# Patient Record
Sex: Female | Born: 1970 | Race: Black or African American | Hispanic: No | Marital: Married | Smoking: Current every day smoker
Health system: Southern US, Community
[De-identification: ages and names within clinical notes are randomized; demographics above are authoritative.]

## PROBLEM LIST (undated history)

## (undated) DIAGNOSIS — I2729 Other secondary pulmonary hypertension: Secondary | ICD-10-CM

## (undated) DIAGNOSIS — J9611 Chronic respiratory failure with hypoxia: Secondary | ICD-10-CM

## (undated) DIAGNOSIS — J449 Chronic obstructive pulmonary disease, unspecified: Secondary | ICD-10-CM

## (undated) DIAGNOSIS — I4891 Unspecified atrial fibrillation: Secondary | ICD-10-CM

## (undated) DIAGNOSIS — Z9981 Dependence on supplemental oxygen: Secondary | ICD-10-CM

## (undated) DIAGNOSIS — D571 Sickle-cell disease without crisis: Secondary | ICD-10-CM

## (undated) DIAGNOSIS — I071 Rheumatic tricuspid insufficiency: Secondary | ICD-10-CM

## (undated) DIAGNOSIS — F17200 Nicotine dependence, unspecified, uncomplicated: Secondary | ICD-10-CM

## (undated) DIAGNOSIS — K6289 Other specified diseases of anus and rectum: Secondary | ICD-10-CM

## (undated) DIAGNOSIS — I509 Heart failure, unspecified: Secondary | ICD-10-CM

## (undated) DIAGNOSIS — J439 Emphysema, unspecified: Secondary | ICD-10-CM

## (undated) DIAGNOSIS — I340 Nonrheumatic mitral (valve) insufficiency: Secondary | ICD-10-CM

## (undated) HISTORY — DX: Dependence on supplemental oxygen: Z99.81

## (undated) HISTORY — DX: Chronic obstructive pulmonary disease, unspecified: J44.9

## (undated) HISTORY — DX: Sickle-cell disease without crisis: D57.1

## (undated) HISTORY — DX: Heart failure, unspecified: I50.9

---

## 1994-12-30 HISTORY — PX: CHOLECYSTECTOMY: SHX55

## 2011-12-31 DIAGNOSIS — I2729 Other secondary pulmonary hypertension: Secondary | ICD-10-CM

## 2011-12-31 HISTORY — DX: Other secondary pulmonary hypertension: I27.29

## 2012-06-09 ENCOUNTER — Emergency Department: Payer: Self-pay | Admitting: Emergency Medicine

## 2012-06-09 LAB — CBC
MCH: 37.6 pg — ABNORMAL HIGH (ref 26.0–34.0)
MCHC: 32.8 g/dL (ref 32.0–36.0)
MCV: 115 fL — ABNORMAL HIGH (ref 80–100)
Platelet: 542 10*3/uL — ABNORMAL HIGH (ref 150–440)
RBC: 3.59 10*6/uL — ABNORMAL LOW (ref 3.80–5.20)
RDW: 18.2 % — ABNORMAL HIGH (ref 11.5–14.5)
WBC: 19.4 10*3/uL — ABNORMAL HIGH (ref 3.6–11.0)

## 2012-06-09 LAB — URINALYSIS, COMPLETE
Bacteria: NONE SEEN
Bilirubin,UR: NEGATIVE
Blood: NEGATIVE
Glucose,UR: NEGATIVE mg/dL (ref 0–75)
Hyaline Cast: 1
Ketone: NEGATIVE
Ph: 6 (ref 4.5–8.0)
Protein: NEGATIVE
Specific Gravity: 1.006 (ref 1.003–1.030)
Squamous Epithelial: 1

## 2012-06-09 LAB — COMPREHENSIVE METABOLIC PANEL
Albumin: 4.2 g/dL (ref 3.4–5.0)
Alkaline Phosphatase: 111 U/L (ref 50–136)
Anion Gap: 9 (ref 7–16)
BUN: 5 mg/dL — ABNORMAL LOW (ref 7–18)
Bilirubin,Total: 1.5 mg/dL — ABNORMAL HIGH (ref 0.2–1.0)
Chloride: 107 mmol/L (ref 98–107)
Co2: 22 mmol/L (ref 21–32)
EGFR (African American): >60
EGFR (Non-African Amer.): >60
Glucose: 98 mg/dL (ref 65–99)
Osmolality: 273 (ref 275–301)
Potassium: 3.4 mmol/L — ABNORMAL LOW (ref 3.5–5.1)
SGPT (ALT): 26 U/L
Sodium: 138 mmol/L (ref 136–145)
Total Protein: 7.6 g/dL (ref 6.4–8.2)

## 2012-06-09 LAB — PREGNANCY, URINE: Pregnancy Test, Urine: NEGATIVE m[IU]/mL

## 2012-06-23 ENCOUNTER — Ambulatory Visit: Payer: Self-pay

## 2012-07-01 ENCOUNTER — Ambulatory Visit: Payer: Self-pay

## 2013-12-13 ENCOUNTER — Ambulatory Visit: Payer: Self-pay

## 2014-09-26 ENCOUNTER — Emergency Department: Payer: Self-pay | Admitting: Emergency Medicine

## 2014-09-26 LAB — CBC WITH DIFFERENTIAL/PLATELET
EOS PCT: 2 %
HCT: 45.1 % (ref 35.0–47.0)
HGB: 14.9 g/dL (ref 12.0–16.0)
Lymphocytes: 28 %
MCH: 38.3 pg — ABNORMAL HIGH (ref 26.0–34.0)
MCHC: 33.1 g/dL (ref 32.0–36.0)
MCV: 116 fL — ABNORMAL HIGH (ref 80–100)
Monocytes: 4 %
NRBC/100 WBC: 1 /
Platelet: 449 10*3/uL — ABNORMAL HIGH (ref 150–440)
RBC: 3.9 10*6/uL (ref 3.80–5.20)
RDW: 16.7 % — ABNORMAL HIGH (ref 11.5–14.5)
Segmented Neutrophils: 64 %
Variant Lymphocyte - H1-Rlymph: 2 %
WBC: 18 10*3/uL — AB (ref 3.6–11.0)

## 2014-09-26 LAB — COMPREHENSIVE METABOLIC PANEL
AST: 19 U/L (ref 15–37)
Albumin: 4 g/dL (ref 3.4–5.0)
Alkaline Phosphatase: 95 U/L
Anion Gap: 9 (ref 7–16)
BUN: 5 mg/dL — AB (ref 7–18)
Bilirubin,Total: 0.9 mg/dL (ref 0.2–1.0)
Calcium, Total: 8.8 mg/dL (ref 8.5–10.1)
Chloride: 109 mmol/L — ABNORMAL HIGH (ref 98–107)
Co2: 24 mmol/L (ref 21–32)
Creatinine: 0.56 mg/dL — ABNORMAL LOW (ref 0.60–1.30)
EGFR (Non-African Amer.): >60
Glucose: 89 mg/dL (ref 65–99)
Osmolality: 280 (ref 275–301)
Potassium: 3.7 mmol/L (ref 3.5–5.1)
SGPT (ALT): 20 U/L
SODIUM: 142 mmol/L (ref 136–145)
Total Protein: 7.5 g/dL (ref 6.4–8.2)

## 2014-09-26 LAB — URINALYSIS, COMPLETE
Bacteria: NONE SEEN
Bilirubin,UR: NEGATIVE
Blood: NEGATIVE
GLUCOSE, UR: NEGATIVE mg/dL (ref 0–75)
KETONE: NEGATIVE
Leukocyte Esterase: NEGATIVE
Nitrite: NEGATIVE
Ph: 6 (ref 4.5–8.0)
Protein: NEGATIVE
Specific Gravity: 1.009 (ref 1.003–1.030)
Squamous Epithelial: 2
WBC UR: NONE SEEN /HPF (ref 0–5)

## 2014-09-26 LAB — LIPASE, BLOOD: LIPASE: 143 U/L (ref 73–393)

## 2015-06-13 ENCOUNTER — Encounter: Payer: Self-pay | Admitting: General Surgery

## 2015-06-21 ENCOUNTER — Ambulatory Visit (INDEPENDENT_AMBULATORY_CARE_PROVIDER_SITE_OTHER): Payer: 59 | Admitting: General Surgery

## 2015-06-21 ENCOUNTER — Encounter: Payer: Self-pay | Admitting: General Surgery

## 2015-06-21 VITALS — BP 112/82 | HR 92 | Resp 18 | Ht 60.0 in | Wt 182.0 lb

## 2015-06-21 DIAGNOSIS — K6289 Other specified diseases of anus and rectum: Secondary | ICD-10-CM

## 2015-06-21 DIAGNOSIS — R109 Unspecified abdominal pain: Secondary | ICD-10-CM

## 2015-06-21 LAB — POC HEMOCCULT BLD/STL (OFFICE/1-CARD/DIAGNOSTIC): Fecal Occult Blood, POC: NEGATIVE

## 2015-06-21 NOTE — Patient Instructions (Addendum)
The patient is aware to call back for any questions or concerns.  Endorectal Ultrasound.

## 2015-06-21 NOTE — Progress Notes (Signed)
Patient ID: Shannon Schneider, female   DOB: [birth year], 44 y.o.   MRN: [id number]  Chief Complaint  Patient presents with  . Other    abnormal CT    HPI Shannon Schneider is a 44 y.o. female.  Here today to discuss an abnormal CT. She had left lower quadrant pain on/off since 2013. She does state that the pain is worse when her period ends. The pain last about 1-3 days. Radiates to her back. She states there is a lot of pressure. If the pain last 2 or more days she does have nausea with vomiting. She is normally constipated but the fiber supplements has helped. Bowels move 3/week, no bleeding. She has been to the ED on several occasions. 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 Follow ed by Dr Laurey Morale, ultrasound pending.   HPI  Past Medical History  Diagnosis Date  . Sickle cell anemia     Past Surgical History  Procedure Laterality Date  . Cesarean section  1989  . Cholecystectomy  1996    Family History  Problem Relation Age of Onset  . Diabetes Mother   . Hypertension Mother     Social History History  Substance Use Topics  . Smoking status: Current Every Day Smoker -- 1.00 packs/day for 20 years    Types: Cigarettes  . Smokeless tobacco: Never Used  . Alcohol Use: 0.0 oz/week    0 Standard drinks or equivalent per week     Comment: occasionally    No Known Allergies  Current Outpatient Prescriptions  Medication Sig Dispense Refill  . budesonide-formoterol (SYMBICORT) 160-4.5 MCG/ACT inhaler Inhale 2 puffs into the lungs 2 (two) times daily.    Marland Kitchen dicyclomine (BENTYL) 10 MG capsule     . FIBER PO Take by mouth daily.    . Multiple Vitamin (MULTIVITAMIN) capsule Take 1 capsule by mouth daily.    . predniSONE (DELTASONE) 10 MG tablet as directed.     . polyethylene glycol powder (GLYCOLAX/MIRALAX) powder 255 grams one bottle for colonoscopy prep 255 g 0   No current facility-administered medications for this visit.    Review of Systems Review  of Systems  Constitutional: Negative.   HENT: Negative.   Eyes: Negative.   Respiratory: Negative.   Cardiovascular: Negative.   Gastrointestinal: Positive for nausea, vomiting and abdominal pain. Negative for diarrhea and constipation.  Endocrine: Negative.   Genitourinary: Negative for difficulty urinating.  Allergic/Immunologic: Negative.   Neurological: Negative.   Hematological: Negative.   Psychiatric/Behavioral: Negative.     Blood pressure 112/82, pulse 92, resp. rate 18, height 5' (1.524 m), weight 182 lb (82.555 kg), last menstrual period 06/12/2015.  Physical Exam Physical Exam  Constitutional: She is oriented to person, place, and time. She appears well-developed and well-nourished.  HENT:  Mouth/Throat: Oropharynx is clear and moist.  Eyes: Conjunctivae are normal. No scleral icterus.  Neck: Neck supple.  Cardiovascular: Normal rate, regular rhythm and normal heart sounds.   Pulses:      Dorsalis pedis pulses are 2+ on the right side, and 2+ on the left side.       Posterior tibial pulses are 2+ on the right side, and 2+ on the left side.  Lower leg edema  Pulmonary/Chest: Effort normal and breath sounds normal.  Abdominal: Soft. Normal appearance and bowel sounds are normal. There is no hepatomegaly. There is no tenderness. A hernia is present.  Umbilical hernia  Genitourinary:  Lymphadenopathy:    She has no cervical adenopathy.       Right: No inguinal adenopathy present.       Left: No inguinal adenopathy present.  Neurological: She is alert and oriented to person, place, and time.  Skin: Skin is warm and dry.  Psychiatric: She has a normal mood and affect.    Data Reviewed CT of the abdomen and pelvis completed [facility] in 2013 was reviewed. The patient has a significantly enlarged uterus consistent with leiomyoma as well as bilateral adnexal masses. Retrospective review of the perirectal area on the right suggests is an ill-defined fullness less  prominent than on her current exam.  CT of the abdomen and pelvis dated 05/29/2015 completed for left lower quadrant pain completed at [place] was independently reviewed. There is a well-defined 2.4 x 2.6 x 3.7 cm hypo dense mass to the right of the rectum just above the leave 8 or area. Uterine enlargement was noted. Subcentimeter renal cyst noted.  Assessment    Perirectal mass, likely benign based on clinical exam and radiologic imaging.  History of sickle cell disease with no crisis events in over 10 years in spite of ongoing smoking.    Plan    Arrangements were made for an endorectal ultrasound to better characterize the lesion. It is presently asymptomatic.     Endorectal Ultrasound.  PCP/Ref:  Kasandra Knudsen Dr. Wynona Luna, [place] Gleason 06/22/2015, 6:40 PM

## 2015-06-22 ENCOUNTER — Telehealth: Payer: Self-pay

## 2015-06-22 ENCOUNTER — Other Ambulatory Visit: Payer: Self-pay | Admitting: *Deleted

## 2015-06-22 DIAGNOSIS — R109 Unspecified abdominal pain: Secondary | ICD-10-CM | POA: Insufficient documentation

## 2015-06-22 DIAGNOSIS — K6289 Other specified diseases of anus and rectum: Secondary | ICD-10-CM | POA: Insufficient documentation

## 2015-06-22 MED ORDER — POLYETHYLENE GLYCOL 3350 17 GM/SCOOP PO POWD: ORAL | Status: DC

## 2015-06-22 NOTE — Telephone Encounter (Signed)
  Oncology Nurse Navigator Documentation  Oncology Nurse Navigator Flowsheets 06/22/2015  Referral date to RadOnc/MedOnc 06/22/2015  Navigator Encounter Type Telephone  Coordination of Care EUS  Time Spent with Patient 15   [place] appropriate voicemail left. Will try again Monday to schedule EUS. Date will be 07/13/15

## 2015-06-26 ENCOUNTER — Telehealth: Payer: Self-pay

## 2015-06-26 NOTE — Telephone Encounter (Signed)
EUS scheduled for July 14th. Instructions also mailed to home address

## 2015-06-27 ENCOUNTER — Inpatient Hospital Stay
Admission: EM | Admit: 2015-06-27 | Discharge: 2015-06-29 | DRG: 193 | Disposition: A | Discharge status: Home / Self Care | Payer: Commercial Managed Care - HMO | Attending: Internal Medicine | Admitting: Internal Medicine

## 2015-06-27 ENCOUNTER — Emergency Department: Payer: Commercial Managed Care - HMO

## 2015-06-27 ENCOUNTER — Encounter: Payer: Self-pay | Admitting: Emergency Medicine

## 2015-06-27 DIAGNOSIS — J9601 Acute respiratory failure with hypoxia: Secondary | ICD-10-CM | POA: Diagnosis present

## 2015-06-27 DIAGNOSIS — D571 Sickle-cell disease without crisis: Secondary | ICD-10-CM | POA: Diagnosis present

## 2015-06-27 DIAGNOSIS — Z7951 Long term (current) use of inhaled steroids: Secondary | ICD-10-CM

## 2015-06-27 DIAGNOSIS — J45909 Unspecified asthma, uncomplicated: Secondary | ICD-10-CM | POA: Diagnosis present

## 2015-06-27 DIAGNOSIS — R0600 Dyspnea, unspecified: Secondary | ICD-10-CM | POA: Diagnosis present

## 2015-06-27 DIAGNOSIS — I340 Nonrheumatic mitral (valve) insufficiency: Secondary | ICD-10-CM | POA: Diagnosis present

## 2015-06-27 DIAGNOSIS — I429 Cardiomyopathy, unspecified: Secondary | ICD-10-CM | POA: Diagnosis present

## 2015-06-27 DIAGNOSIS — J189 Pneumonia, unspecified organism: Principal | ICD-10-CM | POA: Diagnosis present

## 2015-06-27 DIAGNOSIS — J449 Chronic obstructive pulmonary disease, unspecified: Secondary | ICD-10-CM

## 2015-06-27 DIAGNOSIS — J181 Lobar pneumonia, unspecified organism: Secondary | ICD-10-CM

## 2015-06-27 DIAGNOSIS — Z9981 Dependence on supplemental oxygen: Secondary | ICD-10-CM

## 2015-06-27 DIAGNOSIS — R609 Edema, unspecified: Secondary | ICD-10-CM

## 2015-06-27 DIAGNOSIS — F17210 Nicotine dependence, cigarettes, uncomplicated: Secondary | ICD-10-CM | POA: Diagnosis present

## 2015-06-27 DIAGNOSIS — I509 Heart failure, unspecified: Secondary | ICD-10-CM

## 2015-06-27 HISTORY — DX: Dependence on supplemental oxygen: Z99.81

## 2015-06-27 HISTORY — DX: Chronic obstructive pulmonary disease, unspecified: J44.9

## 2015-06-27 HISTORY — DX: Heart failure, unspecified: I50.9

## 2015-06-27 LAB — RETICULOCYTES
RBC.: 3.47 MIL/uL — AB (ref 3.80–5.20)
Retic Count, Absolute: 527.4 10*3/uL — ABNORMAL HIGH (ref 19.0–183.0)
Retic Ct Pct: 15.2 % — ABNORMAL HIGH (ref 0.4–3.1)

## 2015-06-27 LAB — BASIC METABOLIC PANEL
ANION GAP: 8 (ref 5–15)
BUN: 6 mg/dL (ref 6–20)
CO2: 24 mmol/L (ref 22–32)
CREATININE: 0.49 mg/dL (ref 0.44–1.00)
Calcium: 8.7 mg/dL — ABNORMAL LOW (ref 8.9–10.3)
Chloride: 107 mmol/L (ref 101–111)
GFR calc Af Amer: >60 mL/min (ref >60)
GLUCOSE: 152 mg/dL — AB (ref 65–99)
Potassium: 3.3 mmol/L — ABNORMAL LOW (ref 3.5–5.1)
Sodium: 139 mmol/L (ref 135–145)

## 2015-06-27 LAB — URINALYSIS COMPLETE WITH MICROSCOPIC (ARMC ONLY)
Bilirubin Urine: NEGATIVE
Glucose, UA: NEGATIVE mg/dL
HGB URINE DIPSTICK: NEGATIVE
Ketones, ur: NEGATIVE mg/dL
LEUKOCYTES UA: NEGATIVE
NITRITE: NEGATIVE
PH: 6 (ref 5.0–8.0)
Protein, ur: 100 mg/dL — AB
Specific Gravity, Urine: 1.005 (ref 1.005–1.030)

## 2015-06-27 LAB — HEPATIC FUNCTION PANEL
ALBUMIN: 3.5 g/dL (ref 3.5–5.0)
ALT: 29 U/L (ref 14–54)
AST: 25 U/L (ref 15–41)
Alkaline Phosphatase: 94 U/L (ref 38–126)
BILIRUBIN TOTAL: 3.6 mg/dL — AB (ref 0.3–1.2)
Bilirubin, Direct: 1.3 mg/dL — ABNORMAL HIGH (ref 0.1–0.5)
Indirect Bilirubin: 2.3 mg/dL — ABNORMAL HIGH (ref 0.3–0.9)
Total Protein: 6.5 g/dL (ref 6.5–8.1)

## 2015-06-27 LAB — CBC WITH DIFFERENTIAL/PLATELET
BAND NEUTROPHILS: 0 % (ref 0–10)
Basophils Absolute: 0 10*3/uL (ref 0.0–0.1)
Basophils Relative: 0 % (ref 0–1)
Blasts: 0 %
EOS ABS: 0 10*3/uL (ref 0.0–0.7)
EOS PCT: 0 % (ref 0–5)
HEMATOCRIT: 43 % (ref 35.0–47.0)
HEMOGLOBIN: 13.9 g/dL (ref 12.0–16.0)
LYMPHS PCT: 22 % (ref 12–46)
Lymphs Abs: 3.5 10*3/uL (ref 1.0–3.6)
MCH: 40 pg — ABNORMAL HIGH (ref 26.0–34.0)
MCHC: 32.3 g/dL (ref 32.0–36.0)
MCV: 124 fL — AB (ref 80.0–100.0)
METAMYELOCYTES PCT: 0 %
MONO ABS: 0.6 10*3/uL (ref 0.2–0.9)
MONOS PCT: 4 % (ref 3–12)
Myelocytes: 0 %
NRBC: 103 /100{WBCs} — AB
Neutro Abs: 11.8 10*3/uL — ABNORMAL HIGH (ref 1.4–6.5)
Neutrophils Relative %: 74 % (ref 43–77)
Platelets: 281 10*3/uL (ref 150–440)
Promyelocytes Absolute: 0 %
RBC: 3.47 MIL/uL — AB (ref 3.80–5.20)
RDW: 21.7 % — AB (ref 11.5–14.5)
WBC: 16 10*3/uL — ABNORMAL HIGH (ref 4.0–10.5)

## 2015-06-27 LAB — LIPASE, BLOOD: Lipase: 51 U/L (ref 22–51)

## 2015-06-27 LAB — PATHOLOGIST SMEAR REVIEW

## 2015-06-27 MED ORDER — ONDANSETRON HCL 4 MG PO TABS: 4.0000 mg | ORAL_TABLET | Freq: Four times a day (QID) | ORAL | Status: DC | PRN

## 2015-06-27 MED ORDER — ALBUTEROL SULFATE (2.5 MG/3ML) 0.083% IN NEBU: 3.0000 mL | INHALATION_SOLUTION | RESPIRATORY_TRACT | Status: DC | PRN

## 2015-06-27 MED ORDER — IPRATROPIUM-ALBUTEROL 0.5-2.5 (3) MG/3ML IN SOLN: 3.0000 mL | RESPIRATORY_TRACT | Status: DC | PRN

## 2015-06-27 MED ORDER — CEFTRIAXONE SODIUM IN DEXTROSE 20 MG/ML IV SOLN
1.0000 g | Freq: Once | INTRAVENOUS | Status: AC
Start: 2015-06-27 — End: 2015-06-27
  Administered 2015-06-27: 1 g via INTRAVENOUS

## 2015-06-27 MED ORDER — IPRATROPIUM-ALBUTEROL 0.5-2.5 (3) MG/3ML IN SOLN
3.0000 mL | Freq: Once | RESPIRATORY_TRACT | Status: AC
  Administered 2015-06-27: 3 mL via RESPIRATORY_TRACT

## 2015-06-27 MED ORDER — DEXTROSE 5 % IV SOLN
500.0000 mg | Freq: Once | INTRAVENOUS | Status: AC
  Administered 2015-06-27: 500 mg via INTRAVENOUS

## 2015-06-27 MED ORDER — ONDANSETRON HCL 4 MG/2ML IJ SOLN: 4.0000 mg | Freq: Four times a day (QID) | INTRAMUSCULAR | Status: DC | PRN

## 2015-06-27 MED ORDER — LEVOFLOXACIN IN D5W 750 MG/150ML IV SOLN
750.0000 mg | INTRAVENOUS | Status: DC
  Administered 2015-06-28 – 2015-06-29 (×2): 750 mg via INTRAVENOUS
  Filled 2015-06-27 (×3): qty 150

## 2015-06-27 MED ORDER — SODIUM CHLORIDE 0.9 % IV BOLUS (SEPSIS)
1000.0000 mL | Freq: Once | INTRAVENOUS | Status: AC
  Administered 2015-06-27: 1000 mL via INTRAVENOUS

## 2015-06-27 MED ORDER — CEFTRIAXONE SODIUM IN DEXTROSE 20 MG/ML IV SOLN
INTRAVENOUS | Status: AC
  Administered 2015-06-27: 1 g via INTRAVENOUS
  Filled 2015-06-27: qty 50

## 2015-06-27 MED ORDER — ACETAMINOPHEN 325 MG PO TABS: 650.0000 mg | ORAL_TABLET | Freq: Four times a day (QID) | ORAL | Status: DC | PRN

## 2015-06-27 MED ORDER — ENOXAPARIN SODIUM 40 MG/0.4ML ~~LOC~~ SOLN
40.0000 mg | SUBCUTANEOUS | Status: DC
  Filled 2015-06-27: qty 0.4

## 2015-06-27 MED ORDER — ACETAMINOPHEN 650 MG RE SUPP: 650.0000 mg | Freq: Four times a day (QID) | RECTAL | Status: DC | PRN

## 2015-06-27 MED ORDER — IPRATROPIUM-ALBUTEROL 0.5-2.5 (3) MG/3ML IN SOLN
RESPIRATORY_TRACT | Status: AC
  Administered 2015-06-27: 3 mL via RESPIRATORY_TRACT
  Filled 2015-06-27: qty 9

## 2015-06-27 MED ORDER — DEXTROSE 5 % IV SOLN
INTRAVENOUS | Status: AC
  Administered 2015-06-27: 500 mg via INTRAVENOUS
  Filled 2015-06-27: qty 500

## 2015-06-27 NOTE — ED Notes (Signed)
Pt to ed with c/o sob x 2 weeks, reports low o2 levels at PMD office.  Pt reports she has been feeling worse each day. +smoker, +sickle cell

## 2015-06-27 NOTE — ED Provider Notes (Signed)
[facility] Emergency Department Provider Note  ____________________________________________  Time seen: 8:05 AM  I have reviewed the triage vital signs and the nursing notes.   HISTORY  Chief Complaint Shortness of Breath    HPI Shannon Schneider is a 44 y.o. female who complains of shortness of breath for 2 weeks. She denies any chest pain abdominal pain back pain or lower extremity pain. She notes that she's been going to her primary care doctor approximately monthly due to her sickle cell disease. Normally her sickle cell crises present with pain primarily in the lower extremities with shortness of breath and chest pain. This does not feel like those prior sickle cell crises. She also notes that over the past week her oxygen saturation has been very low. She's been back and forth to her primary care doctor's office who started her on 3 inhalers that have not improved her symptoms. Her oxygen saturation was in the 70s at her PCP clinic. Her shortness of breath is still persisting and not improved. She does not have any fever or cough at this time. Denies recent runny nose sore throat or congestion.     Past Medical History  Diagnosis Date  . Sickle cell anemia     There are no active problems to display for this patient.   History reviewed. No pertinent past surgical history. Cholecystectomy Current Outpatient Rx  Name  Route  Sig  Dispense  Refill  . levofloxacin (LEVAQUIN) 500 MG tablet   Oral   Take 1 tablet by mouth daily.         . predniSONE (DELTASONE) 10 MG tablet               . VENTOLIN HFA 108 (90 BASE) MCG/ACT inhaler                 Dispense as written.     Allergies Review of patient's allergies indicates no known allergies.  History reviewed. No pertinent family history.  Social History History  Substance Use Topics  . Smoking status: Current Every Day Smoker  . Smokeless tobacco: Not on file  . Alcohol Use: Yes     Review of Systems  Constitutional: No fever or chills. No weight changes Eyes:No blurry vision or double vision.  ENT: No sore throat. Cardiovascular: No chest pain. Respiratory: Dyspnea as above Gastrointestinal: Negative for abdominal pain, vomiting and diarrhea.  No BRBPR or melena. Genitourinary: Negative for dysuria, urinary retention, bloody urine, or difficulty urinating. Musculoskeletal: Negative for back pain. No joint swelling or pain. Skin: Negative for rash. Neurological: Negative for headaches, focal weakness or numbness. Psychiatric:No anxiety or depression.   Endocrine:No hot/cold intolerance, changes in energy, or sleep difficulty.  10-point ROS otherwise negative.  ____________________________________________   PHYSICAL EXAM:  VITAL SIGNS: ED Triage Vitals  Enc Vitals Group     BP 06/27/15 0745 135/92 mmHg     Pulse Rate 06/27/15 0745 113     Resp 06/27/15 0745 28     Temp 06/27/15 0745 98.2 F (36.8 C)     Temp Source 06/27/15 0745 Oral     SpO2 06/27/15 0745 78 % room air      Weight 06/27/15 0745 182 lb (82.555 kg)     Height 06/27/15 0745 5' (1.524 m)     Head Cir --      Peak Flow --      Pain Score 06/27/15 0746 0     Pain Loc --  Pain Edu? --      Excl. in GC? --   Oxygen saturation 92-94% on 2 L nasal cannula   Constitutional: Alert and oriented. Well appearing and in no distress. Eyes: No scleral icterus. No conjunctival pallor. PERRL. EOMI ENT   Head: Normocephalic and atraumatic.   Nose: No congestion/rhinnorhea. No septal hematoma   Mouth/Throat: MMM, no pharyngeal erythema. No peritonsillar mass. No uvula shift.   Neck: No stridor. No SubQ emphysema. No meningismus. Hematological/Lymphatic/Immunilogical: No cervical lymphadenopathy. Cardiovascular: RRR. Normal and symmetric distal pulses are present in all extremities. No murmurs, rubs, or gallops. Respiratory: Tachypnea. Right basilar  rhonchi.. Gastrointestinal: Epigastric tenderness. No distention. There is no CVA tenderness.  No rebound, rigidity, or guarding. Genitourinary: deferred Musculoskeletal: Nontender with normal range of motion in all extremities. No joint effusions.  No lower extremity tenderness.  No edema. Neurologic:   Normal speech and language.  CN 2-10 normal. Motor grossly intact. No pronator drift.  Normal gait. No gross focal neurologic deficits are appreciated.  Skin:  Skin is warm, dry and intact. No rash noted.  No petechiae, purpura, or bullae. Psychiatric: Mood and affect are normal. Speech and behavior are normal. Patient exhibits appropriate insight and judgment.  ____________________________________________    LABS (pertinent positives/negatives) (all labs ordered are listed, but only abnormal results are displayed) Labs Reviewed  URINALYSIS COMPLETEWITH MICROSCOPIC ([facility] ONLY) - Abnormal; Notable for the following:    Color, Urine YELLOW (*)    APPearance CLEAR (*)    Protein, ur 100 (*)    Bacteria, UA RARE (*)    Squamous Epithelial / LPF 6-30 (*)    All other components within normal limits  BASIC METABOLIC PANEL - Abnormal; Notable for the following:    Potassium 3.3 (*)    Glucose, Bld 152 (*)    Calcium 8.7 (*)    All other components within normal limits  HEPATIC FUNCTION PANEL - Abnormal; Notable for the following:    Total Bilirubin 3.6 (*)    Bilirubin, Direct 1.3 (*)    Indirect Bilirubin 2.3 (*)    All other components within normal limits  CBC WITH DIFFERENTIAL/PLATELET - Abnormal; Notable for the following:    WBC 16.0 (*)    RBC 3.47 (*)    MCV 124.0 (*)    MCH 40.0 (*)    RDW 21.7 (*)    nRBC 103 (*)    Neutro Abs 11.8 (*)    All other components within normal limits  RETICULOCYTES - Abnormal; Notable for the following:    Retic Ct Pct 15.2 (*)    RBC. 3.47 (*)    Retic Count, Manual 527.4 (*)    All other components within normal limits  LIPASE,  BLOOD  PATHOLOGIST SMEAR REVIEW   ____________________________________________   EKG  Interpreted by me Sinus tachycardia rate 102, normal axis, normal intervals. Poor R-wave progression in anterior precordial leads. There are 2 premature supraventricular complexes on the 10 second strip. Normal ST segments, inverted T waves in V3 through V6.  ____________________________________________    RADIOLOGY  Chest x-ray shows bibasilar infiltrate worse on the right with small pleural effusion.  ____________________________________________   PROCEDURES CRITICAL CARE Performed by: Joni Fears, Jocabed Cheese   Total critical care time: 35 minutes  Critical care time was exclusive of separately billable procedures and treating other patients.  Critical care was necessary to treat or prevent imminent or life-threatening deterioration.  Critical care was time spent personally by me on the following activities: development  of treatment plan with patient and/or surrogate as well as nursing, discussions with consultants, evaluation of patient's response to treatment, examination of patient, obtaining history from patient or surrogate, ordering and performing treatments and interventions, ordering and review of laboratory studies, ordering and review of radiographic studies, pulse oximetry and re-evaluation of patient's condition.  ____________________________________________   INITIAL IMPRESSION / ASSESSMENT AND PLAN / ED COURSE  Pertinent labs & imaging results that were available during my care of the patient were reviewed by me and considered in my medical decision making (see chart for details).  Patient presents with shortness of breath and hypoxia along with tachypnea and tachycardia. She is improved with supplemental oxygen. We'll check a chest x-ray and labs, give DuoNeb and IV fluids for symptom relief. If x-ray is not significant for a pneumonia, we will proceed with CT scan of the chest  to evaluate for PE. We'll plan on admitting the patient due to her acute hypoxic respiratory failure. ----------------------------------------- 11:38 AM on 06/27/2015 -----------------------------------------  Vitals improved. Chest x-ray consistent with pneumonia with infiltrate at the right base. With initial hypoxia tachypnea and tachycardia and leukocytosis, patient has multiple SIRS criteria and evidence of pneumonia consistent with sepsis. The patient is given IV ceftriaxone and azithromycin and IV fluids. I discussed the case with the hospitalist who will evaluate the patient for admission. ____________________________________________   FINAL CLINICAL IMPRESSION(S) / ED DIAGNOSES  Final diagnoses:  Right lower lobe pneumonia  Acute respiratory failure with hypoxia      Carrie Mew, MD 06/27/15 1139

## 2015-06-27 NOTE — H&P (Signed)
[place] [facility] [place] NAME: Shannon Schneider    MR#:  [id number]  DATE OF BIRTH:  1971-07-04  DATE OF ADMISSION:  06/27/2015  PRIMARY CARE PHYSICIAN: No PCP Per Patient   REQUESTING/REFERRING PHYSICIAN: Dr. Brenton Grills  CHIEF COMPLAINT:   Chief Complaint  Patient presents with  . Shortness of Breath     HISTORY OF PRESENT ILLNESS:  Shannon Schneider  is a 44 y.o. female with a known history of sickle cell anemia who presents to the hospital worsening shortness of breath over the past 2 weeks. Patient describes the shortness of breath mostly exertional in nature and is progressively getting worse. Patient denies any cough, sputum production, fevers, chills. She denies any recent sick contacts. She denies any not nausea vomiting abdominal pain or any other associated symptoms presently. Patient presented to the emergency room was noted to be hypoxic with O2 sats in the high 70s and had chest x-ray findings suggestive of pneumonia. Hospitalist services were contacted for further treatment and evaluation.  PAST MEDICAL HISTORY:   Past Medical History  Diagnosis Date  . Sickle cell anemia     PAST SURGICAL HISTORY:  History reviewed. No pertinent past surgical history.  SOCIAL HISTORY:   History  Substance Use Topics  . Smoking status: Current Every Day Smoker -- 1.00 packs/day for 20 years    Types: Cigarettes  . Smokeless tobacco: Not on file  . Alcohol Use: Yes    FAMILY HISTORY:   Family History  Problem Relation Age of Onset  . Renal Disease Mother   . Diabetes Mother   . Hypertension Mother   . Emphysema Father     DRUG ALLERGIES:  No Known Allergies  REVIEW OF SYSTEMS:   Review of Systems  Constitutional: Negative for fever and weight loss.  HENT: Negative for congestion, nosebleeds and tinnitus.   Eyes: Negative for blurred vision, double vision and redness.  Respiratory: Positive for shortness of breath  (exertional). Negative for cough and hemoptysis.   Cardiovascular: Negative for chest pain, orthopnea, leg swelling and PND.  Gastrointestinal: Negative for nausea, vomiting, abdominal pain, diarrhea and melena.  Genitourinary: Negative for dysuria, urgency and hematuria.  Musculoskeletal: Negative for joint pain and falls.  Neurological: Negative for dizziness, tingling, sensory change, focal weakness, seizures, weakness and headaches.  Endo/Heme/Allergies: Negative for polydipsia. Does not bruise/bleed easily.  Psychiatric/Behavioral: Negative for depression and memory loss. The patient is not nervous/anxious.     MEDICATIONS AT HOME:   Prior to Admission medications   Medication Sig Start Date End Date Taking? Authorizing Provider  levofloxacin (LEVAQUIN) 500 MG tablet Take 1 tablet by mouth daily. 06/26/15  Yes Historical Provider, MD  predniSONE (DELTASONE) 10 MG tablet  06/12/15  Yes Historical Provider, MD  VENTOLIN HFA 108 (90 BASE) MCG/ACT inhaler  06/23/15  Yes Historical Provider, MD      VITAL SIGNS:  Blood pressure 124/92, pulse 80, temperature 98.2 F (36.8 C), temperature source Oral, resp. rate 16, height 5' (1.524 m), weight 82.555 kg (182 lb), last menstrual period 06/12/2015, SpO2 91 %.  PHYSICAL EXAMINATION:  Physical Exam  GENERAL:  44 y.o.-year-old patient lying in the bed in mild respiratory distress.  EYES: Pupils equal, round, reactive to light and accommodation. No scleral icterus. Extraocular muscles intact.  HEENT: Head atraumatic, normocephalic. Oropharynx and nasopharynx clear. No oropharyngeal erythema, moist oral mucosa  NECK:  Supple, no jugular venous distention. No thyroid enlargement, no tenderness.  LUNGS:  Normal breath sounds bilaterally, no wheezing, rales, rhonchi. No use of accessory muscles of respiration.  CARDIOVASCULAR: S1, S2 RRR. No murmurs, rubs, gallops, clicks.  ABDOMEN: Soft, nontender, nondistended. Bowel sounds present. No  organomegaly or mass.  EXTREMITIES: No pedal edema, cyanosis, or clubbing. + 2 pedal & radial pulses b/l.   NEUROLOGIC: Cranial nerves II through XII are intact. No focal Motor or sensory deficits appreciated b/l PSYCHIATRIC: The patient is alert and oriented x 3. Good affect.  SKIN: No obvious rash, lesion, or ulcer.   LABORATORY PANEL:   CBC  Recent Labs Lab 06/27/15 0807  WBC 16.0*  HGB 13.9  HCT 43.0  PLT 281   ------------------------------------------------------------------------------------------------------------------  Chemistries   Recent Labs Lab 06/27/15 0807  NA 139  K 3.3*  CL 107  CO2 24  GLUCOSE 152*  BUN 6  CREATININE 0.49  CALCIUM 8.7*  AST 25  ALT 29  ALKPHOS 94  BILITOT 3.6*   ------------------------------------------------------------------------------------------------------------------  Cardiac Enzymes No results for input(s): TROPONINI in the last 168 hours. ------------------------------------------------------------------------------------------------------------------  RADIOLOGY:  Dg Chest 2 View  06/27/2015   CLINICAL DATA:  Shortness of breath for 2 weeks with hypoxia, initial encounter  EXAM: CHEST - 2 VIEW  COMPARISON:  None.  FINDINGS: Cardiac shadow is mildly enlarged. The lungs are well aerated with bibasilar changes right greater than left with associated small right-sided pleural effusion.  IMPRESSION: Bibasilar atelectasis/ infiltrate with right-sided effusion.   Electronically Signed   By: Inez Catalina M.D.   On: 06/27/2015 10:31     IMPRESSION AND PLAN:   59-year-old female with past medical history of sickle cell anemia presents to the hospital with shortness of breath progressively getting worse over the past 2 weeks and noted to have a pneumonia.   #1 pneumonia-this is likely acute community acquired pneumonia. -We'll go ahead and start treatment with IV Levaquin, follow blood and sputum cultures and follow patient  clinically. -Patient is presently afebrile and hemodynamically stable.  #2 leukocytosis-likely steroid mediated patient has just finished a prednisone taper. We'll follow white cell count with IV antibiotic therapy.  #3 sickle cell anemia-patient's hemoglobin is stable. She has no evidence of acute sickle cell crisis. -We'll follow clinically.  #4 tobacco abuse-patient refused nicotine patch.   All the records are reviewed and case discussed with ED provider. Management plans discussed with the patient, family and they are in agreement.  CODE STATUS: Full  TOTAL TIME TAKING CARE OF THIS PATIENT: 45 minutes.    Henreitta Leber M.D on 06/27/2015 at 12:04 PM  Between 7am to 6pm - Pager - [phone]  After 6pm go to [email or web address] - password [email or web address]  [place] [facility]  Office  [phone]  CC: Primary care physician; No PCP Per Patient

## 2015-06-27 NOTE — ED Notes (Signed)
Per MD ( Dr. Joni Fears) no blood cultures are needed before administering IV antibotics

## 2015-06-28 ENCOUNTER — Telehealth: Payer: Self-pay | Admitting: *Deleted

## 2015-06-28 ENCOUNTER — Other Ambulatory Visit: Payer: Self-pay | Admitting: *Deleted

## 2015-06-28 ENCOUNTER — Other Ambulatory Visit: Payer: Self-pay | Admitting: General Surgery

## 2015-06-28 ENCOUNTER — Inpatient Hospital Stay: Payer: Commercial Managed Care - HMO

## 2015-06-28 ENCOUNTER — Inpatient Hospital Stay
Admit: 2015-06-28 | Discharge: 2015-06-28 | Disposition: A | Discharge status: Home / Self Care | Payer: Commercial Managed Care - HMO | Attending: Internal Medicine | Admitting: Internal Medicine

## 2015-06-28 DIAGNOSIS — K6289 Other specified diseases of anus and rectum: Secondary | ICD-10-CM

## 2015-06-28 DIAGNOSIS — R1084 Generalized abdominal pain: Secondary | ICD-10-CM

## 2015-06-28 DIAGNOSIS — K629 Disease of anus and rectum, unspecified: Secondary | ICD-10-CM

## 2015-06-28 LAB — BASIC METABOLIC PANEL
Anion gap: 6 (ref 5–15)
BUN: 5 mg/dL — ABNORMAL LOW (ref 6–20)
CHLORIDE: 110 mmol/L (ref 101–111)
CO2: 26 mmol/L (ref 22–32)
CREATININE: 0.49 mg/dL (ref 0.44–1.00)
Calcium: 8.7 mg/dL — ABNORMAL LOW (ref 8.9–10.3)
GFR calc non Af Amer: >60 mL/min (ref >60)
Glucose, Bld: 107 mg/dL — ABNORMAL HIGH (ref 65–99)
Potassium: 3.6 mmol/L (ref 3.5–5.1)
SODIUM: 142 mmol/L (ref 135–145)

## 2015-06-28 LAB — CBC
HCT: 40.9 % (ref 35.0–47.0)
Hemoglobin: 13.2 g/dL (ref 12.0–16.0)
MCH: 40.2 pg — ABNORMAL HIGH (ref 26.0–34.0)
MCHC: 32.2 g/dL (ref 32.0–36.0)
MCV: 124.6 fL — AB (ref 80.0–100.0)
PLATELETS: 263 10*3/uL (ref 150–440)
RBC: 3.28 MIL/uL — ABNORMAL LOW (ref 3.80–5.20)
RDW: 22.6 % — ABNORMAL HIGH (ref 11.5–14.5)
WBC: 14 10*3/uL — ABNORMAL HIGH (ref 3.6–11.0)

## 2015-06-28 MED ORDER — FUROSEMIDE 10 MG/ML IJ SOLN
40.0000 mg | Freq: Once | INTRAMUSCULAR | Status: AC
  Administered 2015-06-28: 40 mg via INTRAVENOUS
  Filled 2015-06-28 (×2): qty 4

## 2015-06-28 MED ORDER — METHYLPREDNISOLONE SODIUM SUCC 40 MG IJ SOLR
40.0000 mg | Freq: Four times a day (QID) | INTRAMUSCULAR | Status: DC
  Administered 2015-06-28 – 2015-06-29 (×5): 40 mg via INTRAVENOUS
  Filled 2015-06-28 (×6): qty 1

## 2015-06-28 NOTE — Progress Notes (Signed)
Patient has had no complaints today. Trying to wean her off of O2, but she de-sats to 84-85% upon exertion. When at rest 2L keeps her stats at 90%. Continue to monitor.

## 2015-06-28 NOTE — Telephone Encounter (Signed)
Message for patient to call the office.   We need to review instructions for lower endoscopic ultrasound that is scheduled for 07-13-15 at [facility]. Miralax prescription has already been sent in to patient's pharmacy.

## 2015-06-28 NOTE — Progress Notes (Signed)
[facility] [place] NAME: Shannon Schneider    MR#:  [id number]  DATE OF BIRTH:  Aug 12, 1971  SUBJECTIVE:  Patient is doing better this morning. Patient denies shortness of breath. Patient does have lower extremity edema which she says started when she started the steroids that her primary care physician had prescribed to her. Patient does not have a diagnosis of COPD. Patient is a long-term smoker.  REVIEW OF SYSTEMS:    Review of Systems  Constitutional: Negative for fever, chills and malaise/fatigue.  HENT: Negative for sore throat.   Eyes: Negative for blurred vision.  Respiratory: Negative for cough, hemoptysis, shortness of breath and wheezing.   Cardiovascular: Positive for leg swelling. Negative for chest pain and palpitations.  Gastrointestinal: Negative for nausea, vomiting, abdominal pain, diarrhea and blood in stool.  Genitourinary: Negative for dysuria.  Musculoskeletal: Negative for back pain.  Neurological: Negative for dizziness, tremors and headaches.  Endo/Heme/Allergies: Does not bruise/bleed easily.    Tolerating Diet: Yes      DRUG ALLERGIES:  No Known Allergies  VITALS:  Blood pressure 113/80, pulse 88, temperature 98.1 F (36.7 C), temperature source Oral, resp. rate 16, height 5' (1.524 m), weight 83.553 kg (184 lb 3.2 oz), last menstrual period 06/12/2015, SpO2 93 %.  PHYSICAL EXAMINATION:   Physical Exam  Constitutional: She is oriented to person, place, and time and well-developed, well-nourished, and in no distress. No distress.  HENT:  Head: Normocephalic.  Eyes: No scleral icterus.  Neck: Normal range of motion. Neck supple. No JVD present. No tracheal deviation present.  Cardiovascular: Normal rate, regular rhythm and normal heart sounds.  Exam reveals no gallop and no friction rub.   No murmur heard. Pulmonary/Chest: Effort normal and breath sounds normal. No respiratory distress. She has no  wheezes. She has no rales. She exhibits no tenderness.  decreased BS right base no crackles  Abdominal: Soft. Bowel sounds are normal. She exhibits no distension and no mass. There is no tenderness. There is no rebound and no guarding.  Musculoskeletal: Normal range of motion. She exhibits edema.  Neurological: She is alert and oriented to person, place, and time.  Skin: Skin is warm. No rash noted. No erythema.  Psychiatric: Affect and judgment normal.      LABORATORY PANEL:   CBC  Recent Labs Lab 06/28/15 0408  WBC 14.0*  HGB 13.2  HCT 40.9  PLT 263   ------------------------------------------------------------------------------------------------------------------  Chemistries   Recent Labs Lab 06/27/15 0807 06/28/15 0408  NA 139 142  K 3.3* 3.6  CL 107 110  CO2 24 26  GLUCOSE 152* 107*  BUN 6 5*  CREATININE 0.49 0.49  CALCIUM 8.7* 8.7*  AST 25  --   ALT 29  --   ALKPHOS 94  --   BILITOT 3.6*  --    ------------------------------------------------------------------------------------------------------------------  Cardiac Enzymes No results for input(s): TROPONINI in the last 168 hours. ------------------------------------------------------------------------------------------------------------------  RADIOLOGY:  Dg Chest 2 View  06/27/2015   CLINICAL DATA:  Shortness of breath for 2 weeks with hypoxia, initial encounter  EXAM: CHEST - 2 VIEW  COMPARISON:  None.  FINDINGS: Cardiac shadow is mildly enlarged. The lungs are well aerated with bibasilar changes right greater than left with associated small right-sided pleural effusion.  IMPRESSION: Bibasilar atelectasis/ infiltrate with right-sided effusion.   Electronically Signed   By: Inez Catalina M.D.   On: 06/27/2015 10:31     ASSESSMENT AND PLAN:   44 year old  female with a history of sickle cell anemia tobacco dependence who presented with shortness of breath and found to have a right lower lobe  pneumonia.  1. Acute hypoxic respiratory failure: This is likely secondary to pneumonia seen on chest x-ray. Patient continues to have low oxygenation on room air. I will continue the oxygen and antibiotics for her pneumonia. I will also order a 2-D echocardiogram as patient has lower extreme edema to evaluate her cardiac function and start IV steroids.  2. Community acquired pneumonia: Patient will continue on Levaquin. Wean oxygen as elevated.  3. Sickle cell anemia: Patient's hemoglobin is stable. Patient does not have sickle cell crisis at this time.  4. Tobacco abuse: Patient was counselled for 3 minutes. She does not want a nicotine patch.  5. Lower extremity edema: Right is greater than the left. I will order 2-D echocardiogram to evaluate for CHF. I will also order Doppler of the right lower extremity to evaluate for DVT.  Management plans discussed with the patient and she is in agreement.  CODE STATUS: full  TOTAL TIME TAKING CARE OF THIS PATIENT: 33 minutes.   Greater than 50% counseling and coordination of care  POSSIBLE D/C 1-2 days  , DEPENDING ON CLINICAL CONDITION.   Vieva Brummitt M.D on 06/28/2015 at 10:20 AM  Between 7am to 6pm - Pager - [phone] After 6pm go to [email or web address] - password [email or web address] [facility]  [facility]  Office  [phone]  CC: Primary care physician; No PCP Per Patient

## 2015-06-28 NOTE — Telephone Encounter (Signed)
Patient notified of Miralax prep for lower endoscopic ultrasound. Paperwork will be mailed to the patient.   This patient was instructed to call should she have further questions.

## 2015-06-28 NOTE — Care Management Note (Signed)
Case Management Note  Patient Details  Name: BRAIDEN PRESUTTI MRN: [id number] Date of Birth: 1971/06/05  Subjective/Objective:                  Patient was sitting comfortably in bed with leg hanging off bed while talking on her cell phone. She is wearing O2 which she states is new. She goes to [facility] for medical care. She is independent with daily activities including the ability to drive. She states she has transportation. She uses [facility] [phone] for Rx. She states she lives with her husband.   Action/Plan: RN to assess need for home O2. RNCM will follow for O2 need.   Expected Discharge Date:                  Expected Discharge Plan:     In-House Referral:     Discharge planning Services  CM Consult  Post Acute Care Choice:    Choice offered to:  Patient  DME Arranged:    DME Agency:     HH Arranged:    [place] Agency:     Status of Service:     Medicare Important Message Given:    Date Medicare IM Given:    Medicare IM give by:    Date Additional Medicare IM Given:    Additional Medicare Important Message give by:     If discussed at [place] of Stay Meetings, dates discussed:    Additional Comments:  Marshell Garfinkel, RN 06/28/2015, 8:27 AM

## 2015-06-28 NOTE — Progress Notes (Signed)
*  PRELIMINARY RESULTS* Echocardiogram 2D Echocardiogram has been performed.  Shannon Schneider 06/28/2015, 10:55 AM

## 2015-06-28 NOTE — Progress Notes (Signed)
Patient has been scheduled for a lower endoscopic ultrasound at [facility] for 07-13-15. This is to be completed by Dr. Earlie Counts.   Dr. Hervey Ard is the ordering physician.   The test is being done for a perirectal soft tissue mass and abdominal pain (ICD-10 code: K62.9, R10.9).

## 2015-06-29 LAB — BASIC METABOLIC PANEL
ANION GAP: 7 (ref 5–15)
BUN: 7 mg/dL (ref 6–20)
CO2: 27 mmol/L (ref 22–32)
Calcium: 9.3 mg/dL (ref 8.9–10.3)
Chloride: 107 mmol/L (ref 101–111)
Creatinine, Ser: 0.43 mg/dL — ABNORMAL LOW (ref 0.44–1.00)
GFR calc Af Amer: >60 mL/min (ref >60)
GFR calc non Af Amer: >60 mL/min (ref >60)
Glucose, Bld: 138 mg/dL — ABNORMAL HIGH (ref 65–99)
POTASSIUM: 4 mmol/L (ref 3.5–5.1)
SODIUM: 141 mmol/L (ref 135–145)

## 2015-06-29 MED ORDER — FUROSEMIDE 20 MG PO TABS: 20.0000 mg | ORAL_TABLET | Freq: Every day | ORAL | Status: DC

## 2015-06-29 MED ORDER — NICOTINE 21 MG/24HR TD PT24
21.0000 mg | MEDICATED_PATCH | Freq: Every day | TRANSDERMAL | Status: DC
  Administered 2015-06-29: 21 mg via TRANSDERMAL
  Filled 2015-06-29: qty 1

## 2015-06-29 MED ORDER — PREDNISONE 10 MG (21) PO TBPK: 10.0000 mg | ORAL_TABLET | Freq: Every day | ORAL | Status: DC

## 2015-06-29 MED ORDER — CARVEDILOL 3.125 MG PO TABS: 3.1250 mg | ORAL_TABLET | Freq: Two times a day (BID) | ORAL | Status: DC

## 2015-06-29 MED ORDER — LEVOFLOXACIN 750 MG PO TABS
750.0000 mg | ORAL_TABLET | Freq: Every day | ORAL | Status: DC
Start: 2015-06-29 — End: 2015-07-10

## 2015-06-29 MED ORDER — NICOTINE 21 MG/24HR TD PT24: 21.0000 mg | MEDICATED_PATCH | Freq: Every day | TRANSDERMAL | Status: DC

## 2015-06-29 NOTE — Progress Notes (Signed)
SATURATION QUALIFICATIONS: (This note is used to comply with regulatory documentation for home oxygen)  Patient Saturations on Room Air at Rest = 83%   

## 2015-06-29 NOTE — Care Management (Signed)
Home O2 referral called to Will with [employer]. I have met with patient and explained process. O2 sat assessment needed so that [facility] can check authorization with insurance. Patient advised to follow up with her PCP to monitor continued need.

## 2015-06-29 NOTE — Progress Notes (Signed)
Pt VSS this shift. Will need home oxygen. Patient being discharged this afternoon to home with husband. DIscharge & medication review; iv's removed. Belongings packed.

## 2015-06-29 NOTE — Discharge Summary (Signed)
[facility] at [place] NAME: Shannon Schneider    MR#:  [id number]  DATE OF BIRTH:  08/18/1971  DATE OF ADMISSION:  06/27/2015 ADMITTING PHYSICIAN: Henreitta Leber, MD  DATE OF DISCHARGE: 06/29/2015  PRIMARY CARE PHYSICIAN:[facility]  ADMISSION DIAGNOSIS:  Right lower lobe pneumonia [J18.9] Acute respiratory failure with hypoxia [J96.01]  DISCHARGE DIAGNOSIS:  Active Problems:   Pneumonia   SECONDARY DIAGNOSIS:   Past Medical History  Diagnosis Date  . Sickle cell anemia   . Sickle cell anemia     HOSPITAL COURSE:  This is a 44 year old female with known history of sickle cell anemia who presented with increasing shortness of breath and lower extremity edema over the past 2 weeks. She was noted to have a pneumonia on chest x-ray.  1. Acute respiratory failure, hypoxia: Patient was admitted for continue quite pneumonia. Patient continued to have hypoxia and lower extremity edema. 2-D echocardiogram was ordered which showed cardiomyopathy EF 35-40%. Patient was treated for her pneumonia as well as reactive airway disease with IV steroids. She will need oxygen at discharge. 2. Community-acquired pneumonia: Patient was started on Levaquin. She will be discharged with Levaquin for 5 more days. She is a right lower lobe pneumonia on chest x-ray. Given her history of smoking I think we should repeat a chest x-ray in 6 weeks.  3. Reactive airway disease: Patient may have COPD underlying. Patient does have a follow-up appointment with the pulmonologist. Patient was discharged with by mouth steroids and continued on her inhalers.  4. Cardiomyopathy: Patient underwent 2-D echocardiogram showed ejection fraction of 35-40% as well as moderate mitral regurgitation. Patient was started on low-dose Coreg and Lasix. Patient will have follow-up with cardiology for further evaluation and investigation. I suspect patient will need a cardiac  catheterization once her acute issues have resolved.  5. Sickle cell anemia:  This was stable.   6. Tobacco dependence: Patient was encouraged to stop smoking. Patient was discharged with nicotine patch.  DISCHARGE CONDITIONS AND DIET:  Patient is being discharged home on a heart healthy diet in stable condition  CONSULTS OBTAINED:   none  DRUG ALLERGIES:  No Known Allergies  DISCHARGE MEDICATIONS:   Current Discharge Medication List    START taking these medications   Details  nicotine (NICODERM CQ - DOSED IN MG/24 HOURS) 21 mg/24hr patch Place 1 patch (21 mg total) onto the skin daily. Qty: 28 patch, Refills: 0    predniSONE (STERAPRED UNI-PAK 21 TAB) 10 MG (21) TBPK tablet Take 1 tablet (10 mg total) by mouth daily. Label  & dispense according to the schedule below: 60 mg PO x 2 days 50 mg PO x 2 days 40 mg PO x 2 days 30 mg PO x 2 days 20 mg PO x 2 days 10 mg PO x 2 days then stop Qty: 40 tablet, Refills: 0      CONTINUE these medications which have CHANGED   Details  levofloxacin (LEVAQUIN) 750 MG tablet Take 1 tablet (750 mg total) by mouth daily. Qty: 5 tablet, Refills: 0      CONTINUE these medications which have NOT CHANGED   Details  VENTOLIN HFA 108 (90 BASE) MCG/ACT inhaler       STOP taking these medications     predniSONE (DELTASONE) 10 MG tablet         Patient also being discharged on Coreg 3.125 mg by mouth twice a day Lasix 20 mg daily  Today   CHIEF COMPLAINT:  Patient is doing well this point. Patient denies wheezing, shortness of breath or chest pain.   VITAL SIGNS:  Blood pressure 131/86, pulse 82, temperature 97.5 F (36.4 C), temperature source Oral, resp. rate 18, height 5' (1.524 m), weight 83.553 kg (184 lb 3.2 oz), last menstrual period 06/12/2015, SpO2 90 %.   REVIEW OF SYSTEMS:  Review of Systems  Constitutional: Negative for fever, chills and malaise/fatigue.  HENT: Negative for sore throat.   Eyes: Negative  for blurred vision.  Respiratory: Negative for cough, hemoptysis, shortness of breath and wheezing.   Cardiovascular: Negative for chest pain, palpitations and leg swelling.  Gastrointestinal: Negative for nausea, vomiting, abdominal pain, diarrhea and blood in stool.  Genitourinary: Negative for dysuria.  Musculoskeletal: Negative for back pain.  Neurological: Negative for dizziness, tremors and headaches.  Endo/Heme/Allergies: Does not bruise/bleed easily.     PHYSICAL EXAMINATION:  GENERAL:  44 y.o.-year-old patient lying in the bed with no acute distress.  NECK:  Supple, no jugular venous distention. No thyroid enlargement, no tenderness.  LUNGS: Normal breath sounds bilaterally, no wheezing, rales,rhonchi  No use of accessory muscles of respiration.  CARDIOVASCULAR: S1, S2 normal. 2/6 SEM  NO rubs, or gallops.  ABDOMEN: Soft, non-tender, non-distended. Bowel sounds present. No organomegaly or mass.  EXTREMITIES: No pedal edema, cyanosis, or clubbing.  PSYCHIATRIC: The patient is alert and oriented x 3.  SKIN: No obvious rash, lesion, or ulcer.   DATA REVIEW:   CBC  Recent Labs Lab 06/28/15 0408  WBC 14.0*  HGB 13.2  HCT 40.9  PLT 263    Chemistries   Recent Labs Lab 06/27/15 0807  06/29/15 0431  NA 139  < > 141  K 3.3*  < > 4.0  CL 107  < > 107  CO2 24  < > 27  GLUCOSE 152*  < > 138*  BUN 6  < > 7  CREATININE 0.49  < > 0.43*  CALCIUM 8.7*  < > 9.3  AST 25  --   --   ALT 29  --   --   ALKPHOS 94  --   --   BILITOT 3.6*  --   --   < > = values in this interval not displayed.  Cardiac Enzymes No results for input(s): TROPONINI in the last 168 hours.  Microbiology Results  @[email or web address] @  RADIOLOGY:  US Venous Img Lower Unilateral Right  06/28/2015    IMPRESSION: No evidence of deep venous thrombosis seen in right lower extremity.   Electronically Signed   By: Marijo Conception, M.D.   On: 06/28/2015 13:19      Management plans discussed with the  patient and she is in agreement. Stable for discharge home   Patient should follow up with Dr Humphrey Rolls tomorrow  CODE STATUS:     Code Status Orders        Start     Ordered   06/27/15 1331  Full code   Continuous     06/27/15 1331      TOTAL TIME TAKING CARE OF THIS PATIENT: 38 minutes.    Ranita Stjulien M.D on 06/29/2015 at 10:38 AM  Between 7am to 6pm - Pager - [phone] After 6pm go to [email or web address] - password [email or web address] [facility]  [place] [facility]  Office  [phone]  CC: Primary care physician; No PCP Per Patient

## 2015-06-29 NOTE — Progress Notes (Signed)
[place], [place].   06/29/2015  Patient: Shannon Schneider   Date of Birth:  1971/03/21  Date of admission:  06/27/2015  Date of Discharge  06/29/2015    To Whom it May Concern:   Tomara Youngberg  may return to work on 07/06/2015.  PHYSICAL ACTIVITY:  Full  If you have any questions or concerns, please don't hesitate to call.  Sincerely,   Hillary Bow R M.D Pager Number- [place] : [phone]   .

## 2015-06-30 ENCOUNTER — Encounter: Payer: Self-pay | Admitting: General Surgery

## 2015-07-10 ENCOUNTER — Telehealth: Payer: Self-pay | Admitting: *Deleted

## 2015-07-10 NOTE — Telephone Encounter (Signed)
Medications reviewed verbally by phone with patient and medication list will be updated accordingly.

## 2015-07-10 NOTE — Telephone Encounter (Signed)
Patient is calling about her surgery that she is having on 07/13/15. She stated that her medication has changed and she need to talk to you about them.

## 2015-07-13 ENCOUNTER — Ambulatory Visit: Payer: Commercial Managed Care - HMO | Admitting: Anesthesiology

## 2015-07-13 ENCOUNTER — Ambulatory Visit
Admission: RE | Admit: 2015-07-13 | Discharge: 2015-07-13 | Disposition: A | Discharge status: Home / Self Care | Payer: Commercial Managed Care - HMO | Source: Ambulatory Visit | Attending: Gastroenterology | Admitting: Gastroenterology

## 2015-07-13 ENCOUNTER — Encounter
Admission: RE | Disposition: A | Discharge status: Home / Self Care | Payer: Self-pay | Source: Ambulatory Visit | Attending: Gastroenterology

## 2015-07-13 DIAGNOSIS — F17210 Nicotine dependence, cigarettes, uncomplicated: Secondary | ICD-10-CM | POA: Insufficient documentation

## 2015-07-13 DIAGNOSIS — D571 Sickle-cell disease without crisis: Secondary | ICD-10-CM | POA: Insufficient documentation

## 2015-07-13 DIAGNOSIS — D125 Benign neoplasm of sigmoid colon: Secondary | ICD-10-CM | POA: Diagnosis not present

## 2015-07-13 DIAGNOSIS — K621 Rectal polyp: Secondary | ICD-10-CM | POA: Insufficient documentation

## 2015-07-13 DIAGNOSIS — Z79899 Other long term (current) drug therapy: Secondary | ICD-10-CM | POA: Diagnosis not present

## 2015-07-13 DIAGNOSIS — J449 Chronic obstructive pulmonary disease, unspecified: Secondary | ICD-10-CM | POA: Diagnosis not present

## 2015-07-13 DIAGNOSIS — R1900 Intra-abdominal and pelvic swelling, mass and lump, unspecified site: Secondary | ICD-10-CM | POA: Diagnosis not present

## 2015-07-13 DIAGNOSIS — I10 Essential (primary) hypertension: Secondary | ICD-10-CM | POA: Insufficient documentation

## 2015-07-13 HISTORY — PX: EUS: SHX5427

## 2015-07-13 SURGERY — ULTRASOUND, LOWER GI TRACT, ENDOSCOPIC
Anesthesia: General

## 2015-07-13 MED ORDER — PROPOFOL 10 MG/ML IV BOLUS
INTRAVENOUS | Status: DC | PRN
  Administered 2015-07-13: 50 mg via INTRAVENOUS

## 2015-07-13 MED ORDER — PROPOFOL INFUSION 10 MG/ML OPTIME
INTRAVENOUS | Status: DC | PRN
  Administered 2015-07-13: 100 ug/kg/min via INTRAVENOUS

## 2015-07-13 MED ORDER — MIDAZOLAM HCL 2 MG/2ML IJ SOLN
INTRAMUSCULAR | Status: DC | PRN
  Administered 2015-07-13: 2 mg via INTRAVENOUS

## 2015-07-13 MED ORDER — LIDOCAINE HCL (CARDIAC) 20 MG/ML IV SOLN
INTRAVENOUS | Status: DC | PRN
  Administered 2015-07-13: 60 mg via INTRAVENOUS

## 2015-07-13 MED ORDER — PHENYLEPHRINE HCL 10 MG/ML IJ SOLN
INTRAMUSCULAR | Status: DC | PRN
  Administered 2015-07-13: 50 ug via INTRAVENOUS

## 2015-07-13 MED ORDER — SODIUM CHLORIDE 0.9 % IV SOLN
INTRAVENOUS | Status: DC
  Administered 2015-07-13: 13:00:00 via INTRAVENOUS

## 2015-07-13 NOTE — Anesthesia Postprocedure Evaluation (Signed)
  Anesthesia Post-op Note  Patient: Shannon Schneider  Procedure(s) Performed: Procedure(s): LOWER ENDOSCOPIC ULTRASOUND (EUS) (N/A)  Anesthesia type:General  Patient location: PACU  Post pain: Pain level controlled  Post assessment: Post-op Vital signs reviewed, Patient's Cardiovascular Status Stable, Respiratory Function Stable, Patent Airway and No signs of Nausea or vomiting  Post vital signs: Reviewed and stable  Last Vitals:  Filed Vitals:   07/13/15 1410  BP: 101/69  Pulse: 65  Temp:   Resp: 30    Level of consciousness: awake, alert  and patient cooperative  Complications: No apparent anesthesia complications

## 2015-07-13 NOTE — H&P (Signed)
Primary Care Physician:  Kasandra Knudsen, NP  Pre-Procedure History & Physical: HPI:  Shannon Schneider is a 44 y.o. female is here for a lower EUS.   Past Medical History  Diagnosis Date  . Sickle cell anemia   . Sickle cell anemia     Past Surgical History  Procedure Laterality Date  . Cesarean section  1989  . Cholecystectomy  1996    Prior to Admission medications   Medication Sig Start Date End Date Taking? Authorizing Provider  carvedilol (COREG) 3.125 MG tablet Take 1 tablet (3.125 mg total) by mouth 2 (two) times daily with a meal. 06/29/15  Yes Bettey Costa, MD  Dextromethorphan-Guaifenesin (MUCINEX DM PO) Take by mouth.   Yes Historical Provider, MD  dicyclomine (BENTYL) 10 MG capsule  05/15/15  Yes Historical Provider, MD  furosemide (LASIX) 20 MG tablet Take 1 tablet (20 mg total) by mouth daily. 06/29/15  Yes Bettey Costa, MD  ivabradine (CORLANOR) 5 MG TABS tablet Take 5 mg by mouth 2 (two) times daily.   Yes Historical Provider, MD  OXYGEN Inhale into the lungs.   Yes Historical Provider, MD  polyethylene glycol powder (GLYCOLAX/MIRALAX) powder 255 grams one bottle for colonoscopy prep 06/22/15  Yes Robert Bellow, MD  sacubitril-valsartan (ENTRESTO) 24-26 MG Take 1 tablet by mouth 2 (two) times daily.   Yes Historical Provider, MD  Tiotropium Bromide Monohydrate (SPIRIVA HANDIHALER IN) Inhale into the lungs.   Yes Historical Provider, MD  FIBER PO Take by mouth daily.    Historical Provider, MD  Multiple Vitamin (MULTIVITAMIN) capsule Take 1 capsule by mouth daily.    Historical Provider, MD  VENTOLIN HFA 108 (90 BASE) MCG/ACT inhaler  06/23/15   Historical Provider, MD    Allergies as of 06/26/2015  . (No Known Allergies)    Family History  Problem Relation Age of Onset  . Renal Disease Mother   . Diabetes Mother   . Hypertension Mother   . Emphysema Father     History   Social History  . Marital Status: Married    Spouse Name: N/A  . Number of Children: N/A  .  Years of Education: N/A   Occupational History  . Not on file.   Social History Main Topics  . Smoking status: Current Every Day Smoker -- 1.00 packs/day for 20 years    Types: Cigarettes  . Smokeless tobacco: Not on file  . Alcohol Use: 0.0 oz/week    0 Standard drinks or equivalent per week     Comment: occasionally  . Drug Use: No  . Sexual Activity: Not on file   Other Topics Concern  . Not on file   Social History Narrative   ** Merged History Encounter **        Review of Systems: See HPI, otherwise negative ROS  Physical Exam: BP 107/71 mmHg  Pulse 66  Temp(Src) 98.6 F (37 C) (Oral)  Resp 20  Ht 5' (1.524 m)  Wt 82.555 kg (182 lb)  BMI 35.54 kg/m2  LMP 06/12/2015 General:   Alert,  pleasant and cooperative in NAD Head:  Normocephalic and atraumatic. Neck:  Supple; no masses or thyromegaly. Lungs:  Clear throughout to auscultation.    Heart:  Regular rate and rhythm. Abdomen:  Soft, nontender and nondistended. Normal bowel sounds, without guarding, and without rebound.   Neurologic:  Alert and  oriented x4;  grossly normal neurologically.  Impression/Plan: Shannon Schneider is here for an lower EUS to be  performed for abnormal CT: perirectal lesion.  Risks, benefits, limitations, and alternatives regarding  Lower EUS have been reviewed with the patient.  Questions have been answered.  All parties agreeable.   Cora Daniels, MD  07/13/2015, 1:05 PM

## 2015-07-13 NOTE — Transfer of Care (Signed)
Immediate Anesthesia Transfer of Care Note  Patient: Shannon Schneider  Procedure(s) Performed: Procedure(s): LOWER ENDOSCOPIC ULTRASOUND (EUS) (N/A)  Patient Location: PACU  Anesthesia Type:General  Level of Consciousness: awake and patient cooperative  Airway & Oxygen Therapy: Patient Spontanous Breathing and Patient connected to nasal cannula oxygen  Post-op Assessment: Report given to RN  Post vital signs: Reviewed and stable  Last Vitals:  Filed Vitals:   07/13/15 1346  BP: 97/66  Pulse: 66  Temp: 97.6  Resp: 20    Complications: No apparent anesthesia complications

## 2015-07-13 NOTE — Op Note (Addendum)
[facility] Gastroenterology Patient Name: Shannon Schneider Procedure Date: 07/13/2015 12:47 PM MRN: [id number] Account #: [id number] Date of Birth: June 08, 1971 Admit Type: Outpatient Age: 44 Room: [facility] ENDO ROOM 3 Gender: Female Note Status: Finalized Procedure:         Lower EUS Indications:       Suspected mass in pelvis seen on CT scan Providers:         Christian Mate. Earlie Counts Referring MD:      Robert Bellow, MD (Referring MD), Ivin Poot. Matthew Saras                     (Referring MD) Medicines:         Monitored Anesthesia Care Complications:     No immediate complications. Procedure:         Pre-Anesthesia Assessment:                    - Prior to the procedure, a History and Physical was                     performed, and patient medications, allergies and                     sensitivities were reviewed. The patient's tolerance of                     previous anesthesia was reviewed.                    After obtaining informed consent, the endoscope was passed                     under direct vision. Throughout the procedure, the                     patient's blood pressure, pulse, and oxygen saturations                     were monitored continuously. The Colonoscope was                     introduced through the anus and advanced to the the                     sigmoid colon for ultrasound. The EUS GI Radial Array                     [id number] was introduced through the anus and advanced to                     the the sigmoid colon for ultrasound. The lower EUS was                     accomplished without difficulty. The patient tolerated the                     procedure well. Findings:      The perianal and digital rectal examinations were normal. Pertinent       negatives include no palpable rectal lesions.      Endoscopic Finding :      A 5 mm polyp was found in the sigmoid colon. The polyp was sessile. The       polyp was removed with a cold snare. Resection and  retrieval were  complete.      Four sessile polyps were found in the rectum and in the sigmoid colon.       The polyps were 2 to 3 mm in size. These polyps were removed with a cold       biopsy forceps. Resection and retrieval were complete.      Non-bleeding hemorrhoids were found during retroflexion. The hemorrhoids       were medium-sized.      Endosonographic Finding :      A hypoechoic and heterogenous mass, with partially solid with possible       small cystic spaces was found in the right-lateral perirectal space. The       endosonographic borders were well-defined. The mass measured 43 mm by 21       mm in maximum cross-sectional diameter. There was no sonographic       evidence of invasion into the rectum and internal anal sphincter.      The rectum was normal.      The rectosigmoid junction was normal.      No abnormal-appearing lymph nodes were seen in the perirectal region. Impression:        - One 5 mm polyp in the sigmoid colon. Resected and                     retrieved.                    - Four 2 to 3 mm polyps in the rectum and in the sigmoid                     colon. Resected and retrieved.                    - Non-bleeding hemorrhoids.                    - A mass was visualized endosonographically in the right                     perirectal space. This had smooth borders, was                     heterogenous, partially solid with possible small cystic                     spaces, and was separate from the rectum and sphincters.                     Differential considerations include abscess, endometrioma,                     gynecologic neoplasm et al. FNA was deferred due to                     concern for contamination of the lesion with rectal                     contents/tumor seeding of the rectal wall.                    - Endosonographic images of the rectum were unremarkable.                    - Endosonographic imaging showed no sign of significant  pathology at the rectosigmoid junction.                    - No abnormal-appearing lymph nodes were seen in the                     perirectal region during endosonographic examination. Recommendation:    - Return to referring physician.                    - Consider radiologic sampling of the lesion vs.                     radiographic surveillance.                    - Await path results. If pathology from the polyps reveals                     adenomatous tissue, recommend full colonoscopy.                    - Patient has a contact number available for emergencies.                     The signs and symptoms of potential delayed complications                     were discussed with the patient. Return to normal                     activities tomorrow. Written discharge instructions were                     provided to the patient.                    - The patient will be observed post-procedure, until all                     discharge criteria are met. Procedure Code(s): --- Professional ---                    [phone], Sigmoidoscopy, flexible; with endoscopic ultrasound                     examination                    [phone], Sigmoidoscopy, flexible; with removal of tumor(s),                     polyp(s), or other lesion(s) by snare technique                    45331, 51, Sigmoidoscopy, flexible; with biopsy, single or                     multiple Diagnosis Code(s): --- Professional ---                    R93.5, Abnormal findings on diagnostic imaging of other                     abdominal regions, including retroperitoneum                    D12.5, Benign neoplasm of sigmoid colon  K62.1, Rectal polyp                    K62.89, Other specified diseases of anus and rectum                    K64.9, Unspecified hemorrhoids CPT copyright 2014 American Medical Association. All rights reserved. The codes documented in this report are preliminary and upon  coder review may  be revised to meet current compliance requirements. Attending Participation:      I personally performed the entire procedure without the assistance of a       fellow, resident or surgical assistant. Lindie Spruce,  07/13/2015 1:54:38 PM This report has been signed electronically. Number of Addenda: 0 Note Initiated On: 07/13/2015 12:47 PM      [facility]

## 2015-07-13 NOTE — Anesthesia Preprocedure Evaluation (Signed)
Anesthesia Evaluation  Patient identified by MRN, date of birth, ID band Patient awake    Reviewed: Allergy & Precautions, NPO status , Patient's Chart, lab work & pertinent test results, reviewed documented beta blocker date and time   History of Anesthesia Complications Negative for: history of anesthetic complications  Airway Mallampati: II  TM Distance: >3 FB Neck ROM: Full    Dental no notable dental hx.    Pulmonary COPD oxygen dependent, Current Smoker,  breath sounds clear to auscultation  Pulmonary exam normal       Cardiovascular Exercise Tolerance: Poor hypertension, Pt. on medications and Pt. on home beta blockers +CHF negative cardio ROS Normal cardiovascular examRhythm:Regular Rate:Normal     Neuro/Psych negative neurological ROS  negative psych ROS   GI/Hepatic negative GI ROS, Neg liver ROS,   Endo/Other  negative endocrine ROS  Renal/GU negative Renal ROS  negative genitourinary   Musculoskeletal negative musculoskeletal ROS (+)   Abdominal   Peds negative pediatric ROS (+)  Hematology  (+) Sickle cell anemia ,   Anesthesia Other Findings   Reproductive/Obstetrics negative OB ROS                             Anesthesia Physical Anesthesia Plan  ASA: III  Anesthesia Plan: General   Post-op Pain Management:    Induction: Intravenous  Airway Management Planned: Nasal Cannula  Additional Equipment:   Intra-op Plan:   Post-operative Plan:   Informed Consent: I have reviewed the patients History and Physical, chart, labs and discussed the procedure including the risks, benefits and alternatives for the proposed anesthesia with the patient or authorized representative who has indicated his/her understanding and acceptance.   Dental advisory given  Plan Discussed with: CRNA and Surgeon  Anesthesia Plan Comments:         Anesthesia Quick Evaluation

## 2015-07-18 LAB — SURGICAL PATHOLOGY

## 2015-07-24 ENCOUNTER — Telehealth: Payer: Self-pay

## 2015-07-24 ENCOUNTER — Encounter: Payer: Self-pay | Admitting: Gastroenterology

## 2015-07-24 NOTE — Telephone Encounter (Signed)
Message left for patient to call back to schedule a follow up visit with Dr Bary Castilla.

## 2015-07-24 NOTE — Telephone Encounter (Signed)
-----   Message from Robert Bellow, MD sent at 07/23/2015  8:47 AM EDT ----- Patient needs a f/u appt to discuss management or mass.

## 2015-07-25 NOTE — Telephone Encounter (Signed)
PT CALLED & MADE AN APPT FOR 07-31-15 @ 1:30PM

## 2015-07-31 ENCOUNTER — Encounter: Payer: Self-pay | Admitting: General Surgery

## 2015-07-31 ENCOUNTER — Ambulatory Visit (INDEPENDENT_AMBULATORY_CARE_PROVIDER_SITE_OTHER): Payer: 59 | Admitting: General Surgery

## 2015-07-31 VITALS — BP 108/70 | HR 68 | Resp 13 | Ht 60.0 in | Wt 178.0 lb

## 2015-07-31 DIAGNOSIS — K6289 Other specified diseases of anus and rectum: Secondary | ICD-10-CM

## 2015-07-31 NOTE — Progress Notes (Signed)
Patient ID: Shannon Schneider Schneider, female   DOB: May 10, 1971, 44 y.o.   MRN: [id number]  Chief Complaint  Patient presents with  . Follow-up    rectal mass    HPI Shannon Schneider Schneider is a 44 y.o. female here to discuss findings of a lower endoscopic ultrasound done on 07/10/15. She reports no abdominal pain. She has been using the bathroom daily with out problems. She was hospitalized on 06/27/15 with pneumonia. She was diagnosed with COPD and CHF. The patient is being followed by Dr. Raul Del from pulmonary and Dr. Chancy Milroy from cardiology.  The patient did tolerate her endorectal ultrasound without difficulty. HPI  Past Medical History  Diagnosis Date  . Congestive heart failure (CHF) 06/27/15  . COPD (chronic obstructive pulmonary disease) 06/27/15  . 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 Sickle cell anemia     Past Surgical History  Procedure Laterality Date  . Cesarean section  1989  . Cholecystectomy  1996  . Eus N/A 07/13/2015    Procedure: LOWER ENDOSCOPIC ULTRASOUND (EUS);  Surgeon: Cora Daniels, MD;  Location: [facility] ENDOSCOPY;  Service: Endoscopy;  Laterality: N/A;    Family History  Problem Relation Age of Onset  . Renal Disease Mother   . Diabetes Mother   . Hypertension Mother   . Emphysema Father     Social History History  Substance Use Topics  . Smoking status: Current Every Day Smoker -- 1.00 packs/day for 20 years    Types: Cigarettes  . Smokeless tobacco: Never Used  . Alcohol Use: 0.0 oz/week    0 Standard drinks or equivalent per week     Comment: occasionally    No Known Allergies  Current Outpatient Prescriptions  Medication Sig Dispense Refill  . carvedilol (COREG) 3.125 MG tablet Take 1 tablet (3.125 mg total) by mouth 2 (two) times daily with a meal. 60 tablet 0  . FIBER PO Take by mouth daily.    . Fluticasone-Salmeterol (ADVAIR) 250-50 MCG/DOSE AEPB Inhale 1 puff into the lungs 2 (two) times daily.    . furosemide (LASIX) 20 MG tablet Take 1 tablet (20 mg total)  by mouth daily. 30 tablet 0  . ivabradine (CORLANOR) 5 MG TABS tablet Take 5 mg by mouth 2 (two) times daily.    . Multiple Vitamin (MULTIVITAMIN) capsule Take 1 capsule by mouth daily.    . OXYGEN Inhale 2 L into the lungs continuous.     . sacubitril-valsartan (ENTRESTO) 24-26 MG Take 1 tablet by mouth 2 (two) times daily.    . Tiotropium Bromide Monohydrate (SPIRIVA HANDIHALER IN) Inhale into the lungs.    . VENTOLIN HFA 108 (90 BASE) MCG/ACT inhaler      No current facility-administered medications for this visit.    Review of Systems Review of Systems  Constitutional: Negative.   Respiratory: Positive for shortness of breath. Negative for apnea, cough, choking, chest tightness, wheezing and stridor.   Cardiovascular: Negative.   Gastrointestinal: Negative.     Blood pressure 108/70, pulse 68, resp. rate 13, height 5' (1.524 m), weight 178 lb (80.74 kg), last menstrual period 07/11/2015, SpO2 93 %. The patient's weight is 2 weeks prior to her urgent hospitalization was 184 pounds. Physical Exam Physical Exam  Constitutional: She is oriented to person, place, and time. She appears well-developed and well-nourished.  Cardiovascular: Normal rate and regular rhythm.   Pulmonary/Chest: Effort normal and breath sounds normal.  Neurological: She is alert and oriented to person, place, and time.  Skin: Skin is  warm and dry.  Psychiatric: She has a normal mood and affect.    Data Reviewed CLINICAL DATA: Shortness of breath for 2 weeks with hypoxia, initial encounter  EXAM: CHEST - 2 VIEW  COMPARISON: None.  FINDINGS: Cardiac shadow is mildly enlarged. The lungs are well aerated with bibasilar changes right greater than left with associated small right-sided pleural effusion.  IMPRESSION: Bibasilar atelectasis/ infiltrate with right-sided effusion.   Electronically Signed  By: Inez Catalina M.D.  On: 06/27/2015 10:31      Echocardiography showed an ejection  fraction of 35-40% during her June 2016 admission for worsening shortness of breath and lower extremity edema.  Endorectal ultrasound showed a well-defined mass separate from the rectal wall. Polyps removed at the time of the procedure were all hyperplastic.  Assessment    Perirectal mass, asymptomatic.  Recent right lower lobe pneumonia with associated congestive heart failure with mitral regurgitation, now oxygen dependent.    Plan    The peri-rectal mass noted on CT scan has shown modest change in size on retrospective review of previous CTs. In light of her recent significantly depressed cardiopulmonary function, surgical intervention would be placed on hold. The area has shown some modest change, and resection is probably the best management when she is medically stable.  She is asked to weigh daily at home and to bring her weight records to her appointment scheduled for the middle of the month with the cardiology service.  Today's oxygen saturation on O2 at 2 L/m was 93%.  The plan for a follow-up examination in 2 months, earlier if cardiology/pulmonary symptoms resolve.    PCP; [address]   Robert Bellow 07/31/2015, 9:23 PM

## 2015-09-26 ENCOUNTER — Other Ambulatory Visit: Payer: Self-pay | Admitting: Nurse Practitioner

## 2015-09-26 DIAGNOSIS — Z1231 Encounter for screening mammogram for malignant neoplasm of breast: Secondary | ICD-10-CM

## 2015-10-03 ENCOUNTER — Ambulatory Visit (INDEPENDENT_AMBULATORY_CARE_PROVIDER_SITE_OTHER): Payer: Commercial Managed Care - HMO | Admitting: General Surgery

## 2015-10-03 ENCOUNTER — Ambulatory Visit: Payer: 59 | Admitting: General Surgery

## 2015-10-03 ENCOUNTER — Encounter: Payer: Self-pay | Admitting: General Surgery

## 2015-10-03 VITALS — BP 96/50 | HR 78 | Resp 16 | Ht 60.0 in | Wt 176.0 lb

## 2015-10-03 DIAGNOSIS — K6289 Other specified diseases of anus and rectum: Secondary | ICD-10-CM

## 2015-10-03 NOTE — Patient Instructions (Signed)
The patient is aware to call back for any questions or concerns.  

## 2015-10-03 NOTE — Progress Notes (Signed)
Patient ID: Shannon Schneider, female   DOB: 1971-04-10, 44 y.o.   MRN: [id number]  Chief Complaint  Patient presents with  . Follow-up    HPI Shannon Schneider is a 44 y.o. female.  She is here for follow up perirectal mass. She states at the time of her recent checkup with Dr. Ceasar Mons and cardiology told her her heart was doing well. Dr. 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   She also states that the abdominal pain that she was having with her menstrual periods is gone now.   no difficulty with bowel movements or pelvic pain.  HPI  Past Medical History  Diagnosis Date  . Congestive heart failure (CHF) ([place]) 06/27/15  . COPD (chronic obstructive pulmonary disease) ([place]) 06/27/15  . 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 Sickle cell anemia [facility])     Past Surgical History  Procedure Laterality Date  . Cesarean section  1989  . Cholecystectomy  1996  . Eus N/A 07/13/2015    Procedure: LOWER ENDOSCOPIC ULTRASOUND (EUS);  Surgeon: Cora Daniels, MD;  Location: [facility] ENDOSCOPY;  Service: Endoscopy;  Laterality: N/A;    Family History  Problem Relation Age of Onset  . Renal Disease Mother   . Diabetes Mother   . Hypertension Mother   . Emphysema Father     Social History Social History  Substance Use Topics  . Smoking status: Current Every Day Smoker -- 0.25 packs/day for 20 years    Types: Cigarettes  . Smokeless tobacco: Never Used  . Alcohol Use: 0.0 oz/week    0 Standard drinks or equivalent per week     Comment: occasionally    No Known Allergies  Current Outpatient Prescriptions  Medication Sig Dispense Refill  . carvedilol (COREG) 3.125 MG tablet Take 1 tablet (3.125 mg total) by mouth 2 (two) times daily with a meal. 60 tablet 0  . ivabradine (CORLANOR) 5 MG TABS tablet Take 5 mg by mouth 2 (two) times daily.    . Multiple Vitamin (MULTIVITAMIN) capsule Take 1 capsule by mouth daily.    . OXYGEN Inhale 2 L into the lungs continuous.      . sacubitril-valsartan (ENTRESTO) 24-26 MG Take 1 tablet by mouth 2 (two) times daily.    Marland Kitchen spironolactone (ALDACTONE) 25 MG tablet Take 25 mg by mouth daily.    . Tiotropium Bromide Monohydrate (SPIRIVA HANDIHALER IN) Inhale into the lungs.    . VENTOLIN HFA 108 (90 BASE) MCG/ACT inhaler      No current facility-administered medications for this visit.    Review of Systems Review of Systems  Constitutional: Negative.   Respiratory: Negative.   Cardiovascular: Negative.     Blood pressure 96/50, pulse 78, resp. rate 16, height 5' (1.524 m), weight 176 lb (79.833 kg), last menstrual period 09/25/2015.  Physical Exam Physical Exam  Constitutional: She is oriented to person, place, and time. She appears well-developed and well-nourished.  HENT:  Mouth/Throat: Oropharynx is clear and moist.  Eyes: Conjunctivae are normal. No scleral icterus.  Cardiovascular: Normal rate, regular rhythm and normal heart sounds.   No lower leg edema.  Pulmonary/Chest: Effort normal and breath sounds normal.  Abdominal: Soft. Normal appearance.  Genitourinary: Rectal exam shows mass (palpable at 6-7 cm right anterior rectal wall, unchanged, smooth, nontender.).  Lymphadenopathy:    She has no cervical adenopathy.  Neurological: She is alert and oriented to person, place, and time.  Skin: Skin is  warm and dry.  Psychiatric: Her behavior is normal.      Assessment    Slow recovery status post pneumonia with cardiac insufficiency, improving.    Plan    As the patient's abdominal pain is resolved, and she is still making recovery from her pulmonary events earlier this summer, and with the minimal change between 2012/13 and 2016 imaging studies, we'll put surgical excision of the perirectal mass on hold.  We'll make arrangements for the patient had a follow-up CT of the abdomen and pelvis with contrast at her PCP office in spring 2017 with an office visit here to follow. She reports that she has  decreased her cigarette consumption from 20-3 per day.  If she were a candidate for surgical intervention in regards to her known uterine fibroids, discussion undertaken at that time for a combined procedure for resection of the right anterior perirectal mass.         PCP:  Tyrell Antonio Dr Donney Rankins 10/03/2015, 8:12 PM

## 2015-10-05 ENCOUNTER — Ambulatory Visit
Admission: RE | Admit: 2015-10-05 | Discharge: 2015-10-05 | Disposition: A | Discharge status: Home / Self Care | Payer: 59 | Source: Ambulatory Visit | Attending: Nurse Practitioner | Admitting: Nurse Practitioner

## 2015-10-05 DIAGNOSIS — Z1231 Encounter for screening mammogram for malignant neoplasm of breast: Secondary | ICD-10-CM | POA: Diagnosis present

## 2016-03-28 ENCOUNTER — Encounter: Payer: Self-pay | Admitting: *Deleted

## 2016-05-14 ENCOUNTER — Ambulatory Visit: Payer: Commercial Managed Care - HMO | Admitting: General Surgery

## 2016-05-16 ENCOUNTER — Encounter: Payer: Self-pay | Admitting: General Surgery

## 2016-05-16 ENCOUNTER — Ambulatory Visit (INDEPENDENT_AMBULATORY_CARE_PROVIDER_SITE_OTHER): Payer: Commercial Managed Care - HMO | Admitting: General Surgery

## 2016-05-16 VITALS — BP 122/66 | HR 70 | Resp 18 | Ht 60.0 in | Wt 163.0 lb

## 2016-05-16 DIAGNOSIS — K6289 Other specified diseases of anus and rectum: Secondary | ICD-10-CM | POA: Diagnosis not present

## 2016-05-16 NOTE — Patient Instructions (Signed)
Will contact within one week.

## 2016-05-16 NOTE — Progress Notes (Signed)
Patient ID: Shannon Schneider, female   DOB: [birth year], 45 y.o.   MRN: [id number]  Chief Complaint  Patient presents with  . Follow-up    peri rectal mass    HPI Shannon Schneider is a 45 y.o. female here today for her follow up peri rectal mass. Patient states had a ct scan done on 05/07/16. No GI problems, moves her bowles daily. HPI  Past Medical History  Diagnosis Date  . Congestive heart failure (CHF) ([place]) 06/27/15  . COPD (chronic obstructive pulmonary disease) ([place]) 06/27/15  . 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 Sickle cell anemia [facility])     Past Surgical History  Procedure Laterality Date  . Cesarean section  1989  . Cholecystectomy  1996  . Eus N/A 07/13/2015    Procedure: LOWER ENDOSCOPIC ULTRASOUND (EUS);  Surgeon: Cora Daniels, MD;  Location: [facility] ENDOSCOPY;  Service: Endoscopy;  Laterality: N/A;    Family History  Problem Relation Age of Onset  . Renal Disease Mother   . Diabetes Mother   . Hypertension Mother   . Emphysema Father     Social History Social History  Substance Use Topics  . Smoking status: Current Every Day Smoker -- 0.25 packs/day for 20 years    Types: Cigarettes  . Smokeless tobacco: Never Used  . Alcohol Use: 0.0 oz/week    0 Standard drinks or equivalent per week     Comment: occasionally    No Known Allergies  Current Outpatient Prescriptions  Medication Sig Dispense Refill  . carvedilol (COREG) 12.5 MG tablet Take 12.5 mg by mouth 2 (two) times daily with a meal.    . digoxin (LANOXIN) 0.05 MG/ML solution Take by mouth daily.    . furosemide (LASIX) 40 MG tablet Take 40 mg by mouth.    . loratadine (CLARITIN) 10 MG tablet Take 10 mg by mouth daily.    . Multiple Vitamin (MULTIVITAMIN) capsule Take 1 capsule by mouth daily.    . OXYGEN Inhale 2 L into the lungs continuous.     . sacubitril-valsartan (ENTRESTO) 24-26 MG Take 1 tablet by mouth 2 (two) times daily.    . VENTOLIN HFA 108 (90 BASE) MCG/ACT inhaler      No current  facility-administered medications for this visit.    Review of Systems Review of Systems  Constitutional: Negative.   Respiratory: Negative.   Cardiovascular: Negative.     Blood pressure 122/66, pulse 70, resp. rate 18, height 5' (1.524 m), weight 163 lb (73.936 kg), last menstrual period 05/09/2016.  The patient's weight is down 13 pounds from her 10/03/2015 evaluation.  Physical Exam Physical Exam  Constitutional: She is oriented to person, place, and time. She appears well-developed and well-nourished.  Eyes: Conjunctivae are normal. No scleral icterus.  Neck: Neck supple.  Cardiovascular: Normal rate, regular rhythm and normal heart sounds.   Pulmonary/Chest: Effort normal and breath sounds normal.  Abdominal: Soft. Bowel sounds are normal. There is no hepatomegaly. There is no tenderness.  Lymphadenopathy:    She has no cervical adenopathy.  Neurological: She is alert and oriented to person, place, and time.  Skin: Skin is warm and dry.    Data Reviewed CT scan of the abdomen and pelvis completed 05/08/2016 at [place] was independently reviewed. Noted by the radiologist was an enlarging left adnexal, ovarian cyst. Persistent right perirectal mass. "Splenectomy".  Review the films shows a new pericardial effusion. The rectal mass presently measures 2.6 x 3.5 x 4.8 cm in maximum  dimension. At the time of endorectal ultrasound on 07/10/2015 greatest dimension was 2.1 x 4.3 cm area the 2013 CT scan showed a 1.6 x 4.0 x 4.0 cm mass.  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    Assessment    Pericardial effusion.  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  New left ovarian cyst/cystic lesion requiring dedicated ultrasound.  Minimal enlargement of the right perirectal mass since 2013, asymptomatic.    Plan    We'll arrange for-GYN evaluation. She  had previously been evaluated by Verlene Mayer, M.D. who is now retired. She'll be contacted to see who she would like to have evaluate the new pelvic finding.  We'll obtain an informal consultation at the [facility] level regarding management of this mass in a patient with MULTIPLE medical comorbidities.     Will contact patient with University recommendation.    PCP:  Matthew Saras This information has been scribed by Gaspar Cola CMA.    Robert Bellow 05/18/2016, 11:09 AM

## 2016-05-20 ENCOUNTER — Telehealth: Payer: Self-pay

## 2016-05-20 NOTE — Telephone Encounter (Signed)
-----   Message from Robert Bellow, MD sent at 05/18/2016 11:41 AM EDT ----- Please notify the patient that the CT scan showed a new cyst involving the left ovary. She should be evaluated by GYN. She had previously seen Dr. Laurey Morale who is now retired. See if she would like to see one of the other physicians at [facility], or another physician and arrange for an evaluation.   I will let her know when I hear back from the [facility] regarding the lesion near her rectum Thank you

## 2016-05-20 NOTE — Telephone Encounter (Signed)
Notified patient as instructed, patient pleased. Will call and get set up with another doctor at [facility] for this.

## 2016-06-20 ENCOUNTER — Telehealth: Payer: Self-pay | Admitting: *Deleted

## 2016-06-20 NOTE — Telephone Encounter (Signed)
Notified patient as instructed, patient pleased. She already has an appointment on 06-28-26 with [facility]. She is aware we need statement cleared for surgery. Scheduled pre op appointment, 07-15-16 (pt request) patient agrees

## 2016-06-20 NOTE — Telephone Encounter (Signed)
-----   Message from Robert Bellow, MD sent at 06/20/2016  8:22 AM EDT ----- Please notify the patient that I spoke to the physicians at [facility] and they have recommended that the area be removed.  We need to arrange for her to see her medical physician for preop clearance due to her precarious medical status with an office visit to follow. Thank you ----- Message -----    From: Carson Myrtle, RN    Sent: 06/06/2016   1:36 PM      To: Robert Bellow, MD  This is the patient you were asking about.

## 2016-07-08 ENCOUNTER — Ambulatory Visit: Payer: 59

## 2016-07-15 ENCOUNTER — Ambulatory Visit: Payer: Self-pay | Admitting: General Surgery

## 2016-08-23 ENCOUNTER — Encounter: Payer: Self-pay | Admitting: General Surgery

## 2016-09-12 ENCOUNTER — Encounter: Payer: Self-pay | Admitting: *Deleted

## 2016-09-19 ENCOUNTER — Other Ambulatory Visit: Payer: Self-pay | Admitting: Gastroenterology

## 2016-09-19 DIAGNOSIS — R17 Unspecified jaundice: Secondary | ICD-10-CM

## 2016-09-24 ENCOUNTER — Ambulatory Visit
Admission: RE | Admit: 2016-09-24 | Discharge: 2016-09-24 | Disposition: A | Discharge status: Home / Self Care | Payer: BLUE CROSS/BLUE SHIELD | Source: Ambulatory Visit | Attending: Gastroenterology | Admitting: Gastroenterology

## 2016-09-24 DIAGNOSIS — Z9049 Acquired absence of other specified parts of digestive tract: Secondary | ICD-10-CM | POA: Insufficient documentation

## 2016-09-24 DIAGNOSIS — N281 Cyst of kidney, acquired: Secondary | ICD-10-CM | POA: Diagnosis not present

## 2016-09-24 DIAGNOSIS — R17 Unspecified jaundice: Secondary | ICD-10-CM | POA: Diagnosis present

## 2016-10-30 ENCOUNTER — Other Ambulatory Visit: Payer: Self-pay | Admitting: Gastroenterology

## 2016-10-30 DIAGNOSIS — R17 Unspecified jaundice: Secondary | ICD-10-CM

## 2016-11-06 ENCOUNTER — Ambulatory Visit
Admission: RE | Admit: 2016-11-06 | Discharge: 2016-11-06 | Disposition: A | Discharge status: Home / Self Care | Payer: BLUE CROSS/BLUE SHIELD | Source: Ambulatory Visit | Attending: Gastroenterology | Admitting: Gastroenterology

## 2016-11-06 DIAGNOSIS — R17 Unspecified jaundice: Secondary | ICD-10-CM | POA: Insufficient documentation

## 2016-11-18 ENCOUNTER — Other Ambulatory Visit: Payer: Self-pay | Admitting: Nurse Practitioner

## 2016-11-18 DIAGNOSIS — Z1231 Encounter for screening mammogram for malignant neoplasm of breast: Secondary | ICD-10-CM

## 2016-12-30 ENCOUNTER — Emergency Department: Payer: BLUE CROSS/BLUE SHIELD

## 2016-12-30 ENCOUNTER — Encounter: Payer: Self-pay | Admitting: Emergency Medicine

## 2016-12-30 ENCOUNTER — Inpatient Hospital Stay
Admission: EM | Admit: 2016-12-30 | Discharge: 2017-01-02 | DRG: 640 | Disposition: A | Discharge status: Home Health | Payer: BLUE CROSS/BLUE SHIELD | Attending: Specialist | Admitting: Specialist

## 2016-12-30 DIAGNOSIS — I4891 Unspecified atrial fibrillation: Secondary | ICD-10-CM | POA: Diagnosis present

## 2016-12-30 DIAGNOSIS — F329 Major depressive disorder, single episode, unspecified: Secondary | ICD-10-CM | POA: Diagnosis present

## 2016-12-30 DIAGNOSIS — Z8249 Family history of ischemic heart disease and other diseases of the circulatory system: Secondary | ICD-10-CM | POA: Diagnosis not present

## 2016-12-30 DIAGNOSIS — Z833 Family history of diabetes mellitus: Secondary | ICD-10-CM | POA: Diagnosis not present

## 2016-12-30 DIAGNOSIS — I442 Atrioventricular block, complete: Secondary | ICD-10-CM | POA: Diagnosis present

## 2016-12-30 DIAGNOSIS — Z841 Family history of disorders of kidney and ureter: Secondary | ICD-10-CM | POA: Diagnosis not present

## 2016-12-30 DIAGNOSIS — E871 Hypo-osmolality and hyponatremia: Secondary | ICD-10-CM | POA: Diagnosis present

## 2016-12-30 DIAGNOSIS — I429 Cardiomyopathy, unspecified: Secondary | ICD-10-CM | POA: Diagnosis present

## 2016-12-30 DIAGNOSIS — Z9049 Acquired absence of other specified parts of digestive tract: Secondary | ICD-10-CM | POA: Diagnosis not present

## 2016-12-30 DIAGNOSIS — I9589 Other hypotension: Secondary | ICD-10-CM | POA: Diagnosis present

## 2016-12-30 DIAGNOSIS — R197 Diarrhea, unspecified: Secondary | ICD-10-CM

## 2016-12-30 DIAGNOSIS — J441 Chronic obstructive pulmonary disease with (acute) exacerbation: Secondary | ICD-10-CM | POA: Diagnosis present

## 2016-12-30 DIAGNOSIS — F17210 Nicotine dependence, cigarettes, uncomplicated: Secondary | ICD-10-CM | POA: Diagnosis present

## 2016-12-30 DIAGNOSIS — E861 Hypovolemia: Secondary | ICD-10-CM | POA: Diagnosis present

## 2016-12-30 DIAGNOSIS — R112 Nausea with vomiting, unspecified: Secondary | ICD-10-CM | POA: Diagnosis not present

## 2016-12-30 DIAGNOSIS — Z79899 Other long term (current) drug therapy: Secondary | ICD-10-CM

## 2016-12-30 DIAGNOSIS — T460X5A Adverse effect of cardiac-stimulant glycosides and drugs of similar action, initial encounter: Secondary | ICD-10-CM | POA: Diagnosis present

## 2016-12-30 DIAGNOSIS — E860 Dehydration: Secondary | ICD-10-CM | POA: Diagnosis present

## 2016-12-30 DIAGNOSIS — D5700 Hb-SS disease with crisis, unspecified: Secondary | ICD-10-CM

## 2016-12-30 DIAGNOSIS — Z825 Family history of asthma and other chronic lower respiratory diseases: Secondary | ICD-10-CM

## 2016-12-30 DIAGNOSIS — J9611 Chronic respiratory failure with hypoxia: Secondary | ICD-10-CM | POA: Diagnosis present

## 2016-12-30 DIAGNOSIS — Z9981 Dependence on supplemental oxygen: Secondary | ICD-10-CM

## 2016-12-30 DIAGNOSIS — R0902 Hypoxemia: Secondary | ICD-10-CM

## 2016-12-30 DIAGNOSIS — N179 Acute kidney failure, unspecified: Secondary | ICD-10-CM | POA: Diagnosis present

## 2016-12-30 DIAGNOSIS — R1110 Vomiting, unspecified: Secondary | ICD-10-CM

## 2016-12-30 HISTORY — DX: Other disorders of bilirubin metabolism: E80.6

## 2016-12-30 LAB — CBC WITH DIFFERENTIAL/PLATELET
BAND NEUTROPHILS: 0 %
BASOS PCT: 0 %
Basophils Absolute: 0 10*3/uL (ref 0–0.1)
Blasts: 0 %
EOS ABS: 0 10*3/uL (ref 0–0.7)
Eosinophils Relative: 0 %
HCT: 37.1 % (ref 35.0–47.0)
Hemoglobin: 13.2 g/dL (ref 12.0–16.0)
LYMPHS ABS: 1.3 10*3/uL (ref 1.0–3.6)
Lymphocytes Relative: 12 %
MCH: 39.9 pg — ABNORMAL HIGH (ref 26.0–34.0)
MCHC: 35.7 g/dL (ref 32.0–36.0)
MCV: 111.9 fL — AB (ref 80.0–100.0)
METAMYELOCYTES PCT: 0 %
MYELOCYTES: 0 %
Monocytes Absolute: 0.4 10*3/uL (ref 0.2–0.9)
Monocytes Relative: 4 %
Neutro Abs: 9 10*3/uL — ABNORMAL HIGH (ref 1.4–6.5)
Neutrophils Relative %: 84 %
Other: 0 %
PLATELETS: 190 10*3/uL (ref 150–440)
Promyelocytes Absolute: 0 %
RBC: 3.32 MIL/uL — ABNORMAL LOW (ref 3.80–5.20)
RDW: 19.9 % — AB (ref 11.5–14.5)
WBC: 10.7 10*3/uL (ref 3.6–11.0)
nRBC: 14 /100 WBC — ABNORMAL HIGH

## 2016-12-30 LAB — HEPATIC FUNCTION PANEL
ALT: 20 U/L (ref 14–54)
AST: 27 U/L (ref 15–41)
Albumin: 3.7 g/dL (ref 3.5–5.0)
Alkaline Phosphatase: 110 U/L (ref 38–126)
BILIRUBIN DIRECT: 4.6 mg/dL — AB (ref 0.1–0.5)
Indirect Bilirubin: 5.2 mg/dL — ABNORMAL HIGH (ref 0.3–0.9)
TOTAL PROTEIN: 6.8 g/dL (ref 6.5–8.1)
Total Bilirubin: 9.8 mg/dL — ABNORMAL HIGH (ref 0.3–1.2)

## 2016-12-30 LAB — SODIUM: SODIUM: 123 mmol/L — AB (ref 135–145)

## 2016-12-30 LAB — RETICULOCYTES
RBC.: 3.32 MIL/uL — ABNORMAL LOW (ref 3.80–5.20)
RETIC CT PCT: 11.1 % — AB (ref 0.4–3.1)
Retic Count, Absolute: 368.5 10*3/uL — ABNORMAL HIGH (ref 19.0–183.0)

## 2016-12-30 LAB — BASIC METABOLIC PANEL
Anion gap: 12 (ref 5–15)
BUN: 25 mg/dL — ABNORMAL HIGH (ref 6–20)
CO2: 18 mmol/L — ABNORMAL LOW (ref 22–32)
Calcium: 8.8 mg/dL — ABNORMAL LOW (ref 8.9–10.3)
Chloride: 88 mmol/L — ABNORMAL LOW (ref 101–111)
Creatinine, Ser: 1.07 mg/dL — ABNORMAL HIGH (ref 0.44–1.00)
GFR calc Af Amer: >60 mL/min (ref >60)
GLUCOSE: 147 mg/dL — AB (ref 65–99)
POTASSIUM: 3.8 mmol/L (ref 3.5–5.1)
SODIUM: 118 mmol/L — AB (ref 135–145)

## 2016-12-30 LAB — BILIRUBIN, DIRECT: Bilirubin, Direct: 4.6 mg/dL — ABNORMAL HIGH (ref 0.1–0.5)

## 2016-12-30 LAB — DIGOXIN LEVEL: DIGOXIN LVL: 2.1 ng/mL — AB (ref 0.8–2.0)

## 2016-12-30 LAB — GLUCOSE, CAPILLARY: GLUCOSE-CAPILLARY: 122 mg/dL — AB (ref 65–99)

## 2016-12-30 MED ORDER — HYDROMORPHONE HCL 1 MG/ML IJ SOLN
INTRAMUSCULAR | Status: AC
  Administered 2016-12-30: 1 mg via INTRAVENOUS
  Filled 2016-12-30: qty 1

## 2016-12-30 MED ORDER — TIOTROPIUM BROMIDE MONOHYDRATE 2.5 MCG/ACT IN AERS: 1.0000 | INHALATION_SPRAY | Freq: Two times a day (BID) | RESPIRATORY_TRACT | Status: DC

## 2016-12-30 MED ORDER — SODIUM CHLORIDE 0.9 % IV SOLN
INTRAVENOUS | Status: DC
  Administered 2016-12-30: 18:00:00 via INTRAVENOUS

## 2016-12-30 MED ORDER — SODIUM CHLORIDE 0.9 % IV BOLUS (SEPSIS): 1000.0000 mL | Freq: Once | INTRAVENOUS | Status: DC

## 2016-12-30 MED ORDER — ONDANSETRON HCL 4 MG/2ML IJ SOLN: 4.0000 mg | Freq: Once | INTRAMUSCULAR | Status: DC

## 2016-12-30 MED ORDER — CITALOPRAM HYDROBROMIDE 20 MG PO TABS
10.0000 mg | ORAL_TABLET | Freq: Every day | ORAL | Status: DC
  Administered 2016-12-30: 10 mg via ORAL
  Filled 2016-12-30: qty 1

## 2016-12-30 MED ORDER — POLYETHYLENE GLYCOL 3350 17 G PO PACK: 17.0000 g | PACK | Freq: Every day | ORAL | Status: DC | PRN

## 2016-12-30 MED ORDER — DIGOXIN 250 MCG PO TABS: 250.0000 ug | ORAL_TABLET | Freq: Every day | ORAL | Status: DC

## 2016-12-30 MED ORDER — HYDROMORPHONE HCL 1 MG/ML IJ SOLN
1.0000 mg | Freq: Once | INTRAMUSCULAR | Status: AC
  Administered 2016-12-30: 1 mg via INTRAVENOUS

## 2016-12-30 MED ORDER — MORPHINE SULFATE (PF) 4 MG/ML IV SOLN
4.0000 mg | INTRAVENOUS | Status: DC | PRN
  Administered 2016-12-30 – 2017-01-02 (×4): 4 mg via INTRAVENOUS
  Filled 2016-12-30 (×4): qty 1

## 2016-12-30 MED ORDER — LORATADINE 10 MG PO TABS
10.0000 mg | ORAL_TABLET | Freq: Every day | ORAL | Status: DC
  Administered 2016-12-30 – 2017-01-02 (×4): 10 mg via ORAL
  Filled 2016-12-30 (×4): qty 1

## 2016-12-30 MED ORDER — ENOXAPARIN SODIUM 40 MG/0.4ML ~~LOC~~ SOLN
40.0000 mg | SUBCUTANEOUS | Status: DC
  Administered 2016-12-30 – 2017-01-01 (×3): 40 mg via SUBCUTANEOUS
  Filled 2016-12-30 (×3): qty 0.4

## 2016-12-30 MED ORDER — ACETAMINOPHEN 325 MG PO TABS
650.0000 mg | ORAL_TABLET | Freq: Four times a day (QID) | ORAL | Status: DC | PRN
  Administered 2016-12-30: 650 mg via ORAL
  Filled 2016-12-30: qty 2

## 2016-12-30 MED ORDER — HYDROCODONE-ACETAMINOPHEN 10-325 MG PO TABS
1.0000 | ORAL_TABLET | Freq: Four times a day (QID) | ORAL | Status: DC | PRN
Start: 2016-12-30 — End: 2016-12-31
  Administered 2016-12-30 – 2016-12-31 (×3): 1 via ORAL
  Filled 2016-12-30 (×3): qty 1

## 2016-12-30 MED ORDER — TIOTROPIUM BROMIDE MONOHYDRATE 18 MCG IN CAPS
18.0000 ug | ORAL_CAPSULE | Freq: Every day | RESPIRATORY_TRACT | Status: DC
  Administered 2016-12-31 – 2017-01-02 (×3): 18 ug via RESPIRATORY_TRACT
  Filled 2016-12-30: qty 5

## 2016-12-30 MED ORDER — ALBUTEROL SULFATE (2.5 MG/3ML) 0.083% IN NEBU: 2.5000 mg | INHALATION_SOLUTION | RESPIRATORY_TRACT | Status: DC | PRN

## 2016-12-30 MED ORDER — ONDANSETRON HCL 4 MG/2ML IJ SOLN
4.0000 mg | Freq: Four times a day (QID) | INTRAMUSCULAR | Status: DC | PRN
  Administered 2016-12-30: 4 mg via INTRAVENOUS
  Filled 2016-12-30: qty 2

## 2016-12-30 MED ORDER — SODIUM CHLORIDE 0.9% FLUSH
3.0000 mL | Freq: Two times a day (BID) | INTRAVENOUS | Status: DC
  Administered 2016-12-30 – 2017-01-02 (×6): 3 mL via INTRAVENOUS

## 2016-12-30 MED ORDER — ACETAMINOPHEN 650 MG RE SUPP: 650.0000 mg | Freq: Four times a day (QID) | RECTAL | Status: DC | PRN

## 2016-12-30 MED ORDER — ONDANSETRON HCL 4 MG PO TABS: 4.0000 mg | ORAL_TABLET | Freq: Four times a day (QID) | ORAL | Status: DC | PRN

## 2016-12-30 NOTE — ED Notes (Signed)
Admitting MD at bedside at this time.

## 2016-12-30 NOTE — ED Notes (Signed)
This RN increased NCO2 to 4L due to maintaining saturation 89%. Pt. Maintaining 92% O2 at this time.

## 2016-12-30 NOTE — ED Triage Notes (Signed)
Pt ems from home for left leg pain that started this am. Pt with hx CHF, COPD, Sickle Cell states that pain feels similar to [place] crisis. Pt also noted to be bradycardic and hypotensive.

## 2016-12-30 NOTE — ED Notes (Signed)
US at bedside at this time 

## 2016-12-30 NOTE — Progress Notes (Signed)
Patient was in Atrial Fib in ED. Has converted to SR with some abnormalities.

## 2016-12-30 NOTE — Progress Notes (Signed)
Patient admitted from the ED in Sickle Cell Crisis, bradycardia, and flu smptoms.  Says she took her dig this morning and she does not check her pulse rate prior to taking her dig.  Dr./ Sudini notified, wants a 12 - lead EKG.

## 2016-12-30 NOTE — ED Provider Notes (Signed)
[facility] Emergency Department Provider Note  ____________________________________________  Time seen: Approximately 1:19 PM  I have reviewed the triage vital signs and the nursing notes.   HISTORY  Chief Complaint Leg Pain   HPI Shannon Schneider is a 46 y.o. female history of CHF (EF of 35-40%), COPD on 3L [place], sickle cell anemia who presents for evaluation of the right leg pain.Patient reports that this is classic presentation for her sickle cell pain crisis. She reports that she hasn't had an episode of pain crisis in a very long time and therefore she has no pain medicine at home. She reports a week of cough productive of yellow sputum, congestion, and a few days of diarrhea and vomiting. This morning she woke up with pain in her right leg which is sharp, severe, constant, nonradiating. Patient denies chest pain, shortness of breath, or requiring any more oxygen than her baseline. Patient's vaccinations are up-to-date including influenza. Patient reports compliance with all of her medications. She reports that her Lasix and had to be decreased from 40 daily to 20 every other day in the last few months due to her blood pressure being low. She reports that her baseline blood pressure home is usually with systolics in the 123XX123. She denies fever or chills. She denies personal or family history of blood clots, recent travel or immobilization, hemoptysis, exogenous hormones. Patient 's cardiologist is Dr. Humphrey Rolls  Past Medical History:  Diagnosis Date  . Congestive heart failure (CHF) ([place]) 06/27/15  . COPD (chronic obstructive pulmonary disease) ([place]) 06/27/15  . 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 Sickle cell anemia [facility])     Patient Active Problem List   Diagnosis Date Noted  . Mass of perirectal soft tissue 06/22/2015    Past Surgical History:  Procedure Laterality Date  . CESAREAN SECTION  1989  . CHOLECYSTECTOMY  1996  . EUS N/A 07/13/2015   Procedure: LOWER  ENDOSCOPIC ULTRASOUND (EUS);  Surgeon: Cora Daniels, MD;  Location: [facility] ENDOSCOPY;  Service: Endoscopy;  Laterality: N/A;    Prior to Admission medications   Medication Sig Start Date End Date Taking? Authorizing Provider  albuterol (PROVENTIL HFA;VENTOLIN HFA) 108 (90 Base) MCG/ACT inhaler Inhale 2 puffs into the lungs every 6 (six) hours as needed for wheezing. 09/16/16  Yes Historical Provider, MD  carvedilol (COREG) 3.125 MG tablet Take 3.125 mg by mouth 2 (two) times daily with a meal.    Yes Historical Provider, MD  citalopram (CELEXA) 10 MG tablet Take 10 mg by mouth at bedtime.   Yes Historical Provider, MD  digoxin (LANOXIN) 0.25 MG tablet Take 250 mcg by mouth daily.    Yes Historical Provider, MD  ergocalciferol (VITAMIN D2) 50000 units capsule Take 50,000 Units by mouth once a week.   Yes Historical Provider, MD  furosemide (LASIX) 40 MG tablet Take 40 mg by mouth.   Yes Historical Provider, MD  loratadine (CLARITIN) 10 MG tablet Take 10 mg by mouth daily.   Yes Historical Provider, MD  sacubitril-valsartan (ENTRESTO) 24-26 MG Take 1 tablet by mouth 2 (two) times daily.   Yes Historical Provider, MD  Tiotropium Bromide Monohydrate (SPIRIVA RESPIMAT) 2.5 MCG/ACT AERS Inhale 1 puff into the lungs 2 (two) times daily. 05/13/16  Yes Historical Provider, MD  OXYGEN Inhale 2 L into the lungs continuous.     Historical Provider, MD    Allergies Patient has no known allergies.  Family History  Problem Relation Age of Onset  . Renal Disease  Mother   . Diabetes Mother   . Hypertension Mother   . Emphysema Father     Social History Social History  Substance Use Topics  . Smoking status: Current Every Day Smoker    Packs/day: 0.25    Years: 20.00    Types: Cigarettes  . Smokeless tobacco: Never Used  . Alcohol use 0.0 oz/week     Comment: occasionally    Review of Systems  Constitutional: Negative for fever. Eyes: Negative for visual changes. ENT: Negative for sore  throat. Neck: No neck pain  Cardiovascular: Negative for chest pain. Respiratory: Negative for shortness of breath. + productive cough and congestion Gastrointestinal: Negative for abdominal pain. + vomiting and diarrhea. Genitourinary: Negative for dysuria. Musculoskeletal: Negative for back pain. + RLE pain Skin: Negative for rash. Neurological: Negative for headaches, weakness or numbness. Psych: No SI or HI  ____________________________________________   PHYSICAL EXAM:  VITAL SIGNS: Vitals:   12/30/16 1345 12/30/16 1400  BP: (!) 83/51 (!) 81/47  Pulse:  (!) 44  Resp: 19 (!) 23  Temp:     Constitutional: Alert and oriented, mild distress due to pain.  HEENT:      Head: Normocephalic and atraumatic.         Eyes: Conjunctivae are normal. Sclera is icteric. EOMI. PERRL      Mouth/Throat: Mucous membranes are moist.       Neck: Supple with no signs of meningismus. Cardiovascular: Bradycardic with regular rhythm. No murmurs, gallops, or rubs. 2+ symmetrical distal pulses are present in all extremities. No JVD. Respiratory: Normal respiratory effort. Lungs are clear to auscultation bilaterally. No wheezes, crackles, or rhonchi.  Gastrointestinal: Soft, non tender, and non distended with positive bowel sounds. No rebound or guarding. Musculoskeletal: Full painless ROM of b/l LE joints, no swelling, erythema, or warmth, legs are symmetric Neurologic: Normal speech and language. Face is symmetric. Moving all extremities. No gross focal neurologic deficits are appreciated. Skin: Skin is warm, dry and intact. No rash noted. Psychiatric: Mood and affect are normal. Speech and behavior are normal.  ____________________________________________   LABS (all labs ordered are listed, but only abnormal results are displayed)  Labs Reviewed  CBC WITH DIFFERENTIAL/PLATELET - Abnormal; Notable for the following:       Result Value   RBC 3.32 (*)    MCV 111.9 (*)    MCH 39.9 (*)    RDW  19.9 (*)    nRBC 14 (*)    Neutro Abs 9.0 (*)    All other components within normal limits  RETICULOCYTES - Abnormal; Notable for the following:    Retic Ct Pct 11.1 (*)    RBC. 3.32 (*)    Retic Count, Manual 368.5 (*)    All other components within normal limits  BASIC METABOLIC PANEL - Abnormal; Notable for the following:    Sodium 118 (*)    Chloride 88 (*)    CO2 18 (*)    Glucose, Bld 147 (*)    BUN 25 (*)    Creatinine, Ser 1.07 (*)    Calcium 8.8 (*)    All other components within normal limits  HEPATIC FUNCTION PANEL - Abnormal; Notable for the following:    Total Bilirubin 9.8 (*)    Bilirubin, Direct 4.6 (*)    Indirect Bilirubin 5.2 (*)    All other components within normal limits  DIGOXIN LEVEL - Abnormal; Notable for the following:    Digoxin Level 2.1 (*)    All other  components within normal limits  INFLUENZA PANEL BY PCR (TYPE A & B, H1N1)  PATHOLOGIST SMEAR REVIEW  BILIRUBIN, DIRECT   ____________________________________________  EKG  ED ECG REPORT I, Rudene Re, the attending physician, personally viewed and interpreted this ECG.   Atrial fibrillation, rate of 50, right bundle branch block, RAD,T-wave inversions on lateral and inferior leads, no ST elevation. Afib is new from prior. ____________________________________________  RADIOLOGY  CXR: Cardiomegaly. Unchanged linear opacities lung bases bilaterally favored to represent atelectasis/scarring. Infection not excluded. ____________________________________________   PROCEDURES  Procedure(s) performed: None Procedures Critical Care performed:  None ____________________________________________   INITIAL IMPRESSION / ASSESSMENT AND PLAN / ED COURSE  46 y.o. female history of CHF (EF of 35-40%), COPD on 3L [place], sickle cell anemia who presents for evaluation of the right leg pain similar to prior episodes of pain crisis in the setting of a week of flulike symptoms including productive  cough, vomiting, diarrhea, congestion. Patient is in mild distress due to pain, vital signs show systolics in the 123XX123 which is patient's baseline, she is afebrile, patient is bradycardic and found to be in atrial fibrillation which is a new diagnosis for her. She is on digoxin and carvedilol. We'll check a dig level. Will check Flu, blood work including reticulocyte count, chest x-ray to evaluate for evidence of pneumonia. Will get doppler to rule out DVT although low suspicion as leg pain is consistent with patient's pain crisis. We'll give IV fluids, Zofran, and allotted for the pain. Watch patient carefully on telemetry.  Clinical Course as of Dec 30 1516  Mon Dec 30, 2016  1434 Patient with new A. fib and hyponatremia with sodium of 118. Mildly elevated creatinine. Digoxin level was slightly elevated at 2.1 with normal potassium of 3.8. Chest x-ray with no evidence of new infiltrate. A reticulocyte count appropriately elevated. Hemoglobin is at baseline. Patient's pain is markedly improved at this time. We'll admit to the hospitalist.  [CV]    Clinical Course User Index [CV] Rudene Re, MD    Pertinent labs & imaging results that were available during my care of the patient were reviewed by me and considered in my medical decision making (see chart for details).    ____________________________________________   FINAL CLINICAL IMPRESSION(S) / ED DIAGNOSES  Final diagnoses:  Sickle cell pain crisis (HCC)  Hyponatremia  Atrial fibrillation, unspecified type (HCC)  Vomiting and diarrhea      NEW MEDICATIONS STARTED DURING THIS VISIT:  New Prescriptions   No medications on file     Note:  This document was prepared using Dragon voice recognition software and may include unintentional dictation errors.    Rudene Re, MD 12/30/16 [phone]

## 2016-12-30 NOTE — ED Notes (Signed)
MD to beside for update.

## 2016-12-30 NOTE — H&P (Signed)
[place] [facility] [place] NAME: Shannon Schneider    MR#:  [id number]  DATE OF BIRTH:  12-23-1971  DATE OF ADMISSION:  12/30/2016  PRIMARY CARE PHYSICIAN: Kasandra Knudsen, NP   REQUESTING/REFERRING PHYSICIAN: Dr. Alfred Levins  CHIEF COMPLAINT:   Chief Complaint  Patient presents with  . Leg Pain    HISTORY OF PRESENT ILLNESS:  Shannon Schneider  is a 46 y.o. 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 Patient has had nausea, vomiting and diarrhea. These are improved but her pain is persistent. She mentions she gets this pain every time she gets a sickle cell crisis. It has been many months she had this pain. In the emergency room blood work showed hyponatremia, hyperbilirubinemia and patient is being admitted to the hospitalist service. Chest x-ray shows no pulmonary edema or infiltrates.   PAST MEDICAL HISTORY:   Past Medical History:  Diagnosis Date  . Congestive heart failure (CHF) ([place]) 06/27/15  . COPD (chronic obstructive pulmonary disease) ([place]) 06/27/15  . Hyperbilirubinemia   . 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 Sickle cell anemia (HCC)     PAST SURGICAL HISTORY:   Past Surgical History:  Procedure Laterality Date  . CESAREAN SECTION  1989  . CHOLECYSTECTOMY  1996  . EUS N/A 07/13/2015   Procedure: LOWER ENDOSCOPIC ULTRASOUND (EUS);  Surgeon: Cora Daniels, MD;  Location: [facility] ENDOSCOPY;  Service: Endoscopy;  Laterality: N/A;    SOCIAL HISTORY:   Social History  Substance Use Topics  . Smoking status: Current Every Day Smoker    Packs/day: 0.25    Years: 20.00    Types: Cigarettes  . Smokeless tobacco: Never Used  . Alcohol use 0.0 oz/week     Comment: occasionally    FAMILY HISTORY:   Family History  Problem Relation Age of Onset  . Renal  Disease Mother   . Diabetes Mother   . Hypertension Mother   . Emphysema Father     DRUG ALLERGIES:  No Known Allergies  REVIEW OF SYSTEMS:   Review of Systems  Constitutional: Positive for malaise/fatigue. Negative for chills, fever and weight loss.  HENT: Negative for hearing loss and nosebleeds.   Eyes: Negative for blurred vision, double vision and pain.  Respiratory: Positive for cough. Negative for hemoptysis, sputum production, shortness of breath and wheezing.   Cardiovascular: Negative for chest pain, palpitations, orthopnea and leg swelling.  Gastrointestinal: Positive for diarrhea, nausea and vomiting. Negative for abdominal pain and constipation.  Genitourinary: Negative for dysuria and hematuria.  Musculoskeletal: Negative for back pain, falls and myalgias.  Skin: Negative for rash.  Neurological: Positive for weakness. Negative for dizziness, tremors, sensory change, speech change, focal weakness, seizures and headaches.  Endo/Heme/Allergies: Does not bruise/bleed easily.  Psychiatric/Behavioral: Negative for depression and memory loss. The patient is not nervous/anxious.    MEDICATIONS AT HOME:   Prior to Admission medications   Medication Sig Start Date End Date Taking? Authorizing Provider  albuterol (PROVENTIL HFA;VENTOLIN HFA) 108 (90 Base) MCG/ACT inhaler Inhale 2 puffs into the lungs every 6 (six) hours as needed for wheezing. 09/16/16  Yes Historical Provider, MD  carvedilol (COREG) 3.125 MG tablet Take 3.125 mg by mouth 2 (two) times daily with a meal.    Yes Historical Provider, MD  citalopram (CELEXA) 10 MG tablet Take 10 mg  by mouth at bedtime.   Yes Historical Provider, MD  digoxin (LANOXIN) 0.25 MG tablet Take 250 mcg by mouth daily.    Yes Historical Provider, MD  ergocalciferol (VITAMIN D2) 50000 units capsule Take 50,000 Units by mouth once a week.   Yes Historical Provider, MD  furosemide (LASIX) 40 MG tablet Take 40 mg by mouth.   Yes Historical  Provider, MD  loratadine (CLARITIN) 10 MG tablet Take 10 mg by mouth daily.   Yes Historical Provider, MD  sacubitril-valsartan (ENTRESTO) 24-26 MG Take 1 tablet by mouth 2 (two) times daily.   Yes Historical Provider, MD  Tiotropium Bromide Monohydrate (SPIRIVA RESPIMAT) 2.5 MCG/ACT AERS Inhale 1 puff into the lungs 2 (two) times daily. 05/13/16  Yes Historical Provider, MD  OXYGEN Inhale 2 L into the lungs continuous.     Historical Provider, MD   VITAL SIGNS:  Blood pressure 99/68, pulse 66, temperature (!) 96.3 F (35.7 C), temperature source Rectal, resp. rate 19, height 5' (1.524 m), weight 73 kg (160 lb 15 oz), last menstrual period 12/30/2016, SpO2 91 %.  PHYSICAL EXAMINATION:  Physical Exam  GENERAL:  46 y.o.-year-old patient lying in the bed. EYES: Pupils equal, round, reactive to light and accommodation.  scleral icterus Present. Extraocular muscles intact.  HEENT: Head atraumatic, normocephalic. Oropharynx and nasopharynx clear. No oropharyngeal erythema, moist oral mucosa. NECK:  Supple, no jugular venous distention. No thyroid enlargement, no tenderness.  LUNGS: Normal breath sounds bilaterally, no wheezing, rales, rhonchi. No use of accessory muscles of respiration.  CARDIOVASCULAR: S1, S2 normal. No murmurs, rubs, or gallops.  ABDOMEN: Soft, nontender, nondistended. Bowel sounds present. No organomegaly or mass. EXTREMITIES: No pedal edema, cyanosis, or clubbing. + 2 pedal & radial pulses b/l.   NEUROLOGIC: Cranial nerves II through XII are intact. No focal Motor or sensory deficits appreciated b/l PSYCHIATRIC: The patient is alert and oriented x 3. Good affect.  SKIN: No obvious rash, lesion, or ulcer.   LABORATORY PANEL:   CBC  Recent Labs Lab 12/30/16 1311  WBC 10.7  HGB 13.2  HCT 37.1  PLT 190   ------------------------------------------------------------------------------------------------------------------  Chemistries   Recent Labs Lab 12/30/16 1311   NA 118*  K 3.8  CL 88*  CO2 18*  GLUCOSE 147*  BUN 25*  CREATININE 1.07*  CALCIUM 8.8*  AST 27  ALT 20  ALKPHOS 110  BILITOT 9.8*   ------------------------------------------------------------------------------------------------------------------  Cardiac Enzymes No results for input(s): TROPONINI in the last 168 hours. ------------------------------------------------------------------------------------------------------------------  RADIOLOGY:  Dg Chest Portable 1 View  Result Date: 12/30/2016 CLINICAL DATA:  Patient with left leg pain. Bradycardia. Hypotension. EXAM: PORTABLE CHEST 1 VIEW COMPARISON:  Chest radiograph 06/19/2015. FINDINGS: Monitoring leads overlie the patient. Stable cardiomegaly. Unchanged linear opacities within the lung bases bilaterally. Small bilateral pleural effusions. Osseous skeleton is unremarkable. IMPRESSION: Cardiomegaly. Unchanged linear opacities lung bases bilaterally favored to represent atelectasis/scarring. Infection not excluded. Small bilateral pleural effusions. Electronically Signed   By: Lovey Newcomer M.D.   On: 12/30/2016 13:49     IMPRESSION AND PLAN:   * Hyponatremia due to dehydration. Seems acute on chronic. Asymptomatic. Patient has had diarrhea and decreased oral intake. Was started on gentle IV hydration due to history of systolic CHF. Monitor sodium. Consult nephrology. Monitor input and output. Serial sodium checks.  * Left leg pain. Patient mentions she has this pain every time she felt sick or has sickle cell crisis. Will add when necessary pain medications. Normal hemoglobin.  * Hyperbilirubinemia, direct and  indirect She has had slowly worsening hyperbilirubinemia over the past few months. Follows at [facility]. She is supposed to get a liver biopsy and is being scheduled for this. Her hyperbilirubinemia is worse today likely due to hemoconcentration and progressively worsening.  * Chronic systolic CHF. No signs  of fluid overload at this time. Hold Lasix.  * Chronic respiratory failure due to COPD and CHF is stable. Continue oxygen.  * Right lower extremity pain Due to sickle cell crisis. Will check ultrasound.  * Chronic hypotension due to cardiomyopathy.  * DVT prophylaxis with Lovenox.  All the records are reviewed and case discussed with ED provider. Management plans discussed with the patient, family and they are in agreement.  CODE STATUS: FULL CODE  TOTAL TIME TAKING CARE OF THIS PATIENT: 40 minutes.   Hillary Bow R M.D on 12/30/2016 at 4:49 PM  Between 7am to 6pm - Pager - [phone]  After 6pm go to [email or web address] - password [email or web address] [facility]  Office  [phone]  CC: Primary care physician; Kasandra Knudsen, NP  Note: This dictation was prepared with Dragon dictation along with smaller phrase technology. Any transcriptional errors that result from this process are unintentional.

## 2016-12-31 ENCOUNTER — Ambulatory Visit: Payer: 59

## 2016-12-31 ENCOUNTER — Inpatient Hospital Stay
Admit: 2016-12-31 | Discharge: 2016-12-31 | Disposition: A | Discharge status: Home / Self Care | Payer: BLUE CROSS/BLUE SHIELD | Attending: Cardiovascular Disease | Admitting: Cardiovascular Disease

## 2016-12-31 DIAGNOSIS — R112 Nausea with vomiting, unspecified: Secondary | ICD-10-CM

## 2016-12-31 DIAGNOSIS — Z9981 Dependence on supplemental oxygen: Secondary | ICD-10-CM

## 2016-12-31 DIAGNOSIS — M79606 Pain in leg, unspecified: Secondary | ICD-10-CM

## 2016-12-31 DIAGNOSIS — I517 Cardiomegaly: Secondary | ICD-10-CM

## 2016-12-31 DIAGNOSIS — Z79899 Other long term (current) drug therapy: Secondary | ICD-10-CM

## 2016-12-31 DIAGNOSIS — R5383 Other fatigue: Secondary | ICD-10-CM

## 2016-12-31 DIAGNOSIS — R0902 Hypoxemia: Secondary | ICD-10-CM

## 2016-12-31 DIAGNOSIS — F17210 Nicotine dependence, cigarettes, uncomplicated: Secondary | ICD-10-CM

## 2016-12-31 DIAGNOSIS — R197 Diarrhea, unspecified: Secondary | ICD-10-CM

## 2016-12-31 DIAGNOSIS — D5700 Hb-SS disease with crisis, unspecified: Secondary | ICD-10-CM

## 2016-12-31 DIAGNOSIS — J449 Chronic obstructive pulmonary disease, unspecified: Secondary | ICD-10-CM

## 2016-12-31 DIAGNOSIS — R5381 Other malaise: Secondary | ICD-10-CM

## 2016-12-31 DIAGNOSIS — E871 Hypo-osmolality and hyponatremia: Principal | ICD-10-CM

## 2016-12-31 LAB — INFLUENZA PANEL BY PCR (TYPE A & B)
INFLAPCR: NEGATIVE
INFLBPCR: NEGATIVE

## 2016-12-31 LAB — COMPREHENSIVE METABOLIC PANEL
ALK PHOS: 118 U/L (ref 38–126)
ALT: 17 U/L (ref 14–54)
AST: 20 U/L (ref 15–41)
Albumin: 4 g/dL (ref 3.5–5.0)
Anion gap: 13 (ref 5–15)
BILIRUBIN TOTAL: 9.1 mg/dL — AB (ref 0.3–1.2)
BUN: 28 mg/dL — AB (ref 6–20)
CALCIUM: 9.2 mg/dL (ref 8.9–10.3)
CO2: 18 mmol/L — ABNORMAL LOW (ref 22–32)
CREATININE: 1.14 mg/dL — AB (ref 0.44–1.00)
Chloride: 92 mmol/L — ABNORMAL LOW (ref 101–111)
GFR calc Af Amer: >60 mL/min (ref >60)
GFR, EST NON AFRICAN AMERICAN: 57 mL/min — AB (ref >60)
GLUCOSE: 98 mg/dL (ref 65–99)
Potassium: 4.5 mmol/L (ref 3.5–5.1)
Sodium: 123 mmol/L — ABNORMAL LOW (ref 135–145)
TOTAL PROTEIN: 7.5 g/dL (ref 6.5–8.1)

## 2016-12-31 LAB — CBC
HEMATOCRIT: 39.2 % (ref 35.0–47.0)
HEMOGLOBIN: 14.1 g/dL (ref 12.0–16.0)
MCH: 40.1 pg — ABNORMAL HIGH (ref 26.0–34.0)
MCHC: 36 g/dL (ref 32.0–36.0)
MCV: 111.3 fL — AB (ref 80.0–100.0)
Platelets: 212 10*3/uL (ref 150–440)
RBC: 3.52 MIL/uL — AB (ref 3.80–5.20)
RDW: 19.2 % — AB (ref 11.5–14.5)
WBC: 11.4 10*3/uL — AB (ref 3.6–11.0)

## 2016-12-31 LAB — MAGNESIUM: Magnesium: 1.9 mg/dL (ref 1.7–2.4)

## 2016-12-31 LAB — TSH: TSH: 1.066 u[IU]/mL (ref 0.350–4.500)

## 2016-12-31 LAB — SODIUM: Sodium: 124 mmol/L — ABNORMAL LOW (ref 135–145)

## 2016-12-31 LAB — OSMOLALITY, URINE: OSMOLALITY UR: 272 mosm/kg — AB (ref 300–900)

## 2016-12-31 LAB — PROTIME-INR
INR: 1.26
Prothrombin Time: 15.9 seconds — ABNORMAL HIGH (ref 11.4–15.2)

## 2016-12-31 LAB — SODIUM, URINE, RANDOM

## 2016-12-31 LAB — CREATININE, URINE, RANDOM: Creatinine, Urine: 59 mg/dL

## 2016-12-31 LAB — PATHOLOGIST SMEAR REVIEW

## 2016-12-31 LAB — OSMOLALITY: Osmolality: 268 mOsm/kg — ABNORMAL LOW (ref 275–295)

## 2016-12-31 LAB — LACTATE DEHYDROGENASE: LDH: 311 U/L — ABNORMAL HIGH (ref 98–192)

## 2016-12-31 LAB — BILIRUBIN, DIRECT: Bilirubin, Direct: 4.5 mg/dL — ABNORMAL HIGH (ref 0.1–0.5)

## 2016-12-31 LAB — GLUCOSE, CAPILLARY
Glucose-Capillary: 103 mg/dL — ABNORMAL HIGH (ref 65–99)
Glucose-Capillary: 86 mg/dL (ref 65–99)

## 2016-12-31 LAB — CORTISOL: Cortisol, Plasma: 20.6 ug/dL

## 2016-12-31 MED ORDER — SODIUM CHLORIDE 0.9 % IV BOLUS (SEPSIS)
1000.0000 mL | Freq: Once | INTRAVENOUS | Status: AC
  Administered 2016-12-31: 1000 mL via INTRAVENOUS

## 2016-12-31 MED ORDER — SODIUM CHLORIDE 0.9 % IV BOLUS (SEPSIS): 1000.0000 mL | Freq: Once | INTRAVENOUS | Status: DC

## 2016-12-31 MED ORDER — NICOTINE 21 MG/24HR TD PT24
21.0000 mg | MEDICATED_PATCH | Freq: Every day | TRANSDERMAL | Status: DC
  Administered 2017-01-02: 21 mg via TRANSDERMAL
  Filled 2016-12-31 (×2): qty 1

## 2016-12-31 MED ORDER — ALBUTEROL SULFATE (2.5 MG/3ML) 0.083% IN NEBU
2.5000 mg | INHALATION_SOLUTION | RESPIRATORY_TRACT | Status: DC
  Administered 2016-12-31 – 2017-01-01 (×5): 2.5 mg via RESPIRATORY_TRACT
  Filled 2016-12-31 (×6): qty 3

## 2016-12-31 MED ORDER — METHYLPREDNISOLONE SODIUM SUCC 125 MG IJ SOLR
60.0000 mg | Freq: Four times a day (QID) | INTRAMUSCULAR | Status: DC
  Administered 2016-12-31 – 2017-01-02 (×9): 60 mg via INTRAVENOUS
  Filled 2016-12-31 (×9): qty 2

## 2016-12-31 MED ORDER — BUDESONIDE 0.25 MG/2ML IN SUSP
0.2500 mg | Freq: Two times a day (BID) | RESPIRATORY_TRACT | Status: DC
  Administered 2016-12-31 – 2017-01-02 (×4): 0.25 mg via RESPIRATORY_TRACT
  Filled 2016-12-31 (×5): qty 2

## 2016-12-31 MED ORDER — SODIUM CHLORIDE 0.9 % IV SOLN
2.0000 | Freq: Once | INTRAVENOUS | Status: AC
  Administered 2016-12-31: 2 via INTRAVENOUS
  Filled 2016-12-31: qty 80

## 2016-12-31 MED ORDER — HYDROCODONE-ACETAMINOPHEN 10-325 MG PO TABS
1.0000 | ORAL_TABLET | ORAL | Status: DC | PRN
  Administered 2016-12-31 – 2017-01-02 (×12): 1 via ORAL
  Filled 2016-12-31 (×12): qty 1

## 2016-12-31 MED ORDER — ENSURE ENLIVE PO LIQD
237.0000 mL | Freq: Three times a day (TID) | ORAL | Status: DC
  Administered 2016-12-31 – 2017-01-02 (×5): 237 mL via ORAL

## 2016-12-31 NOTE — Consult Note (Signed)
Hematology/Oncology Consult note [facility] [phone] [phone]  Patient Care Team: Kasandra Knudsen, NP as PCP - General (Nurse Practitioner) Kasandra Knudsen, NP as Nurse Practitioner (Nurse Practitioner) Robert Bellow, MD (General Surgery) Kasandra Knudsen, NP (Nurse Practitioner)   Name of the patient: Shannon Schneider  NY:[id number]  08/24/1971    Reason for referral- sickle cell crisis   Referring physician- Dr. 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 She was last seen by Dr. Festus Aloe from [facility] on 05/31/2016 who is her primary hematologist. She does have other chronic medical issues including COPD for which she follows up with pulmonology as well as cardiomyopathy with an EF 25% and sees cardiology. She was diagnosed with sickle cell disease as a child and had frequent pain episodes back 9 which became less frequent as she grew older. She has not had any overt strokes, or adenopathy or avascular necrosis. During that visit her CBC showed a white count of 4.5, H&H of 14.5/45.8 and MCV of 119.7 and a platelet count of 303. Her total bilirubin was 6.5 with a direct bilirubin of 2.4 back then. She has also been seen by Dr. Gustavo Lah from GI for evaluation of elevated bilirubin and no obvious cause was found and liver biopsy was to be considered down the line   Patient was admitted to the hospital on 12/30/2016 with symptoms of left leg pain and generally not feeling well. She also complained of some nausea vomiting and diarrhea. She was noted to have an elevated total bilirubin of 9.8 with an indirect bilirubin of 5.2 and direct bilirubin of 4.6. AST ALT and alkaline phosphatase was within normal limits. Albumin was normal at 3.7. CBC done today showed a white count of 11.4, H&H of  14.1/39.2 and a platelet count of 212. LDH and haptoglobin is pending and reticulocyte count was elevated at 11.1.  She was also noted to be hyponatremic with a sodium of 123. Chest x-ray showed cardiomegaly and unchanged linear opacities at the lung bases likely atelectasis or scarring. Infection not excluded. Doppler of bilateral lower extremity showed no evidence of DVT.      Review of systems- Review of Systems  Constitutional: Positive for malaise/fatigue.  Gastrointestinal: Positive for diarrhea, nausea and vomiting.  Musculoskeletal:       Positive for right leg pain    No Known Allergies  Patient Active Problem List   Diagnosis Date Noted  . Hyponatremia 12/30/2016  . Mass of perirectal soft tissue 06/22/2015     Past Medical History:  Diagnosis Date  . Congestive heart failure (CHF) ([place]) 06/27/15  . COPD (chronic obstructive pulmonary disease) ([place]) 06/27/15  . Hyperbilirubinemia   . 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 Sickle cell anemia ([facility])      Past Surgical History:  Procedure Laterality Date  . CESAREAN SECTION  1989  . CHOLECYSTECTOMY  1996  . EUS N/A 07/13/2015   Procedure: LOWER ENDOSCOPIC ULTRASOUND (EUS);  Surgeon: Cora Daniels, MD;  Location: [facility] ENDOSCOPY;  Service: Endoscopy;  Laterality: N/A;    Social History   Social History  . Marital status: Married    Spouse name: N/A  . Number of children: N/A  . Years of education: N/A   Occupational History  . Not on file.   Social History Main Topics  . Smoking  status: Current Every Day Smoker    Packs/day: 0.25    Years: 20.00    Types: Cigarettes  . Smokeless tobacco: Never Used  . Alcohol use 0.0 oz/week     Comment: occasionally  . Drug use: No  . Sexual activity: Not on file   Other Topics Concern  . Not on file   Social History Narrative   ** Merged History Encounter **         Family History  Problem Relation Age of Onset  . Renal Disease Mother   . Diabetes Mother   .  Hypertension Mother   . Emphysema Father      Current Facility-Administered Medications:  .  acetaminophen (TYLENOL) tablet 650 mg, 650 mg, Oral, Q6H PRN, 650 mg at 12/30/16 2202 **OR** acetaminophen (TYLENOL) suppository 650 mg, 650 mg, Rectal, Q6H PRN, Srikar Sudini, MD .  albuterol (PROVENTIL) (2.5 MG/3ML) 0.083% nebulizer solution 2.5 mg, 2.5 mg, Nebulization, Q4H, Rima Vaickute, MD .  budesonide (PULMICORT) nebulizer solution 0.25 mg, 0.25 mg, Nebulization, BID, Theodoro Grist, MD .  citalopram (CELEXA) tablet 10 mg, 10 mg, Oral, QHS, Srikar Sudini, MD, 10 mg at 12/30/16 2202 .  digoxin immune fab (DIGIFAB) 2 vial in sodium chloride 0.9 % 50 mL IVPB, 2 vial, Intravenous, Once, Dionisio David, MD .  enoxaparin (LOVENOX) injection 40 mg, 40 mg, Subcutaneous, Q24H, Srikar Sudini, MD, 40 mg at 12/30/16 1756 .  feeding supplement (ENSURE ENLIVE) (ENSURE ENLIVE) liquid 237 mL, 237 mL, Oral, TID BM, Theodoro Grist, MD, 237 mL at 12/31/16 1234 .  HYDROcodone-acetaminophen (NORCO) 10-325 MG per tablet 1 tablet, 1 tablet, Oral, Q4H PRN, Theodoro Grist, MD, 1 tablet at 12/31/16 1234 .  loratadine (CLARITIN) tablet 10 mg, 10 mg, Oral, Daily, Srikar Sudini, MD, 10 mg at 12/31/16 0800 .  methylPREDNISolone sodium succinate (SOLU-MEDROL) 125 mg/2 mL injection 60 mg, 60 mg, Intravenous, Q6H, Theodoro Grist, MD, 60 mg at 12/31/16 1234 .  morphine 4 MG/ML injection 4 mg, 4 mg, Intravenous, Q4H PRN, Lance Coon, MD, 4 mg at 12/30/16 2349 .  nicotine (NICODERM CQ - dosed in mg/24 hours) patch 21 mg, 21 mg, Transdermal, Daily, Theodoro Grist, MD .  ondansetron (ZOFRAN) injection 4 mg, 4 mg, Intravenous, Once, Rudene Re, MD .  ondansetron [facility]) tablet 4 mg, 4 mg, Oral, Q6H PRN **OR** ondansetron (ZOFRAN) injection 4 mg, 4 mg, Intravenous, Q6H PRN, Hillary Bow, MD, 4 mg at 12/30/16 1822 .  polyethylene glycol (MIRALAX / GLYCOLAX) packet 17 g, 17 g, Oral, Daily PRN, Srikar Sudini, MD .  sodium chloride  flush (NS) 0.9 % injection 3 mL, 3 mL, Intravenous, Q12H, Srikar Sudini, MD, 3 mL at 12/31/16 0801 .  tiotropium [facility]) inhalation capsule 18 mcg, 18 mcg, Inhalation, Daily, Hillary Bow, MD, 18 mcg at 12/31/16 0801   Physical exam:  Vitals:   12/30/16 1938 12/31/16 0353 12/31/16 0908 12/31/16 1130  BP: (!) 85/62 (!) 88/62 99/64 99/60   Pulse: 77 63 (!) 49 75  Resp:  16  18  Temp:  97.4 F (36.3 C)  97.4 F (36.3 C)  TempSrc:  Oral  Oral  SpO2: 96% 92% 92% 90%  Weight:  150 lb 12.7 oz (68.4 kg)    Height:       Physical Exam  Constitutional: She is oriented to person, place, and time.  Fatigued, appears to be in mild distress from pain  HENT:  Head: Normocephalic and atraumatic.  Eyes: EOM are normal. Pupils are equal, round,  and reactive to light.  Neck: Normal range of motion.  Cardiovascular: Normal rate, regular rhythm and normal heart sounds.   Pulmonary/Chest:  Mild bibasilar crackles +  Abdominal: Soft. Bowel sounds are normal.  Musculoskeletal: She exhibits no edema or tenderness.  Neurological: She is alert and oriented to person, place, and time.  Skin: Skin is warm and dry.       CMP Latest Ref Rng & Units 12/31/2016  Glucose 65 - 99 mg/dL 98  BUN 6 - 20 mg/dL 28(H)  Creatinine 0.44 - 1.00 mg/dL 1.14(H)  Sodium 135 - 145 mmol/L 123(L)  Potassium 3.5 - 5.1 mmol/L 4.5  Chloride 101 - 111 mmol/L 92(L)  CO2 22 - 32 mmol/L 18(L)  Calcium 8.9 - 10.3 mg/dL 9.2  Total Protein 6.5 - 8.1 g/dL 7.5  Total Bilirubin 0.3 - 1.2 mg/dL 9.1(H)  Alkaline Phos 38 - 126 U/L 118  AST 15 - 41 U/L 20  ALT 14 - 54 U/L 17   CBC Latest Ref Rng & Units 12/31/2016  WBC 3.6 - 11.0 K/uL 11.4(H)  Hemoglobin 12.0 - 16.0 g/dL 14.1  Hematocrit 35.0 - 47.0 % 39.2  Platelets 150 - 440 K/uL 212    @IMAGES @  US Venous Img Lower Bilateral  Result Date: 12/30/2016 CLINICAL DATA:  Bilateral lower extremity pain, left greater than right since this morning. EXAM: BILATERAL LOWER EXTREMITY  VENOUS DOPPLER ULTRASOUND TECHNIQUE: Gray-scale sonography with graded compression, as well as color Doppler and duplex ultrasound were performed to evaluate the lower extremity deep venous systems from the level of the common femoral vein and including the common femoral, femoral, profunda femoral, popliteal and calf veins including the posterior tibial, peroneal and gastrocnemius veins when visible. The superficial great saphenous vein was also interrogated. Spectral Doppler was utilized to evaluate flow at rest and with distal augmentation maneuvers in the common femoral, femoral and popliteal veins. COMPARISON:  None. FINDINGS: RIGHT LOWER EXTREMITY Common Femoral Vein: No evidence of thrombus. Normal compressibility, respiratory phasicity and response to augmentation. Saphenofemoral Junction: No evidence of thrombus. Normal compressibility and flow on color Doppler imaging. Profunda Femoral Vein: No evidence of thrombus. Normal compressibility and flow on color Doppler imaging. Femoral Vein: No evidence of thrombus. Normal compressibility, respiratory phasicity and response to augmentation. Popliteal Vein: No evidence of thrombus. Normal compressibility, respiratory phasicity and response to augmentation. Calf Veins: No evidence of thrombus. Normal compressibility and flow on color Doppler imaging. Superficial Great Saphenous Vein: No evidence of thrombus. Normal compressibility and flow on color Doppler imaging. Venous Reflux:  None. Other Findings:  None. LEFT LOWER EXTREMITY Common Femoral Vein: No evidence of thrombus. Normal compressibility, respiratory phasicity and response to augmentation. Saphenofemoral Junction: No evidence of thrombus. Normal compressibility and flow on color Doppler imaging. Profunda Femoral Vein: No evidence of thrombus. Normal compressibility and flow on color Doppler imaging. Femoral Vein: No evidence of thrombus. Normal compressibility, respiratory phasicity and response to  augmentation. Popliteal Vein: No evidence of thrombus. Normal compressibility, respiratory phasicity and response to augmentation. Calf Veins: No evidence of thrombus. Normal compressibility and flow on color Doppler imaging. Superficial Great Saphenous Vein: No evidence of thrombus. Normal compressibility and flow on color Doppler imaging. Venous Reflux:  None. Other Findings:  None. IMPRESSION: No evidence of deep venous thrombosis. Electronically Signed   By: Abelardo Diesel M.D.   On: 12/30/2016 16:57   Dg Chest Portable 1 View  Result Date: 12/30/2016 CLINICAL DATA:  Patient with left leg pain. Bradycardia. Hypotension. EXAM:  PORTABLE CHEST 1 VIEW COMPARISON:  Chest radiograph 06/19/2015. FINDINGS: Monitoring leads overlie the patient. Stable cardiomegaly. Unchanged linear opacities within the lung bases bilaterally. Small bilateral pleural effusions. Osseous skeleton is unremarkable. IMPRESSION: Cardiomegaly. Unchanged linear opacities lung bases bilaterally favored to represent atelectasis/scarring. Infection not excluded. Small bilateral pleural effusions. Electronically Signed   By: Lovey Newcomer M.D.   On: 12/30/2016 13:49    Assessment and plan- Patient is a 46 y.o. female with a h/o HbSS versus HbS HPFH admitted for hyponatremia and sickle cell crisis  1. Left leg pain- could be from sickle cell crisis. However patient has HbS along with HPFH. These patients typically do not have frequent crises. Patients last episode of crisis was 10 yrs ago. She has not required frequent transfusions. Her H/H is stable. Continue pain management. If her pain does not resolve after treating her underlying CHF- imaging of LLE could be considered. Probably her nausea/vomitting dehydration may have precipitated her crisis. However if she is febrile or her respiratory condition worsens, recommend repeat CXR and starting empiric antibiotics to prevent acute chest syndrome. Currently she has some acute decompensation in  her respiratory status probably from her CHF  2. Worsening hyperbilirubinemia- this is both direct and indirect. Although there is some reticulocytosis and probably some ongoing hemolysis; this does not explain her direct and indirect hyperbilirubinemia. She has  Been seen by [facility] in the past and I will defer further work up to them. No convincing evidence that this is from her sickle cell disease as she has a mild form of disease.recommend checking PT/INR to assess synthetic functions of the liver  Visit Diagnosis 1. Atrial fibrillation, unspecified type ([place])   2. Sickle cell pain crisis ([place])   3. Hyponatremia   4. Vomiting and diarrhea   5. Hypoxia     Dr. Randa Evens, MD, MPH [facility] Pager- [phone] 12/31/2016 1:38 PM

## 2016-12-31 NOTE — Progress Notes (Signed)
Pt HR dropped to the 30's. Telemetry showed EKG changes. Dr Marcille Blanco made aware. Cardio consult ordered.

## 2016-12-31 NOTE — Consult Note (Signed)
Date: 12/31/2016                  Patient Name:  Shannon Schneider  MRN: [id number]  DOB: Nov 17, 1971  Age / Sex: 46 y.o., female         PCP: Kasandra Knudsen, NP                 Service Requesting Consult: Internal medicine                 Reason for Consult: ARF            History of Present Illness: Patient is a 46 y.o. female with medical problems of Chronic systolic congestive heart failure EF 35-40%, sickle cell disease, who was admitted to [facility] on 12/30/2016 for evaluation of right leg pain. She states that it feels like sickle cell pain crisis. She also reports decreased appetite, nausea, diarrhea for the past few days. Lab results show low sodium of 118 at the time of admission. Baseline sodium appears to be 141 from June 2016. Today's level have improved to 123-124 range. 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 She was evaluated by general surgery. She was advised to follow-up with gynecologist. Nephrology consult has been requested to evaluate hyponatremia  Medications: Outpatient medications: Prescriptions Prior to Admission  Medication Sig Dispense Refill Last Dose  . albuterol (PROVENTIL HFA;VENTOLIN HFA) 108 (90 Base) MCG/ACT inhaler Inhale 2 puffs into the lungs every 6 (six) hours as needed for wheezing.   12/30/2016 at 0800  . carvedilol (COREG) 3.125 MG tablet Take 3.125 mg by mouth 2 (two) times daily with a meal.    12/29/2016 at 0800  . citalopram (CELEXA) 10 MG tablet Take 10 mg by mouth at bedtime.   12/29/2016 at 2000  . digoxin (LANOXIN) 0.25 MG tablet Take 250 mcg by mouth daily.    12/30/2016 at 0800  . ergocalciferol (VITAMIN D2) 50000 units capsule Take 50,000 Units by mouth once a week.   Past Week at Unknown time  . furosemide (LASIX) 40 MG  tablet Take 40 mg by mouth.   12/29/2016 at 0800  . loratadine (CLARITIN) 10 MG tablet Take 10 mg by mouth daily.   12/29/2016 at 0800  . sacubitril-valsartan (ENTRESTO) 24-26 MG Take 1 tablet by mouth 2 (two) times daily.   12/29/2016 at 2000  . Tiotropium Bromide Monohydrate (SPIRIVA RESPIMAT) 2.5 MCG/ACT AERS Inhale 1 puff into the lungs 2 (two) times daily.   12/30/2016 at 0800  . OXYGEN Inhale 2 L into the lungs continuous.    Taking    Current medications: Current Facility-Administered Medications  Medication Dose Route Frequency Provider Last Rate Last Dose  . acetaminophen (TYLENOL) tablet 650 mg  650 mg Oral Q6H PRN Hillary Bow, MD   650 mg at 12/30/16 2202   Or  . acetaminophen (TYLENOL) suppository 650 mg  650 mg Rectal Q6H PRN Srikar Sudini, MD      . albuterol (PROVENTIL) (2.5 MG/3ML) 0.083% nebulizer solution 2.5 mg  2.5 mg Nebulization Q4H Theodoro Grist, MD   2.5 mg at 12/31/16 1516  . budesonide (PULMICORT) nebulizer solution 0.25 mg  0.25 mg Nebulization BID Theodoro Grist, MD   0.25 mg at 12/31/16 1339  . digoxin immune fab (DIGIFAB) 2 vial in sodium chloride 0.9 % 50 mL  IVPB  2 vial Intravenous Once Dionisio David, MD      . enoxaparin (LOVENOX) injection 40 mg  40 mg Subcutaneous Q24H Hillary Bow, MD   40 mg at 12/30/16 1756  . feeding supplement (ENSURE ENLIVE) (ENSURE ENLIVE) liquid 237 mL  237 mL Oral TID BM Theodoro Grist, MD   237 mL at 12/31/16 1234  . HYDROcodone-acetaminophen (NORCO) 10-325 MG per tablet 1 tablet  1 tablet Oral Q4H PRN Theodoro Grist, MD   1 tablet at 12/31/16 1234  . loratadine (CLARITIN) tablet 10 mg  10 mg Oral Daily Hillary Bow, MD   10 mg at 12/31/16 0800  . methylPREDNISolone sodium succinate (SOLU-MEDROL) 125 mg/2 mL injection 60 mg  60 mg Intravenous Q6H Theodoro Grist, MD   60 mg at 12/31/16 1234  . morphine 4 MG/ML injection 4 mg  4 mg Intravenous Q4H PRN Lance Coon, MD   4 mg at 12/30/16 2349  . nicotine (NICODERM CQ - dosed in mg/24  hours) patch 21 mg  21 mg Transdermal Daily Theodoro Grist, MD      . ondansetron (ZOFRAN) injection 4 mg  4 mg Intravenous Once Maine, MD      . ondansetron [facility]) tablet 4 mg  4 mg Oral Q6H PRN Hillary Bow, MD       Or  . ondansetron (ZOFRAN) injection 4 mg  4 mg Intravenous Q6H PRN Hillary Bow, MD   4 mg at 12/30/16 1822  . polyethylene glycol (MIRALAX / GLYCOLAX) packet 17 g  17 g Oral Daily PRN Srikar Sudini, MD      . sodium chloride flush (NS) 0.9 % injection 3 mL  3 mL Intravenous Q12H Srikar Sudini, MD   3 mL at 12/31/16 0801  . tiotropium (SPIRIVA) inhalation capsule 18 mcg  18 mcg Inhalation Daily Hillary Bow, MD   18 mcg at 12/31/16 0801      Allergies: No Known Allergies    Past Medical History: Past Medical History:  Diagnosis Date  . Congestive heart failure (CHF) ([place]) 06/27/15  . COPD (chronic obstructive pulmonary disease) ([place]) 06/27/15  . Hyperbilirubinemia   . 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 Sickle cell anemia ([facility])      Past Surgical History: Past Surgical History:  Procedure Laterality Date  . CESAREAN SECTION  1989  . CHOLECYSTECTOMY  1996  . EUS N/A 07/13/2015   Procedure: LOWER ENDOSCOPIC ULTRASOUND (EUS);  Surgeon: Cora Daniels, MD;  Location: [facility] ENDOSCOPY;  Service: Endoscopy;  Laterality: N/A;     Family History: Family History  Problem Relation Age of Onset  . Renal Disease Mother   . Diabetes Mother   . Hypertension Mother   . Emphysema Father      Social History: Social History   Social History  . Marital status: Married    Spouse name: N/A  . Number of children: N/A  . Years of education: N/A   Occupational History  . Not on file.   Social History Main Topics  . Smoking status: Current Every Day Smoker    Packs/day: 0.25    Years: 20.00    Types: Cigarettes  . Smokeless tobacco: Never Used  . Alcohol use 0.0 oz/week     Comment: occasionally  . Drug use: No  . Sexual activity: Not on file    Other Topics Concern  . Not on file   Social History Narrative   ** Merged History Encounter **  Review of Systems: Gen: No fevers or chills HEENT: Jaundice noted CV: Valvular heart disease, cardiomyopathy Resp: Some wheezing, current smoker GI: Decreased appetite GU : No complaints MS: Severe left leg pain Derm:  No complaints Psych: No complaints Heme: Sickle cell disease followed in [place] Neuro: No complaints Endocrine. No complaints  Vital Signs: Blood pressure 99/60, pulse 75, temperature 97.4 F (36.3 C), temperature source Oral, resp. rate 18, height 5' (1.524 m), weight 68.4 kg (150 lb 12.7 oz), last menstrual period 12/30/2016, SpO2 90 %.   Intake/Output Summary (Last 24 hours) at 12/31/16 1639 Last data filed at 12/31/16 1300  Gross per 24 hour  Intake            822.5 ml  Output              150 ml  Net            672.5 ml    Weight trends: Filed Weights   12/30/16 1333 12/30/16 1739 12/31/16 0353  Weight: 73 kg (160 lb 15 oz) 69.7 kg (153 lb 11.2 oz) 68.4 kg (150 lb 12.7 oz)    Physical Exam: General:  No acute distress, laying in the bed some discomfort from pain   HEENT Jaundice noted, moist oral mucous membranes   Neck:  Supple   Lungs: Some mild diffuse wheezing, normal breathing effort   Heart::  Regular rhythm, soft systolic murmur   Abdomen: Soft, nontender, nondistended   Extremities:  1+ pitting edema bilaterally   Neurologic: Alert, oriented   Skin: No acute rashes              Lab results: Basic Metabolic Panel:  Recent Labs Lab 12/30/16 1311 12/30/16 1754 12/31/16 0021 12/31/16 0557 12/31/16 1430  NA 118* 123* 124* 123*  --   K 3.8  --   --  4.5  --   CL 88*  --   --  92*  --   CO2 18*  --   --  18*  --   GLUCOSE 147*  --   --  98  --   BUN 25*  --   --  28*  --   CREATININE 1.07*  --   --  1.14*  --   CALCIUM 8.8*  --   --  9.2  --   MG  --   --   --   --  1.9    Liver Function Tests:  Recent  Labs Lab 12/31/16 0557  AST 20  ALT 17  ALKPHOS 118  BILITOT 9.1*  PROT 7.5  ALBUMIN 4.0   No results for input(s): LIPASE, AMYLASE in the last 168 hours. No results for input(s): AMMONIA in the last 168 hours.  CBC:  Recent Labs Lab 12/30/16 1311 12/31/16 0557  WBC 10.7 11.4*  NEUTROABS 9.0*  --   HGB 13.2 14.1  HCT 37.1 39.2  MCV 111.9* 111.3*  PLT 190 212    Cardiac Enzymes: No results for input(s): CKTOTAL, TROPONINI in the last 168 hours.  BNP: Invalid input(s): POCBNP  CBG:  Recent Labs Lab 12/30/16 2117 12/31/16 0741 12/31/16 1126  GLUCAP 122* 103* 86    Microbiology: No results found for this or any previous visit (from the past 720 hour(s)).   Coagulation Studies:  Recent Labs  12/31/16 1430  LABPROT 15.9*  INR 1.26    Urinalysis: No results for input(s): COLORURINE, LABSPEC, PHURINE, GLUCOSEU, HGBUR, BILIRUBINUR, KETONESUR, PROTEINUR, UROBILINOGEN, NITRITE, LEUKOCYTESUR in the  last 72 hours.  Invalid input(s): APPERANCEUR      Imaging: US Venous Img Lower Bilateral  Result Date: 12/30/2016 CLINICAL DATA:  Bilateral lower extremity pain, left greater than right since this morning. EXAM: BILATERAL LOWER EXTREMITY VENOUS DOPPLER ULTRASOUND TECHNIQUE: Gray-scale sonography with graded compression, as well as color Doppler and duplex ultrasound were performed to evaluate the lower extremity deep venous systems from the level of the common femoral vein and including the common femoral, femoral, profunda femoral, popliteal and calf veins including the posterior tibial, peroneal and gastrocnemius veins when visible. The superficial great saphenous vein was also interrogated. Spectral Doppler was utilized to evaluate flow at rest and with distal augmentation maneuvers in the common femoral, femoral and popliteal veins. COMPARISON:  None. FINDINGS: RIGHT LOWER EXTREMITY Common Femoral Vein: No evidence of thrombus. Normal compressibility, respiratory  phasicity and response to augmentation. Saphenofemoral Junction: No evidence of thrombus. Normal compressibility and flow on color Doppler imaging. Profunda Femoral Vein: No evidence of thrombus. Normal compressibility and flow on color Doppler imaging. Femoral Vein: No evidence of thrombus. Normal compressibility, respiratory phasicity and response to augmentation. Popliteal Vein: No evidence of thrombus. Normal compressibility, respiratory phasicity and response to augmentation. Calf Veins: No evidence of thrombus. Normal compressibility and flow on color Doppler imaging. Superficial Great Saphenous Vein: No evidence of thrombus. Normal compressibility and flow on color Doppler imaging. Venous Reflux:  None. Other Findings:  None. LEFT LOWER EXTREMITY Common Femoral Vein: No evidence of thrombus. Normal compressibility, respiratory phasicity and response to augmentation. Saphenofemoral Junction: No evidence of thrombus. Normal compressibility and flow on color Doppler imaging. Profunda Femoral Vein: No evidence of thrombus. Normal compressibility and flow on color Doppler imaging. Femoral Vein: No evidence of thrombus. Normal compressibility, respiratory phasicity and response to augmentation. Popliteal Vein: No evidence of thrombus. Normal compressibility, respiratory phasicity and response to augmentation. Calf Veins: No evidence of thrombus. Normal compressibility and flow on color Doppler imaging. Superficial Great Saphenous Vein: No evidence of thrombus. Normal compressibility and flow on color Doppler imaging. Venous Reflux:  None. Other Findings:  None. IMPRESSION: No evidence of deep venous thrombosis. Electronically Signed   By: Abelardo Diesel M.D.   On: 12/30/2016 16:57   Dg Chest Portable 1 View  Result Date: 12/30/2016 CLINICAL DATA:  Patient with left leg pain. Bradycardia. Hypotension. EXAM: PORTABLE CHEST 1 VIEW COMPARISON:  Chest radiograph 06/19/2015. FINDINGS: Monitoring leads overlie the  patient. Stable cardiomegaly. Unchanged linear opacities within the lung bases bilaterally. Small bilateral pleural effusions. Osseous skeleton is unremarkable. IMPRESSION: Cardiomegaly. Unchanged linear opacities lung bases bilaterally favored to represent atelectasis/scarring. Infection not excluded. Small bilateral pleural effusions. Electronically Signed   By: Lovey Newcomer M.D.   On: 12/30/2016 13:49      Assessment & Plan: Pt is a 46 y.o. 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   1. Hyponatremia Baseline sodium appears to be 141 from June 2016 Admission sodium is 118. It has improved slightly from 123-124 today Differential diagnoses includes volume depletion, in which case, sodium should correct with volume replacement. She also has an abdominal mass which is under workup Patient has been consuming clear liquids mostly water due to recent dehydration/diarrhea Plan: Obtain urine studies including urinalysis, urine osmolality, serum osmolality Nutritional supplements Further recommendations as per hospital course

## 2016-12-31 NOTE — Progress Notes (Signed)
Pt given dig immuno fab, HR is 81 per telemetry and pt is currently in a-fib. I will continue to assess.

## 2016-12-31 NOTE — Progress Notes (Signed)
[place] [facility] [place] NAME: Shannon Schneider    MR#:  [id number]  DATE OF BIRTH:  Jun 15, 1971  SUBJECTIVE:  CHIEF COMPLAINT:   Chief Complaint  Patient presents with  . Leg Pain  Patient is a 46 year old African-American female with past medical history significant for sickle cell disease, who presents to the hospital with complaints of left lower extremity pain in the thigh area, concerning for sickle cell crisis. She was noted to be hyponatremic with sodium level of 118, hypotensive, she was rehydrated and his sodium level as well as blood pressure has improved. She was noted to have arrhythmia, bradycardia, concerning for 3 AV block, she was seen by cardiologist, who felt that patient had ST-T changes, but she was in sinus rhythm.. Patient denies any chest pains. LDH is high at 311, magnesium level is normal at 1.9, total bilirubin is high at 9.1, better than yesterday  Review of Systems  Musculoskeletal: Positive for joint pain.    VITAL SIGNS: Blood pressure 99/60, pulse 75, temperature 97.4 F (36.3 C), temperature source Oral, resp. rate 18, height 5' (1.524 m), weight 68.4 kg (150 lb 12.7 oz), last menstrual period 12/30/2016, SpO2 90 %.  PHYSICAL EXAMINATION:   GENERAL:  46 y.o.-year-old patient lying in the bed in mild to moderate distress due to left lower extremity pain.  EYES: Pupils equal, round, reactive to light and accommodation. No scleral icterus. Extraocular muscles intact.  HEENT: Head atraumatic, normocephalic. Oropharynx and nasopharynx clear. JVD is elevated NECK:  Supple, no jugular venous distention. No thyroid enlargement, no tenderness.  LUNGS: Normal breath sounds bilaterally, no wheezing, rales,rhonchi or crepitation. No use of accessory muscles of respiration.  CARDIOVASCULAR: S1, S2 normal. No murmurs, rubs, or gallops.  ABDOMEN: Soft, nontender, nondistended. Bowel sounds present. No organomegaly or mass.   EXTREMITIES: No pedal edema, cyanosis, or clubbing.  NEUROLOGIC: Cranial nerves II through XII are intact. Muscle strength 5/5 in all extremities. Sensation intact. Gait not checked.  PSYCHIATRIC: The patient is alert and oriented x 3.  SKIN: No obvious rash, lesion, or ulcer.   ORDERS/RESULTS REVIEWED:   CBC  Recent Labs Lab 12/30/16 1311 12/31/16 0557  WBC 10.7 11.4*  HGB 13.2 14.1  HCT 37.1 39.2  PLT 190 212  MCV 111.9* 111.3*  MCH 39.9* 40.1*  MCHC 35.7 36.0  RDW 19.9* 19.2*  LYMPHSABS 1.3  --   MONOABS 0.4  --   EOSABS 0.0  --   BASOSABS 0.0  --    ------------------------------------------------------------------------------------------------------------------  Chemistries   Recent Labs Lab 12/30/16 1311 12/30/16 1754 12/31/16 0021 12/31/16 0557 12/31/16 1430  NA 118* 123* 124* 123*  --   K 3.8  --   --  4.5  --   CL 88*  --   --  92*  --   CO2 18*  --   --  18*  --   GLUCOSE 147*  --   --  98  --   BUN 25*  --   --  28*  --   CREATININE 1.07*  --   --  1.14*  --   CALCIUM 8.8*  --   --  9.2  --   MG  --   --   --   --  1.9  AST 27  --   --  20  --   ALT 20  --   --  17  --   ALKPHOS 110  --   --  118  --   BILITOT 9.8*  --   --  9.1*  --    ------------------------------------------------------------------------------------------------------------------ estimated creatinine clearance is 53.2 mL/min (by C-G formula based on SCr of 1.14 mg/dL (H)). ------------------------------------------------------------------------------------------------------------------ No results for input(s): TSH, T4TOTAL, T3FREE, THYROIDAB in the last 72 hours.  Invalid input(s): FREET3  Cardiac Enzymes No results for input(s): CKMB, TROPONINI, MYOGLOBIN in the last 168 hours.  Invalid input(s): CK ------------------------------------------------------------------------------------------------------------------ Invalid input(s):  POCBNP ---------------------------------------------------------------------------------------------------------------  RADIOLOGY: US Venous Img Lower Bilateral  Result Date: 12/30/2016 CLINICAL DATA:  Bilateral lower extremity pain, left greater than right since this morning. EXAM: BILATERAL LOWER EXTREMITY VENOUS DOPPLER ULTRASOUND TECHNIQUE: Gray-scale sonography with graded compression, as well as color Doppler and duplex ultrasound were performed to evaluate the lower extremity deep venous systems from the level of the common femoral vein and including the common femoral, femoral, profunda femoral, popliteal and calf veins including the posterior tibial, peroneal and gastrocnemius veins when visible. The superficial great saphenous vein was also interrogated. Spectral Doppler was utilized to evaluate flow at rest and with distal augmentation maneuvers in the common femoral, femoral and popliteal veins. COMPARISON:  None. FINDINGS: RIGHT LOWER EXTREMITY Common Femoral Vein: No evidence of thrombus. Normal compressibility, respiratory phasicity and response to augmentation. Saphenofemoral Junction: No evidence of thrombus. Normal compressibility and flow on color Doppler imaging. Profunda Femoral Vein: No evidence of thrombus. Normal compressibility and flow on color Doppler imaging. Femoral Vein: No evidence of thrombus. Normal compressibility, respiratory phasicity and response to augmentation. Popliteal Vein: No evidence of thrombus. Normal compressibility, respiratory phasicity and response to augmentation. Calf Veins: No evidence of thrombus. Normal compressibility and flow on color Doppler imaging. Superficial Great Saphenous Vein: No evidence of thrombus. Normal compressibility and flow on color Doppler imaging. Venous Reflux:  None. Other Findings:  None. LEFT LOWER EXTREMITY Common Femoral Vein: No evidence of thrombus. Normal compressibility, respiratory phasicity and response to augmentation.  Saphenofemoral Junction: No evidence of thrombus. Normal compressibility and flow on color Doppler imaging. Profunda Femoral Vein: No evidence of thrombus. Normal compressibility and flow on color Doppler imaging. Femoral Vein: No evidence of thrombus. Normal compressibility, respiratory phasicity and response to augmentation. Popliteal Vein: No evidence of thrombus. Normal compressibility, respiratory phasicity and response to augmentation. Calf Veins: No evidence of thrombus. Normal compressibility and flow on color Doppler imaging. Superficial Great Saphenous Vein: No evidence of thrombus. Normal compressibility and flow on color Doppler imaging. Venous Reflux:  None. Other Findings:  None. IMPRESSION: No evidence of deep venous thrombosis. Electronically Signed   By: Abelardo Diesel M.D.   On: 12/30/2016 16:57   Dg Chest Portable 1 View  Result Date: 12/30/2016 CLINICAL DATA:  Patient with left leg pain. Bradycardia. Hypotension. EXAM: PORTABLE CHEST 1 VIEW COMPARISON:  Chest radiograph 06/19/2015. FINDINGS: Monitoring leads overlie the patient. Stable cardiomegaly. Unchanged linear opacities within the lung bases bilaterally. Small bilateral pleural effusions. Osseous skeleton is unremarkable. IMPRESSION: Cardiomegaly. Unchanged linear opacities lung bases bilaterally favored to represent atelectasis/scarring. Infection not excluded. Small bilateral pleural effusions. Electronically Signed   By: Lovey Newcomer M.D.   On: 12/30/2016 13:49    EKG:  Orders placed or performed during the hospital encounter of 12/30/16  . EKG 12-Lead  . EKG 12-Lead  . EKG 12-Lead  . EKG 12-Lead  . EKG 12-Lead  . EKG 12-Lead    ASSESSMENT AND PLAN:  Active Problems:   Hyponatremia  #1. Hyponatremia due to nausea, vomiting and diarrhea, improved, follow in the morning.  Stop Celexa for now, recheck sodium level in the morning #2. Hypotension, resolved with IV fluid administration, discontinue IV fluids due to fluid  overload, follow closely #3. Hyperbilirubinemia of unclear etiology, ?  liver disease, less likely due to sickle cell disease, direct bilirubin is pending , discussed with oncologist/hematologist #4. Arrhythmia, third-degree AV block, due to digoxin, per cardiology, discontinue digoxin, appreciate cardiology's input, patient is receiving Digifab, follow clinically #5. Acute on chronic systolic CHF, no diuretics at this point due to hypotension, digifab for digoxin toxicity.  Management plans discussed with the patient, family and they are in agreement.   DRUG ALLERGIES: No Known Allergies  CODE STATUS:     Code Status Orders        Start     Ordered   12/30/16 1613  Full code  Continuous     12/30/16 1615    Code Status History    Date Active Date Inactive Code Status Order ID Comments User Context   06/27/2015  1:31 PM 06/29/2015  5:59 PM Full Code [id number]  Henreitta Leber, MD Inpatient      TOTAL Critical care TIME TAKING CARE OF THIS PATIENT: 40 minutes.    Theodoro Grist M.D on 12/31/2016 at 3:10 PM  Between 7am to 6pm - Pager - [phone]  After 6pm go to [email or web address] - password [email or web address] [facility]  [facility]  Office  [phone]  CC: Primary care physician; Kasandra Knudsen, NP

## 2016-12-31 NOTE — Progress Notes (Signed)
Shannon Schneider is a 46 y.o. female  [id number]  Primary Cardiologist: Neoma Laming Reason for Consultation: Abnormal EKG  HPI: [id number] presented to hospital with left leg pain and EKG had changes and I was asked to evaluate. No chest pain.  Review of Systems: No shortness of breath/orthopnea/PND   Past Medical History:  Diagnosis Date  . Congestive heart failure (CHF) ([place]) 06/27/15  . COPD (chronic obstructive pulmonary disease) ([place]) 06/27/15  . Hyperbilirubinemia   . 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 Sickle cell anemia (HCC)     Medications Prior to Admission  Medication Sig Dispense Refill  . albuterol (PROVENTIL HFA;VENTOLIN HFA) 108 (90 Base) MCG/ACT inhaler Inhale 2 puffs into the lungs every 6 (six) hours as needed for wheezing.    . carvedilol (COREG) 3.125 MG tablet Take 3.125 mg by mouth 2 (two) times daily with a meal.     . citalopram (CELEXA) 10 MG tablet Take 10 mg by mouth at bedtime.    . digoxin (LANOXIN) 0.25 MG tablet Take 250 mcg by mouth daily.     . ergocalciferol (VITAMIN D2) 50000 units capsule Take 50,000 Units by mouth once a week.    . furosemide (LASIX) 40 MG tablet Take 40 mg by mouth.    . loratadine (CLARITIN) 10 MG tablet Take 10 mg by mouth daily.    . sacubitril-valsartan (ENTRESTO) 24-26 MG Take 1 tablet by mouth 2 (two) times daily.    . Tiotropium Bromide Monohydrate (SPIRIVA RESPIMAT) 2.5 MCG/ACT AERS Inhale 1 puff into the lungs 2 (two) times daily.    . OXYGEN Inhale 2 L into the lungs continuous.        Marland Kitchen albuterol  2.5 mg Nebulization Q4H  . budesonide (PULMICORT) nebulizer solution  0.25 mg Nebulization BID  . citalopram  10 mg Oral QHS  . enoxaparin (LOVENOX) injection  40 mg Subcutaneous Q24H  . feeding supplement (ENSURE ENLIVE)  237 mL Oral TID BM  . loratadine  10 mg Oral Daily  . methylPREDNISolone (SOLU-MEDROL) injection  60 mg Intravenous Q6H  . nicotine  21 mg Transdermal Daily  . ondansetron (ZOFRAN) IV  4 mg Intravenous Once   . sodium chloride  1,000 mL Intravenous Once  . sodium chloride flush  3 mL Intravenous Q12H  . tiotropium  18 mcg Inhalation Daily    Infusions:   No Known Allergies  Social History   Social History  . Marital status: Married    Spouse name: N/A  . Number of children: N/A  . Years of education: N/A   Occupational History  . Not on file.   Social History Main Topics  . Smoking status: Current Every Day Smoker    Packs/day: 0.25    Years: 20.00    Types: Cigarettes  . Smokeless tobacco: Never Used  . Alcohol use 0.0 oz/week     Comment: occasionally  . Drug use: No  . Sexual activity: Not on file   Other Topics Concern  . Not on file   Social History Narrative   ** Merged History Encounter **        Family History  Problem Relation Age of Onset  . Renal Disease Mother   . Diabetes Mother   . Hypertension Mother   . Emphysema Father     PHYSICAL EXAM: Vitals:   12/31/16 0908 12/31/16 1130  BP: 99/64 99/60  Pulse: (!) 49 75  Resp:  18  Temp:  97.4 F (36.3 C)  Intake/Output Summary (Last 24 hours) at 12/31/16 1232 Last data filed at 12/31/16 0909  Gross per 24 hour  Intake            702.5 ml  Output              150 ml  Net            552.5 ml    General:  Well appearing. No respiratory difficulty HEENT: normal Neck: supple. no JVD. Carotids 2+ bilat; no bruits. No lymphadenopathy or thryomegaly appreciated. Cor: PMI nondisplaced. Regular rate & rhythm. No rubs, gallops or murmurs. Lungs: clear Abdomen: soft, nontender, nondistended. No hepatosplenomegaly. No bruits or masses. Good bowel sounds. Extremities: no cyanosis, clubbing, rash, edema Neuro: alert & oriented x 3, cranial nerves grossly intact. moves all 4 extremities w/o difficulty. Affect pleasant.  ECG:NSR withst and t changes which are worse than prior EKG as far back as 2016, but probably due to digoxin level being high.  Results for orders placed or performed during the  hospital encounter of 12/30/16 (from the past 24 hour(s))  CBC with Differential/Platelet     Status: Abnormal   Collection Time: 12/30/16  1:11 PM  Result Value Ref Range   WBC 10.7 3.6 - 11.0 K/uL   RBC 3.32 (L) 3.80 - 5.20 MIL/uL   Hemoglobin 13.2 12.0 - 16.0 g/dL   HCT 37.1 35.0 - 47.0 %   MCV 111.9 (H) 80.0 - 100.0 fL   MCH 39.9 (H) 26.0 - 34.0 pg   MCHC 35.7 32.0 - 36.0 g/dL   RDW 19.9 (H) 11.5 - 14.5 %   Platelets 190 150 - 440 K/uL   Neutrophils Relative % 84 %   Lymphocytes Relative 12 %   Monocytes Relative 4 %   Eosinophils Relative 0 %   Basophils Relative 0 %   Band Neutrophils 0 %   Metamyelocytes Relative 0 %   Myelocytes 0 %   Promyelocytes Absolute 0 %   Blasts 0 %   nRBC 14 (H) 0 /100 WBC   Other 0 %   Neutro Abs 9.0 (H) 1.4 - 6.5 K/uL   Lymphs Abs 1.3 1.0 - 3.6 K/uL   Monocytes Absolute 0.4 0.2 - 0.9 K/uL   Eosinophils Absolute 0.0 0 - 0.7 K/uL   Basophils Absolute 0.0 0 - 0.1 K/uL   RBC Morphology POLYCHROMASIA PRESENT    Smear Review LARGE PLATELETS PRESENT   Reticulocytes     Status: Abnormal   Collection Time: 12/30/16  1:11 PM  Result Value Ref Range   Retic Ct Pct 11.1 (H) 0.4 - 3.1 %   RBC. 3.32 (L) 3.80 - 5.20 MIL/uL   Retic Count, Manual 368.5 (H) 19.0 - 183.0 K/uL  Basic metabolic panel     Status: Abnormal   Collection Time: 12/30/16  1:11 PM  Result Value Ref Range   Sodium 118 (LL) 135 - 145 mmol/L   Potassium 3.8 3.5 - 5.1 mmol/L   Chloride 88 (L) 101 - 111 mmol/L   CO2 18 (L) 22 - 32 mmol/L   Glucose, Bld 147 (H) 65 - 99 mg/dL   BUN 25 (H) 6 - 20 mg/dL   Creatinine, Ser 1.07 (H) 0.44 - 1.00 mg/dL   Calcium 8.8 (L) 8.9 - 10.3 mg/dL   GFR calc non Af Amer >60 >60 mL/min   GFR calc Af Amer >60 >60 mL/min   Anion gap 12 5 - 15  Hepatic function panel  Status: Abnormal   Collection Time: 12/30/16  1:11 PM  Result Value Ref Range   Total Protein 6.8 6.5 - 8.1 g/dL   Albumin 3.7 3.5 - 5.0 g/dL   AST 27 15 - 41 U/L   ALT 20 14 -  54 U/L   Alkaline Phosphatase 110 38 - 126 U/L   Total Bilirubin 9.8 (H) 0.3 - 1.2 mg/dL   Bilirubin, Direct 4.6 (H) 0.1 - 0.5 mg/dL   Indirect Bilirubin 5.2 (H) 0.3 - 0.9 mg/dL  Digoxin level     Status: Abnormal   Collection Time: 12/30/16  1:11 PM  Result Value Ref Range   Digoxin Level 2.1 (H) 0.8 - 2.0 ng/mL  Pathologist smear review     Status: None   Collection Time: 12/30/16  1:11 PM  Result Value Ref Range   Path Review      Peripheral smear reveals macrocytosis and circulating nucleated RBCs and occasional sickle cells.  Bilirubin, direct     Status: Abnormal   Collection Time: 12/30/16  1:11 PM  Result Value Ref Range   Bilirubin, Direct 4.6 (H) 0.1 - 0.5 mg/dL  Sodium     Status: Abnormal   Collection Time: 12/30/16  5:54 PM  Result Value Ref Range   Sodium 123 (L) 135 - 145 mmol/L  Glucose, capillary     Status: Abnormal   Collection Time: 12/30/16  9:17 PM  Result Value Ref Range   Glucose-Capillary 122 (H) 65 - 99 mg/dL  Influenza panel by PCR (type A & B, H1N1)     Status: None   Collection Time: 12/30/16 10:30 PM  Result Value Ref Range   Influenza A By PCR NEGATIVE NEGATIVE   Influenza B By PCR NEGATIVE NEGATIVE  Sodium     Status: Abnormal   Collection Time: 12/31/16 12:21 AM  Result Value Ref Range   Sodium 124 (L) 135 - 145 mmol/L  CBC     Status: Abnormal   Collection Time: 12/31/16  5:57 AM  Result Value Ref Range   WBC 11.4 (H) 3.6 - 11.0 K/uL   RBC 3.52 (L) 3.80 - 5.20 MIL/uL   Hemoglobin 14.1 12.0 - 16.0 g/dL   HCT 39.2 35.0 - 47.0 %   MCV 111.3 (H) 80.0 - 100.0 fL   MCH 40.1 (H) 26.0 - 34.0 pg   MCHC 36.0 32.0 - 36.0 g/dL   RDW 19.2 (H) 11.5 - 14.5 %   Platelets 212 150 - 440 K/uL  Comprehensive metabolic panel     Status: Abnormal   Collection Time: 12/31/16  5:57 AM  Result Value Ref Range   Sodium 123 (L) 135 - 145 mmol/L   Potassium 4.5 3.5 - 5.1 mmol/L   Chloride 92 (L) 101 - 111 mmol/L   CO2 18 (L) 22 - 32 mmol/L   Glucose, Bld 98  65 - 99 mg/dL   BUN 28 (H) 6 - 20 mg/dL   Creatinine, Ser 1.14 (H) 0.44 - 1.00 mg/dL   Calcium 9.2 8.9 - 10.3 mg/dL   Total Protein 7.5 6.5 - 8.1 g/dL   Albumin 4.0 3.5 - 5.0 g/dL   AST 20 15 - 41 U/L   ALT 17 14 - 54 U/L   Alkaline Phosphatase 118 38 - 126 U/L   Total Bilirubin 9.1 (H) 0.3 - 1.2 mg/dL   GFR calc non Af Amer 57 (L) >60 mL/min   GFR calc Af Amer >60 >60 mL/min   Anion gap 13  5 - 15  Glucose, capillary     Status: Abnormal   Collection Time: 12/31/16  7:41 AM  Result Value Ref Range   Glucose-Capillary 103 (H) 65 - 99 mg/dL  Glucose, capillary     Status: None   Collection Time: 12/31/16 11:26 AM  Result Value Ref Range   Glucose-Capillary 86 65 - 99 mg/dL   US Venous Img Lower Bilateral  Result Date: 12/30/2016 CLINICAL DATA:  Bilateral lower extremity pain, left greater than right since this morning. EXAM: BILATERAL LOWER EXTREMITY VENOUS DOPPLER ULTRASOUND TECHNIQUE: Gray-scale sonography with graded compression, as well as color Doppler and duplex ultrasound were performed to evaluate the lower extremity deep venous systems from the level of the common femoral vein and including the common femoral, femoral, profunda femoral, popliteal and calf veins including the posterior tibial, peroneal and gastrocnemius veins when visible. The superficial great saphenous vein was also interrogated. Spectral Doppler was utilized to evaluate flow at rest and with distal augmentation maneuvers in the common femoral, femoral and popliteal veins. COMPARISON:  None. FINDINGS: RIGHT LOWER EXTREMITY Common Femoral Vein: No evidence of thrombus. Normal compressibility, respiratory phasicity and response to augmentation. Saphenofemoral Junction: No evidence of thrombus. Normal compressibility and flow on color Doppler imaging. Profunda Femoral Vein: No evidence of thrombus. Normal compressibility and flow on color Doppler imaging. Femoral Vein: No evidence of thrombus. Normal compressibility,  respiratory phasicity and response to augmentation. Popliteal Vein: No evidence of thrombus. Normal compressibility, respiratory phasicity and response to augmentation. Calf Veins: No evidence of thrombus. Normal compressibility and flow on color Doppler imaging. Superficial Great Saphenous Vein: No evidence of thrombus. Normal compressibility and flow on color Doppler imaging. Venous Reflux:  None. Other Findings:  None. LEFT LOWER EXTREMITY Common Femoral Vein: No evidence of thrombus. Normal compressibility, respiratory phasicity and response to augmentation. Saphenofemoral Junction: No evidence of thrombus. Normal compressibility and flow on color Doppler imaging. Profunda Femoral Vein: No evidence of thrombus. Normal compressibility and flow on color Doppler imaging. Femoral Vein: No evidence of thrombus. Normal compressibility, respiratory phasicity and response to augmentation. Popliteal Vein: No evidence of thrombus. Normal compressibility, respiratory phasicity and response to augmentation. Calf Veins: No evidence of thrombus. Normal compressibility and flow on color Doppler imaging. Superficial Great Saphenous Vein: No evidence of thrombus. Normal compressibility and flow on color Doppler imaging. Venous Reflux:  None. Other Findings:  None. IMPRESSION: No evidence of deep venous thrombosis. Electronically Signed   By: Abelardo Diesel M.D.   On: 12/30/2016 16:57   Dg Chest Portable 1 View  Result Date: 12/30/2016 CLINICAL DATA:  Patient with left leg pain. Bradycardia. Hypotension. EXAM: PORTABLE CHEST 1 VIEW COMPARISON:  Chest radiograph 06/19/2015. FINDINGS: Monitoring leads overlie the patient. Stable cardiomegaly. Unchanged linear opacities within the lung bases bilaterally. Small bilateral pleural effusions. Osseous skeleton is unremarkable. IMPRESSION: Cardiomegaly. Unchanged linear opacities lung bases bilaterally favored to represent atelectasis/scarring. Infection not excluded. Small bilateral  pleural effusions. Electronically Signed   By: Lovey Newcomer M.D.   On: 12/30/2016 13:49     ASSESSMENT AND PLAN: Abnormal EKG with st and t changes in precordial leads different from 2016 EKG but no chest pains. Digoxin level is high, consider digibind, as causing the EKG changes. KHAN,SHAUKAT A

## 2017-01-01 ENCOUNTER — Inpatient Hospital Stay: Payer: BLUE CROSS/BLUE SHIELD

## 2017-01-01 LAB — COMPREHENSIVE METABOLIC PANEL
ALBUMIN: 3.8 g/dL (ref 3.5–5.0)
ALT: 16 U/L (ref 14–54)
AST: 19 U/L (ref 15–41)
Alkaline Phosphatase: 103 U/L (ref 38–126)
Anion gap: 7 (ref 5–15)
BUN: 25 mg/dL — ABNORMAL HIGH (ref 6–20)
CHLORIDE: 99 mmol/L — AB (ref 101–111)
CO2: 22 mmol/L (ref 22–32)
Calcium: 9.4 mg/dL (ref 8.9–10.3)
Creatinine, Ser: 0.66 mg/dL (ref 0.44–1.00)
GFR calc Af Amer: >60 mL/min (ref >60)
GFR calc non Af Amer: >60 mL/min (ref >60)
GLUCOSE: 142 mg/dL — AB (ref 65–99)
POTASSIUM: 4.7 mmol/L (ref 3.5–5.1)
Sodium: 128 mmol/L — ABNORMAL LOW (ref 135–145)
Total Bilirubin: 7.4 mg/dL — ABNORMAL HIGH (ref 0.3–1.2)
Total Protein: 7.2 g/dL (ref 6.5–8.1)

## 2017-01-01 LAB — CBC
HEMATOCRIT: 36 % (ref 35.0–47.0)
Hemoglobin: 12.9 g/dL (ref 12.0–16.0)
MCH: 39.9 pg — ABNORMAL HIGH (ref 26.0–34.0)
MCHC: 35.9 g/dL (ref 32.0–36.0)
MCV: 111.2 fL — AB (ref 80.0–100.0)
Platelets: 215 10*3/uL (ref 150–440)
RBC: 3.24 MIL/uL — ABNORMAL LOW (ref 3.80–5.20)
RDW: 19.5 % — AB (ref 11.5–14.5)
WBC: 11.4 10*3/uL — ABNORMAL HIGH (ref 3.6–11.0)

## 2017-01-01 LAB — ECHOCARDIOGRAM COMPLETE
HEIGHTINCHES: 60 in
WEIGHTICAEL: 2412.71 [oz_av]

## 2017-01-01 LAB — OSMOLALITY, URINE: Osmolality, Ur: 149 mOsm/kg — ABNORMAL LOW (ref 300–900)

## 2017-01-01 LAB — HAPTOGLOBIN

## 2017-01-01 MED ORDER — FUROSEMIDE 20 MG PO TABS
20.0000 mg | ORAL_TABLET | Freq: Once | ORAL | Status: AC
  Administered 2017-01-01: 20 mg via ORAL
  Filled 2017-01-01: qty 1

## 2017-01-01 MED ORDER — FAMOTIDINE 20 MG PO TABS
20.0000 mg | ORAL_TABLET | Freq: Two times a day (BID) | ORAL | Status: DC
  Administered 2017-01-01 – 2017-01-02 (×3): 20 mg via ORAL
  Filled 2017-01-01 (×3): qty 1

## 2017-01-01 MED ORDER — ALBUTEROL SULFATE (2.5 MG/3ML) 0.083% IN NEBU: 2.5000 mg | INHALATION_SOLUTION | RESPIRATORY_TRACT | Status: DC | PRN

## 2017-01-01 NOTE — Progress Notes (Signed)
S: Doing fair today States that pain control is better Serum creatinine is back to baseline of 0.66 Potassium is normal Sodium has improved to 128 today TSH and cortisol are normal Urine sodium was less than 10  Medications:  Current medications: Current Facility-Administered Medications  Medication Dose Route Frequency Provider Last Rate Last Dose  . acetaminophen (TYLENOL) tablet 650 mg  650 mg Oral Q6H PRN Hillary Bow, MD   650 mg at 12/30/16 2202   Or  . acetaminophen (TYLENOL) suppository 650 mg  650 mg Rectal Q6H PRN Srikar Sudini, MD      . albuterol (PROVENTIL) (2.5 MG/3ML) 0.083% nebulizer solution 2.5 mg  2.5 mg Nebulization Q4H PRN Theodoro Grist, MD      . budesonide (PULMICORT) nebulizer solution 0.25 mg  0.25 mg Nebulization BID Theodoro Grist, MD   0.25 mg at 12/31/16 2158  . enoxaparin (LOVENOX) injection 40 mg  40 mg Subcutaneous Q24H Srikar Sudini, MD   40 mg at 12/31/16 2055  . famotidine (PEPCID) tablet 20 mg  20 mg Oral BID Murlean Iba, MD   20 mg at 01/01/17 1524  . feeding supplement (ENSURE ENLIVE) (ENSURE ENLIVE) liquid 237 mL  237 mL Oral TID BM Theodoro Grist, MD   237 mL at 01/01/17 1524  . HYDROcodone-acetaminophen (NORCO) 10-325 MG per tablet 1 tablet  1 tablet Oral Q4H PRN Theodoro Grist, MD   1 tablet at 01/01/17 1319  . loratadine (CLARITIN) tablet 10 mg  10 mg Oral Daily Hillary Bow, MD   10 mg at 01/01/17 0925  . methylPREDNISolone sodium succinate (SOLU-MEDROL) 125 mg/2 mL injection 60 mg  60 mg Intravenous Q6H Theodoro Grist, MD   60 mg at 01/01/17 1524  . morphine 4 MG/ML injection 4 mg  4 mg Intravenous Q4H PRN Lance Coon, MD   4 mg at 12/30/16 2349  . nicotine (NICODERM CQ - dosed in mg/24 hours) patch 21 mg  21 mg Transdermal Daily Theodoro Grist, MD      . ondansetron (ZOFRAN) injection 4 mg  4 mg Intravenous Once Maine, MD      . ondansetron [facility]) tablet 4 mg  4 mg Oral Q6H PRN Hillary Bow, MD       Or  . ondansetron  (ZOFRAN) injection 4 mg  4 mg Intravenous Q6H PRN Hillary Bow, MD   4 mg at 12/30/16 1822  . polyethylene glycol (MIRALAX / GLYCOLAX) packet 17 g  17 g Oral Daily PRN Srikar Sudini, MD      . sodium chloride flush (NS) 0.9 % injection 3 mL  3 mL Intravenous Q12H Hillary Bow, MD   3 mL at 01/01/17 0927  . tiotropium (SPIRIVA) inhalation capsule 18 mcg  18 mcg Inhalation Daily Hillary Bow, MD   18 mcg at 01/01/17 [id number]      Allergies: No Known Allergies    Past Medical History: Past Medical History:  Diagnosis Date  . Congestive heart failure (CHF) ([place]) 06/27/15  . COPD (chronic obstructive pulmonary disease) ([place]) 06/27/15  . Hyperbilirubinemia   . 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 Sickle cell anemia (HCC)      Past Surgical History: Past Surgical History:  Procedure Laterality Date  . CESAREAN SECTION  1989  . CHOLECYSTECTOMY  1996  . EUS N/A 07/13/2015   Procedure: LOWER ENDOSCOPIC ULTRASOUND (EUS);  Surgeon: Cora Daniels, MD;  Location: [facility];  Service: Endoscopy;  Laterality: N/A;     Family History: Family History  Problem  Relation Age of Onset  . Renal Disease Mother   . Diabetes Mother   . Hypertension Mother   . Emphysema Father      Social History: Social History   Social History  . Marital status: Married    Spouse name: N/A  . Number of children: N/A  . Years of education: N/A   Occupational History  . Not on file.   Social History Main Topics  . Smoking status: Current Every Day Smoker    Packs/day: 0.25    Years: 20.00    Types: Cigarettes  . Smokeless tobacco: Never Used  . Alcohol use 0.0 oz/week     Comment: occasionally  . Drug use: No  . Sexual activity: Not on file   Other Topics Concern  . Not on file   Social History Narrative   ** Merged History Encounter **          Vital Signs: Blood pressure 101/60, pulse 72, temperature 97.6 F (36.4 C), temperature source Oral, resp. rate 18, height 5' (1.524 m),  weight 70.3 kg (154 lb 14.4 oz), last menstrual period 12/30/2016, SpO2 94 %.   Intake/Output Summary (Last 24 hours) at 01/01/17 1724 Last data filed at 01/01/17 0446  Gross per 24 hour  Intake              126 ml  Output             1400 ml  Net            -1274 ml    Weight trends: Filed Weights   12/30/16 1739 12/31/16 0353 01/01/17 0441  Weight: 69.7 kg (153 lb 11.2 oz) 68.4 kg (150 lb 12.7 oz) 70.3 kg (154 lb 14.4 oz)    Physical Exam: General:  No acute distress, laying in the bed some discomfort from pain   HEENT Jaundice noted, moist oral mucous membranes   Neck:  Supple   Lungs: Some mild diffuse wheezing, normal breathing effort   Heart::  Regular rhythm, soft systolic murmur   Abdomen: Soft, nontender, nondistended   Extremities:  1+ pitting edema bilaterally   Neurologic: Alert, oriented   Skin: No acute rashes              Lab results: Basic Metabolic Panel:  Recent Labs Lab 12/30/16 1311  12/31/16 0021 12/31/16 0557 12/31/16 1430 01/01/17 0554  NA 118*  < > 124* 123*  --  128*  K 3.8  --   --  4.5  --  4.7  CL 88*  --   --  92*  --  99*  CO2 18*  --   --  18*  --  22  GLUCOSE 147*  --   --  98  --  142*  BUN 25*  --   --  28*  --  25*  CREATININE 1.07*  --   --  1.14*  --  0.66  CALCIUM 8.8*  --   --  9.2  --  9.4  MG  --   --   --   --  1.9  --   < > = values in this interval not displayed.  Liver Function Tests:  Recent Labs Lab 01/01/17 0554  AST 19  ALT 16  ALKPHOS 103  BILITOT 7.4*  PROT 7.2  ALBUMIN 3.8   No results for input(s): LIPASE, AMYLASE in the last 168 hours. No results for input(s): AMMONIA in the last 168 hours.  CBC:  Recent Labs Lab 12/30/16 1311 12/31/16 0557 01/01/17 0554  WBC 10.7 11.4* 11.4*  NEUTROABS 9.0*  --   --   HGB 13.2 14.1 12.9  HCT 37.1 39.2 36.0  MCV 111.9* 111.3* 111.2*  PLT 190 212 215    Cardiac Enzymes: No results for input(s): CKTOTAL, TROPONINI in the last 168  hours.  BNP: Invalid input(s): POCBNP  CBG:  Recent Labs Lab 12/30/16 2117 12/31/16 0741 12/31/16 1126  GLUCAP 122* 103* 86    Microbiology: No results found for this or any previous visit (from the past 720 hour(s)).   Coagulation Studies:  Recent Labs  12/31/16 1430  LABPROT 15.9*  INR 1.26    Urinalysis: No results for input(s): COLORURINE, LABSPEC, PHURINE, GLUCOSEU, HGBUR, BILIRUBINUR, KETONESUR, PROTEINUR, UROBILINOGEN, NITRITE, LEUKOCYTESUR in the last 72 hours.  Invalid input(s): APPERANCEUR      Imaging: Dg Chest Port 1 View  Result Date: 01/01/2017 CLINICAL DATA:  Hypoxia EXAM: PORTABLE CHEST 1 VIEW COMPARISON:  12/30/2016 FINDINGS: Prominent cardiac silhouette, unchanged. This could represent pericardial effusion or cardiomegaly or both. Decreased interstitial thickening in the basilar periphery may reflect a diminished degree of interstitial fluid/ edema. No confluent consolidation. IMPRESSION: Unchanged enlargement of the cardiac silhouette. Decreased interstitial thickening/ fluid. Electronically Signed   By: Andreas Newport M.D.   On: 01/01/2017 05:50     Assessment & Plan: Pt is a 46 y.o. 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   1. Hyponatremia Baseline sodium appears to be 141 from June 2016 Admission sodium is 118. It has improved slightly from 128 today Patient has been consuming clear liquids mostly water due to recent dehydration/diarrhea  Plan: S Creatinine is improving with increased oral intake of normal food Continue pain management Supportive care

## 2017-01-01 NOTE — Care Management (Signed)
Independent in all adls but has to take her time, denies issues accessing medical care, obtaining medications or with transportation.  Current with her PCP.  Chronic 02 through [facility].  No home health services presently. No issues paying for Entresto.  At present have not identified any discharge needs.

## 2017-01-01 NOTE — Progress Notes (Signed)
SUBJECTIVE: Patient is feeling better much better   Vitals:   12/31/16 2203 01/01/17 0031 01/01/17 0437 01/01/17 0441  BP:    101/60  Pulse:    72  Resp:    18  Temp:    97.6 F (36.4 C)  TempSrc:    Oral  SpO2: 94% 92% 94% 94%  Weight:    154 lb 14.4 oz (70.3 kg)  Height:        Intake/Output Summary (Last 24 hours) at 01/01/17 1533 Last data filed at 01/01/17 0446  Gross per 24 hour  Intake              126 ml  Output             1400 ml  Net            -1274 ml    LABS: Basic Metabolic Panel:  Recent Labs  12/31/16 0557 12/31/16 1430 01/01/17 0554  NA 123*  --  128*  K 4.5  --  4.7  CL 92*  --  99*  CO2 18*  --  22  GLUCOSE 98  --  142*  BUN 28*  --  25*  CREATININE 1.14*  --  0.66  CALCIUM 9.2  --  9.4  MG  --  1.9  --    Liver Function Tests:  Recent Labs  12/31/16 0557 01/01/17 0554  AST 20 19  ALT 17 16  ALKPHOS 118 103  BILITOT 9.1* 7.4*  PROT 7.5 7.2  ALBUMIN 4.0 3.8   No results for input(s): LIPASE, AMYLASE in the last 72 hours. CBC:  Recent Labs  12/30/16 1311 12/31/16 0557 01/01/17 0554  WBC 10.7 11.4* 11.4*  NEUTROABS 9.0*  --   --   HGB 13.2 14.1 12.9  HCT 37.1 39.2 36.0  MCV 111.9* 111.3* 111.2*  PLT 190 212 215   Cardiac Enzymes: No results for input(s): CKTOTAL, CKMB, CKMBINDEX, TROPONINI in the last 72 hours. BNP: Invalid input(s): POCBNP D-Dimer: No results for input(s): DDIMER in the last 72 hours. Hemoglobin A1C: No results for input(s): HGBA1C in the last 72 hours. Fasting Lipid Panel: No results for input(s): CHOL, HDL, LDLCALC, TRIG, CHOLHDL, LDLDIRECT in the last 72 hours. Thyroid Function Tests:  Recent Labs  12/31/16 1430  TSH 1.066   Anemia Panel:  Recent Labs  12/30/16 1311  RETICCTPCT 11.1*     PHYSICAL EXAM General: Well developed, well nourished, in no acute distress HEENT:  Normocephalic and atramatic Neck:  No JVD.  Lungs: Clear bilaterally to auscultation and percussion. Heart:  HRRR . Normal S1 and S2 without gallops or murmurs.  Abdomen: Bowel sounds are positive, abdomen soft and non-tender  Msk:  Back normal, normal gait. Normal strength and tone for age. Extremities: No clubbing, cyanosis or edema.   Neuro: Alert and oriented X 3. Psych:  Good affect, responds appropriately  TELEMETRY:Sinus rhythm  ASSESSMENT AND PLAN: Congestive heart failure with history of hyponatremia and left leg pain which showed no evidence of DVT. Echocardiogram shows severe pulmonary hypertension with a D-shaped septum and pulmonary systolic pressure over 90. Patient is currently feeling much better may consider digoxin and oxygen to help her with pulmonary hypertension is probably due to COPD.  Active Problems:   Hyponatremia    Shannon David, MD, [facility] 01/01/2017 3:33 PM

## 2017-01-01 NOTE — Progress Notes (Signed)
[facility] [facility] NAME: Shannon Schneider    MR#:  [id number]  DATE OF BIRTH:  May 17, 1971  SUBJECTIVE:  CHIEF COMPLAINT:   Chief Complaint  Patient presents with  . Leg Pain  Patient is a 46 year old African-American female with past medical history significant for sickle cell disease, who presents to the hospital with complaints of left lower extremity pain in the thigh area, concerning for sickle cell crisis. She was noted to be hyponatremic with sodium level of 118, hypotensive, she was rehydrated and his sodium level as well as blood pressure has improved. She was noted to have arrhythmia, bradycardia, concerning for 3 AV block, she was seen by cardiologist, who felt that patient had ST-T changes, but she was in sinus rhythm.. Patient denies any chest pains. LDH was high at 311, magnesium level is normal at 1.9, total bilirubin was high at 7.4, better than yesterday. Patient feels better today, no arrhythmias, in sinus rhythm, sodium level has improved from 123-128. Patient continues to have significant peripheral swelling, remains somewhat hypotensive, unable to use diuretics  Review of Systems  Constitutional: Negative for chills, fever and weight loss.  HENT: Negative for congestion.   Eyes: Negative for blurred vision and double vision.  Respiratory: Positive for shortness of breath. Negative for cough, sputum production and wheezing.   Cardiovascular: Positive for leg swelling. Negative for chest pain, palpitations, orthopnea and PND.  Gastrointestinal: Negative for abdominal pain, blood in stool, constipation, diarrhea, nausea and vomiting.  Genitourinary: Negative for dysuria, frequency, hematuria and urgency.  Musculoskeletal: Positive for joint pain. Negative for falls.  Neurological: Negative for dizziness, tremors, focal weakness and headaches.  Endo/Heme/Allergies: Does not bruise/bleed easily.  Psychiatric/Behavioral: Negative  for depression. The patient does not have insomnia.     VITAL SIGNS: Blood pressure 101/60, pulse 72, temperature 97.6 F (36.4 C), temperature source Oral, resp. rate 18, height 5' (1.524 m), weight 70.3 kg (154 lb 14.4 oz), last menstrual period 12/30/2016, SpO2 94 %.  PHYSICAL EXAMINATION:   GENERAL:  46 y.o.-year-old patient lying in the bed in no significant distress.  EYES: Pupils equal, round, reactive to light and accommodation. No scleral icterus. Extraocular muscles intact.  HEENT: Head atraumatic, normocephalic. Oropharynx and nasopharynx clear. JVD is elevated NECK:  Supple, no jugular venous distention. No thyroid enlargement, no tenderness.  LUNGS: Normal breath sounds bilaterally, no wheezing, rales,rhonchi or crepitation. No use of accessory muscles of respiration.  CARDIOVASCULAR: S1, S2 normal. No murmurs, rubs, or gallops.  ABDOMEN: Soft, nontender, nondistended. Bowel sounds present. No organomegaly or mass.  EXTREMITIES: 1-2+ lower extremity and pedal edema, no cyanosis, or clubbing. Abdominal wall swelling NEUROLOGIC: Cranial nerves II through XII are intact. Muscle strength 5/5 in all extremities. Sensation intact. Gait not checked.  PSYCHIATRIC: The patient is alert and oriented x 3.  SKIN: No obvious rash, lesion, or ulcer.   ORDERS/RESULTS REVIEWED:   CBC  Recent Labs Lab 12/30/16 1311 12/31/16 0557 01/01/17 0554  WBC 10.7 11.4* 11.4*  HGB 13.2 14.1 12.9  HCT 37.1 39.2 36.0  PLT 190 212 215  MCV 111.9* 111.3* 111.2*  MCH 39.9* 40.1* 39.9*  MCHC 35.7 36.0 35.9  RDW 19.9* 19.2* 19.5*  LYMPHSABS 1.3  --   --   MONOABS 0.4  --   --   EOSABS 0.0  --   --   BASOSABS 0.0  --   --    ------------------------------------------------------------------------------------------------------------------  Chemistries   Recent Labs  Lab 12/30/16 1311 12/30/16 1754 12/31/16 0021 12/31/16 0557 12/31/16 1430 01/01/17 0554  NA 118* 123* 124* 123*  --  128*   K 3.8  --   --  4.5  --  4.7  CL 88*  --   --  92*  --  99*  CO2 18*  --   --  18*  --  22  GLUCOSE 147*  --   --  98  --  142*  BUN 25*  --   --  28*  --  25*  CREATININE 1.07*  --   --  1.14*  --  0.66  CALCIUM 8.8*  --   --  9.2  --  9.4  MG  --   --   --   --  1.9  --   AST 27  --   --  20  --  19  ALT 20  --   --  17  --  16  ALKPHOS 110  --   --  118  --  103  BILITOT 9.8*  --   --  9.1*  --  7.4*   ------------------------------------------------------------------------------------------------------------------ estimated creatinine clearance is 76.8 mL/min (by C-G formula based on SCr of 0.66 mg/dL). ------------------------------------------------------------------------------------------------------------------  Recent Labs  12/31/16 1430  TSH 1.066    Cardiac Enzymes No results for input(s): CKMB, TROPONINI, MYOGLOBIN in the last 168 hours.  Invalid input(s): CK ------------------------------------------------------------------------------------------------------------------ Invalid input(s): POCBNP ---------------------------------------------------------------------------------------------------------------  RADIOLOGY: Dg Chest Port 1 View  Result Date: 01/01/2017 CLINICAL DATA:  Hypoxia EXAM: PORTABLE CHEST 1 VIEW COMPARISON:  12/30/2016 FINDINGS: Prominent cardiac silhouette, unchanged. This could represent pericardial effusion or cardiomegaly or both. Decreased interstitial thickening in the basilar periphery may reflect a diminished degree of interstitial fluid/ edema. No confluent consolidation. IMPRESSION: Unchanged enlargement of the cardiac silhouette. Decreased interstitial thickening/ fluid. Electronically Signed   By: Andreas Newport M.D.   On: 01/01/2017 05:50    EKG:  Orders placed or performed during the hospital encounter of 12/30/16  . EKG 12-Lead  . EKG 12-Lead  . EKG 12-Lead  . EKG 12-Lead  . EKG 12-Lead  . EKG 12-Lead    ASSESSMENT AND  PLAN:  Active Problems:   Hyponatremia  #1. Hyponatremia due to nausea, vomiting and diarrhea, improving, follow in the morning. Now off Celexa , recheck sodium level in the morning. Patient may benefit from diuretic initiation due to significant fluid overload, get urine osmolality, ? Tolvaptann. Get nephrologist involved for recommendations #2. Hypotension, stable on current therapy, not on IV fluids due to fluid overload #3. Hyperbilirubinemia of unclear etiology, ?  liver disease, less likely due to sickle cell disease, direct bilirubin was about half of total bilirubin, questionable hepatic congestion due to right-sided heart failure , discussed with oncologist/hematologist yesterday, hemoglobin level has improved with improvement of cardiac function.  #4. Arrhythmia, third-degree AV block, due to digoxin, per cardiology, now off digoxin, appreciate cardiology's input, patient has receiving Digifab, resolved, now patient is in sinus rhythm, rate of 70 #5. Acute on chronic diastolic CHF, echocardiogram revealed normal ejection fraction of 50%, severe pulmonary hypertension, severe tricuspid regurgitation, pericardial effusion, moderate mitral regurgitation, initiate diuretic you. Blood pressure is better tomorrow, patient may benefit from oxygen and digoxin, per cardiologist  Management plans discussed with the patient, family and they are in agreement.   DRUG ALLERGIES: No Known Allergies  CODE STATUS:     Code Status Orders        Start  Ordered   12/30/16 1613  Full code  Continuous     12/30/16 1615    Code Status History    Date Active Date Inactive Code Status Order ID Comments User Context   06/27/2015  1:31 PM 06/29/2015  5:59 PM Full Code [id number]  Henreitta Leber, MD Inpatient      TOTAL  TIME TAKING CARE OF THIS PATIENT: 40 minutes.    Theodoro Grist M.D on 01/01/2017 at 4:45 PM  Between 7am to 6pm - Pager - [phone]  After 6pm go to [email or web address] - password  [facility]  [place] [facility]  Office  [phone]  CC: Primary care physician; Kasandra Knudsen, NP

## 2017-01-02 LAB — SODIUM: SODIUM: 133 mmol/L — AB (ref 135–145)

## 2017-01-02 LAB — HEMOGLOBIN: HEMOGLOBIN: 12.7 g/dL (ref 12.0–16.0)

## 2017-01-02 LAB — DIGOXIN LEVEL: DIGOXIN LVL: 1.1 ng/mL (ref 0.8–2.0)

## 2017-01-02 MED ORDER — BUDESONIDE-FORMOTEROL FUMARATE 160-4.5 MCG/ACT IN AERO: 2.0000 | INHALATION_SPRAY | Freq: Two times a day (BID) | RESPIRATORY_TRACT | 12 refills | Status: DC

## 2017-01-02 MED ORDER — FUROSEMIDE 40 MG PO TABS: 40.0000 mg | ORAL_TABLET | Freq: Every day | ORAL | Status: DC | PRN

## 2017-01-02 MED ORDER — HYDROCODONE-ACETAMINOPHEN 10-325 MG PO TABS: 1.0000 | ORAL_TABLET | ORAL | 0 refills | Status: DC | PRN

## 2017-01-02 MED ORDER — PREDNISONE 10 MG PO TABS: ORAL_TABLET | ORAL | 0 refills | Status: DC

## 2017-01-02 NOTE — Care Management (Signed)
Attending relays that patient will benefit from home health nursing for skilled observation assessment and monitoring due to her multiple medical problems. Patient is agreeable and agency preference is [facility] as this agency provides her oxygen.  Referral caleed

## 2017-01-02 NOTE — Progress Notes (Signed)
SUBJECTIVE: patient is still having pain in the left leg but no shortness of breath or chest pain.   Vitals:   01/01/17 0441 01/01/17 1959 01/01/17 2132 01/02/17 0427  BP: 101/60 108/70  103/67  Pulse: 72 72  (!) 105  Resp: 18 18  18   Temp: 97.6 F (36.4 C) 97.8 F (36.6 C)  98 F (36.7 C)  TempSrc: Oral Oral  Oral  SpO2: 94% 96% 93% (!) 89%  Weight: 154 lb 14.4 oz (70.3 kg)     Height:        Intake/Output Summary (Last 24 hours) at 01/02/17 0851 Last data filed at 01/02/17 0753  Gross per 24 hour  Intake                3 ml  Output             2600 ml  Net            -2597 ml    LABS: Basic Metabolic Panel:  Recent Labs  12/31/16 0557 12/31/16 1430 01/01/17 0554 01/02/17 0435  NA 123*  --  128* 133*  K 4.5  --  4.7  --   CL 92*  --  99*  --   CO2 18*  --  22  --   GLUCOSE 98  --  142*  --   BUN 28*  --  25*  --   CREATININE 1.14*  --  0.66  --   CALCIUM 9.2  --  9.4  --   MG  --  1.9  --   --    Liver Function Tests:  Recent Labs  12/31/16 0557 01/01/17 0554  AST 20 19  ALT 17 16  ALKPHOS 118 103  BILITOT 9.1* 7.4*  PROT 7.5 7.2  ALBUMIN 4.0 3.8   No results for input(s): LIPASE, AMYLASE in the last 72 hours. CBC:  Recent Labs  12/30/16 1311 12/31/16 0557 01/01/17 0554 01/02/17 0435  WBC 10.7 11.4* 11.4*  --   NEUTROABS 9.0*  --   --   --   HGB 13.2 14.1 12.9 12.7  HCT 37.1 39.2 36.0  --   MCV 111.9* 111.3* 111.2*  --   PLT 190 212 215  --    Cardiac Enzymes: No results for input(s): CKTOTAL, CKMB, CKMBINDEX, TROPONINI in the last 72 hours. BNP: Invalid input(s): POCBNP D-Dimer: No results for input(s): DDIMER in the last 72 hours. Hemoglobin A1C: No results for input(s): HGBA1C in the last 72 hours. Fasting Lipid Panel: No results for input(s): CHOL, HDL, LDLCALC, TRIG, CHOLHDL, LDLDIRECT in the last 72 hours. Thyroid Function Tests:  Recent Labs  12/31/16 1430  TSH 1.066   Anemia Panel:  Recent Labs  12/30/16 1311   RETICCTPCT 11.1*     PHYSICAL EXAM General: Well developed, well nourished, in no acute distress HEENT:  Normocephalic and atramatic Neck:  No JVD.  Lungs: Clear bilaterally to auscultation and percussion. Heart: HRRR . Normal S1 and S2 without gallops or murmurs.  Abdomen: Bowel sounds are positive, abdomen soft and non-tender  Msk:  Back normal, normal gait. Normal strength and tone for age. Extremities: No clubbing, cyanosis or edema.   Neuro: Alert and oriented X 3. Psych:  Good affect, responds appropriately  TELEMETRY: sinus rhythm  ASSESSMENT AND PLAN: congestive heart failure with history of severe LV dysfunction with left ventricular ejection fractio 20% presented to the hospital with hyponatremia and left leg pain. Patient does not have  DVT and cardiac-wise denies any chest pain or shortneof breath.LVEF is 50% on recent echo.  Active Problems:   Hyponatremia    Dionisio David, MD, [facility] 01/02/2017 8:51 AM

## 2017-01-02 NOTE — Progress Notes (Signed)
SUBJECTIVE states that pain in the left leg is tolerable Serum sodium has improved to 133 Recent echo suggests that EF has improved  Medications:  Current medications: Current Facility-Administered Medications  Medication Dose Route Frequency Provider Last Rate Last Dose  . acetaminophen (TYLENOL) tablet 650 mg  650 mg Oral Q6H PRN Hillary Bow, MD   650 mg at 12/30/16 2202   Or  . acetaminophen (TYLENOL) suppository 650 mg  650 mg Rectal Q6H PRN Srikar Sudini, MD      . albuterol (PROVENTIL) (2.5 MG/3ML) 0.083% nebulizer solution 2.5 mg  2.5 mg Nebulization Q4H PRN Theodoro Grist, MD      . budesonide (PULMICORT) nebulizer solution 0.25 mg  0.25 mg Nebulization BID Theodoro Grist, MD   0.25 mg at 01/02/17 0846  . enoxaparin (LOVENOX) injection 40 mg  40 mg Subcutaneous Q24H Hillary Bow, MD   40 mg at 01/01/17 1743  . famotidine (PEPCID) tablet 20 mg  20 mg Oral BID Murlean Iba, MD   20 mg at 01/02/17 1032  . feeding supplement (ENSURE ENLIVE) (ENSURE ENLIVE) liquid 237 mL  237 mL Oral TID BM Theodoro Grist, MD   237 mL at 01/02/17 1031  . HYDROcodone-acetaminophen (NORCO) 10-325 MG per tablet 1 tablet  1 tablet Oral Q4H PRN Theodoro Grist, MD   1 tablet at 01/02/17 1326  . loratadine (CLARITIN) tablet 10 mg  10 mg Oral Daily Hillary Bow, MD   10 mg at 01/02/17 1032  . methylPREDNISolone sodium succinate (SOLU-MEDROL) 125 mg/2 mL injection 60 mg  60 mg Intravenous Q6H Theodoro Grist, MD   60 mg at 01/02/17 0836  . morphine 4 MG/ML injection 4 mg  4 mg Intravenous Q4H PRN Lance Coon, MD   4 mg at 01/02/17 1047  . nicotine (NICODERM CQ - dosed in mg/24 hours) patch 21 mg  21 mg Transdermal Daily Theodoro Grist, MD   21 mg at 01/02/17 1031  . ondansetron (ZOFRAN) injection 4 mg  4 mg Intravenous Once Maine, MD      . ondansetron [facility]) tablet 4 mg  4 mg Oral Q6H PRN Hillary Bow, MD       Or  . ondansetron (ZOFRAN) injection 4 mg  4 mg Intravenous Q6H PRN Hillary Bow,  MD   4 mg at 12/30/16 1822  . polyethylene glycol (MIRALAX / GLYCOLAX) packet 17 g  17 g Oral Daily PRN Srikar Sudini, MD      . sodium chloride flush (NS) 0.9 % injection 3 mL  3 mL Intravenous Q12H Srikar Sudini, MD   3 mL at 01/02/17 1032  . tiotropium (SPIRIVA) inhalation capsule 18 mcg  18 mcg Inhalation Daily Hillary Bow, MD   18 mcg at 01/02/17 [id number]      Allergies: No Known Allergies    Past Medical History: Past Medical History:  Diagnosis Date  . Congestive heart failure (CHF) ([place]) 06/27/15  . COPD (chronic obstructive pulmonary disease) ([place]) 06/27/15  . Hyperbilirubinemia   . 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 Sickle cell anemia (H[facility])      Past Surgical History: Past Surgical History:  Procedure Laterality Date  . CESAREAN SECTION  1989  . CHOLECYSTECTOMY  1996  . EUS N/A 07/13/2015   Procedure: LOWER ENDOSCOPIC ULTRASOUND (EUS);  Surgeon: Cora Daniels, MD;  Location: [facility] ENDOSCOPY;  Service: Endoscopy;  Laterality: N/A;        Vital Signs: Blood pressure 121/81, pulse 78, temperature 98 F (36.7 C), temperature source Oral,  resp. rate 20, height 5' (1.524 m), weight 68.4 kg (150 lb 11.2 oz), last menstrual period 12/30/2016, SpO2 91 %.   Intake/Output Summary (Last 24 hours) at 01/02/17 1326 Last data filed at 01/02/17 1025  Gross per 24 hour  Intake              243 ml  Output             2600 ml  Net            -2357 ml    Weight trends: Filed Weights   12/31/16 0353 01/01/17 0441 01/02/17 1130  Weight: 68.4 kg (150 lb 12.7 oz) 70.3 kg (154 lb 14.4 oz) 68.4 kg (150 lb 11.2 oz)    Physical Exam: General:  No acute distress, laying in the bed some discomfort from pain   HEENT Jaundice noted, moist oral mucous membranes   Neck:  Supple   Lungs: Some mild diffuse wheezing, normal breathing effort   Heart::  Regular rhythm, soft systolic murmur   Abdomen: Soft, nontender, nondistended   Extremities:  1+ pitting edema bilaterally    Neurologic: Alert, oriented   Skin: No acute rashes              Lab results: Basic Metabolic Panel:  Recent Labs Lab 12/30/16 1311  12/31/16 0557 12/31/16 1430 01/01/17 0554 01/02/17 0435  NA 118*  < > 123*  --  128* 133*  K 3.8  --  4.5  --  4.7  --   CL 88*  --  92*  --  99*  --   CO2 18*  --  18*  --  22  --   GLUCOSE 147*  --  98  --  142*  --   BUN 25*  --  28*  --  25*  --   CREATININE 1.07*  --  1.14*  --  0.66  --   CALCIUM 8.8*  --  9.2  --  9.4  --   MG  --   --   --  1.9  --   --   < > = values in this interval not displayed.  Liver Function Tests:  Recent Labs Lab 01/01/17 0554  AST 19  ALT 16  ALKPHOS 103  BILITOT 7.4*  PROT 7.2  ALBUMIN 3.8   No results for input(s): LIPASE, AMYLASE in the last 168 hours. No results for input(s): AMMONIA in the last 168 hours.  CBC:  Recent Labs Lab 12/30/16 1311 12/31/16 0557 01/01/17 0554 01/02/17 0435  WBC 10.7 11.4* 11.4*  --   NEUTROABS 9.0*  --   --   --   HGB 13.2 14.1 12.9 12.7  HCT 37.1 39.2 36.0  --   MCV 111.9* 111.3* 111.2*  --   PLT 190 212 215  --     Cardiac Enzymes: No results for input(s): CKTOTAL, TROPONINI in the last 168 hours.  BNP: Invalid input(s): POCBNP  CBG:  Recent Labs Lab 12/30/16 2117 12/31/16 0741 12/31/16 1126  GLUCAP 122* 103* 86    Microbiology: No results found for this or any previous visit (from the past 720 hour(s)).   Coagulation Studies:  Recent Labs  12/31/16 1430  LABPROT 15.9*  INR 1.26    Urinalysis: No results for input(s): COLORURINE, LABSPEC, PHURINE, GLUCOSEU, HGBUR, BILIRUBINUR, KETONESUR, PROTEINUR, UROBILINOGEN, NITRITE, LEUKOCYTESUR in the last 72 hours.  Invalid input(s): APPERANCEUR      Imaging: Dg Chest [facility] [address]  Result Date: 01/01/2017 CLINICAL DATA:  Hypoxia EXAM: PORTABLE CHEST 1 VIEW COMPARISON:  12/30/2016 FINDINGS: Prominent cardiac silhouette, unchanged. This could represent pericardial effusion or  cardiomegaly or both. Decreased interstitial thickening in the basilar periphery may reflect a diminished degree of interstitial fluid/ edema. No confluent consolidation. IMPRESSION: Unchanged enlargement of the cardiac silhouette. Decreased interstitial thickening/ fluid. Electronically Signed   By: Andreas Newport M.D.   On: 01/01/2017 05:50     Assessment & Plan: Pt is a 46 y.o. 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   1. Hyponatremia, likely from excessive water consumption at home during GI illness Baseline sodium appears to be 141 from June 2016 Admission sodium is 118. It has improved slightly from 133 today  Plan: S Creatinine is at baseline Can consider lasix 20 mg daily prn.  Higher doses of lasix pose a risk of precipitating sickle cell crisis due to volume depletion but it has to be balance against volume overload. Patient will need to keep a strict tab on her weight and adjust lasix dose accordingly.

## 2017-01-02 NOTE — Discharge Summary (Signed)
Shannon Schneider at [place] NAME: Shannon Schneider    MR#:  [id number]  DATE OF BIRTH:  January 02, 1971  DATE OF ADMISSION:  12/30/2016 ADMITTING PHYSICIAN: Hillary Bow, MD  DATE OF DISCHARGE: 01/02/2017  2:00 PM  PRIMARY CARE PHYSICIAN: HOLLAND, [place], NP    ADMISSION DIAGNOSIS:  Hyponatremia [E87.1] Vomiting and diarrhea [R11.10, R19.7] Sickle cell pain crisis ([place]) [D57.00] Atrial fibrillation, unspecified type ([place]) [I48.91]  DISCHARGE DIAGNOSIS:  Active Problems:   Hyponatremia   SECONDARY DIAGNOSIS:   Past Medical History:  Diagnosis Date  . Congestive heart failure (CHF) ([place]) 06/27/15  . COPD (chronic obstructive pulmonary disease) ([place]) 06/27/15  . Hyperbilirubinemia   . 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 Sickle cell anemia [facility])     HOSPITAL COURSE:   46 year old female with past medical history of COPD with ongoing tobacco abuse, CHF, sickle cell anemia who was admitted to the hospital due to left leg pain and sickle cell crisis.  1. Sickle cell anemia with crisis-patient was treated aggressively with IV fluids, pain control. -There was no evidence of acute chest syndrome, patient was seen by hematology and oncology who recommended continuing supportive care. I shortness of breath was more related to COPD exacerbation rather than CHF. -She has clinically improved with supportive care and now being discharged home.  2. COPD exacerbation-secondary to ongoing tobacco abuse. -Patient was treated with IV steroids, scheduled DuoNeb nebs, Pulmicort nebs. She has improved and now being discharged on oral prednisone taper and inhalers including Symbicort, Spiriva and albuterol inhaler as needed. She was strongly advised to quit smoking. She is already on oxygen at home.  3. Hyponatremia-this was thought to be hypovolemic in nature and secondary to use of diuretics. She was seen by nephrology she was given some gentle IV fluids and her sodium is improved  and now normalized. Her diuretics were changed to just as needed.  4. CHF-patient did not have any decompensated CHF on the hospital. -She will continue her Coreg, her digoxin was discontinued. She will continue her Delene Loll, she was seen by cardiology and the plan was discussed with them and they agreed with the management. -Patient has had previous severe LV dysfunction with EF of 20% but now improved to 50% continue follow-up and further care as per cardiology.  5. Depression-patient will continue her Celexa.  Patient was discharged home with home health nursing services.  DISCHARGE CONDITIONS:   Stable  CONSULTS OBTAINED:  Treatment Team:  Lavonia Dana, MD Murlean Iba, MD Dionisio David, MD Lequita Asal, MD Sindy Guadeloupe, MD  DRUG ALLERGIES:  No Known Allergies  DISCHARGE MEDICATIONS:   Allergies as of 01/02/2017   No Known Allergies     Medication List    STOP taking these medications   digoxin 0.25 MG tablet Commonly known as:  LANOXIN     TAKE these medications   albuterol 108 (90 Base) MCG/ACT inhaler Commonly known as:  PROVENTIL HFA;VENTOLIN HFA Inhale 2 puffs into the lungs every 6 (six) hours as needed for wheezing.   budesonide-formoterol 160-4.5 MCG/ACT inhaler Commonly known as:  SYMBICORT Inhale 2 puffs into the lungs 2 (two) times daily.   carvedilol 3.125 MG tablet Commonly known as:  COREG Take 3.125 mg by mouth 2 (two) times daily with a meal.   citalopram 10 MG tablet Commonly known as:  CELEXA Take 10 mg by mouth at bedtime.   ENTRESTO 24-26 MG Generic drug:  sacubitril-valsartan Take 1 tablet by  mouth 2 (two) times daily.   ergocalciferol 50000 units capsule Commonly known as:  VITAMIN D2 Take 50,000 Units by mouth once a week.   furosemide 40 MG tablet Commonly known as:  LASIX Take 1 tablet (40 mg total) by mouth daily as needed for fluid or edema. What changed:  when to take this  reasons to take this    HYDROcodone-acetaminophen 10-325 MG tablet Commonly known as:  NORCO Take 1 tablet by mouth every 4 (four) hours as needed for moderate pain or severe pain.   loratadine 10 MG tablet Commonly known as:  CLARITIN Take 10 mg by mouth daily.   OXYGEN Inhale 2 L into the lungs continuous.   predniSONE 10 MG tablet Commonly known as:  DELTASONE Label  & dispense according to the schedule below. 5 Pills PO for 1 day then, 4 Pills PO for 1 day, 3 Pills PO for 1 day, 2 Pills PO for 1 day, 1 Pill PO for 1 days then STOP.   SPIRIVA RESPIMAT 2.5 MCG/ACT Aers Generic drug:  Tiotropium Bromide Monohydrate Inhale 1 puff into the lungs 2 (two) times daily.         DISCHARGE INSTRUCTIONS:   DIET:  Cardiac diet  DISCHARGE CONDITION:  Stable  ACTIVITY:  Activity as tolerated  OXYGEN:  Home Oxygen: Yes.     Oxygen Delivery: 2 liters/min via Patient connected to nasal cannula oxygen  DISCHARGE LOCATION:  Home with [place].    If you experience worsening of your admission symptoms, develop shortness of breath, life threatening emergency, suicidal or homicidal thoughts you must seek medical attention immediately by calling 911 or calling your MD immediately  if symptoms less severe.  You Must read complete instructions/literature along with all the possible adverse reactions/side effects for all the Medicines you take and that have been prescribed to you. Take any new Medicines after you have completely understood and accpet all the possible adverse reactions/side effects.   Please note  You were cared for by a hospitalist during your hospital stay. If you have any questions about your discharge medications or the care you received while you were in the hospital after you are discharged, you can call the unit and asked to speak with the hospitalist on call if the hospitalist that took care of you is not available. Once you are discharged, your primary care physician will  handle any further medical issues. Please note that NO REFILLS for any discharge medications will be authorized once you are discharged, as it is imperative that you return to your primary care physician (or establish a relationship with a primary care physician if you do not have one) for your aftercare needs so that they can reassess your need for medications and monitor your lab values.     Today   Shortness of breath improved.  Left leg pain improved.  Wants to go home.   VITAL SIGNS:  Blood pressure 121/81, pulse 78, temperature 98 F (36.7 C), temperature source Oral, resp. rate 20, height 5' (1.524 m), weight 68.4 kg (150 lb 11.2 oz), last menstrual period 12/30/2016, SpO2 91 %.  I/O:   Intake/Output Summary (Last 24 hours) at 01/02/17 1726 Last data filed at 01/02/17 1440  Gross per 24 hour  Intake              483 ml  Output             2600 ml  Net            -  2117 ml    PHYSICAL EXAMINATION:  GENERAL:  46 y.o.-year-old patient lying in the bed in no acute distress.  EYES: Pupils equal, round, reactive to light and accommodation. No scleral icterus. Extraocular muscles intact.  HEENT: Head atraumatic, normocephalic. Oropharynx and nasopharynx clear.  NECK:  Supple, no jugular venous distention. No thyroid enlargement, no tenderness.  LUNGS: Normal breath sounds bilaterally, no wheezing, rales,rhonchi. No use of accessory muscles of respiration.  CARDIOVASCULAR: S1, S2 normal. No murmurs, rubs, or gallops.  ABDOMEN: Soft, non-tender, non-distended. Bowel sounds present. No organomegaly or mass.  EXTREMITIES: Trace pitting edema b/l, No cyanosis, or clubbing.  NEUROLOGIC: Cranial nerves II through XII are intact. No focal motor or sensory defecits b/l.  PSYCHIATRIC: The patient is alert and oriented x 3. Good affect.  SKIN: No obvious rash, lesion, or ulcer.   DATA REVIEW:   CBC  Recent Labs Lab 01/01/17 0554 01/02/17 0435  WBC 11.4*  --   HGB 12.9 12.7  HCT 36.0   --   PLT 215  --     Chemistries   Recent Labs Lab 12/31/16 1430 01/01/17 0554 01/02/17 0435  NA  --  128* 133*  K  --  4.7  --   CL  --  99*  --   CO2  --  22  --   GLUCOSE  --  142*  --   BUN  --  25*  --   CREATININE  --  0.66  --   CALCIUM  --  9.4  --   MG 1.9  --   --   AST  --  19  --   ALT  --  16  --   ALKPHOS  --  103  --   BILITOT  --  7.4*  --     Cardiac Enzymes No results for input(s): TROPONINI in the last 168 hours.  Microbiology Results  No results found for this or any previous visit.  RADIOLOGY:  Dg Chest Port 1 View  Result Date: 01/01/2017 CLINICAL DATA:  Hypoxia EXAM: PORTABLE CHEST 1 VIEW COMPARISON:  12/30/2016 FINDINGS: Prominent cardiac silhouette, unchanged. This could represent pericardial effusion or cardiomegaly or both. Decreased interstitial thickening in the basilar periphery may reflect a diminished degree of interstitial fluid/ edema. No confluent consolidation. IMPRESSION: Unchanged enlargement of the cardiac silhouette. Decreased interstitial thickening/ fluid. Electronically Signed   By: Andreas Newport M.D.   On: 01/01/2017 05:50      Management plans discussed with the patient, family and they are in agreement.  CODE STATUS:     Code Status Orders        Start     Ordered   12/30/16 1613  Full code  Continuous     12/30/16 1615    Code Status History    Date Active Date Inactive Code Status Order ID Comments User Context   06/27/2015  1:31 PM 06/29/2015  5:59 PM Full Code [id number]  Henreitta Leber, MD Inpatient      TOTAL TIME TAKING CARE OF THIS PATIENT: 40 minutes.    Henreitta Leber M.D on 01/02/2017 at 5:26 PM  Between 7am to 6pm - Pager - [phone]  After 6pm go to [email or web address] - [employer] [place] [facility]  Office  [phone]  CC: Primary care physician; Kasandra Knudsen, NP

## 2017-01-06 ENCOUNTER — Telehealth: Payer: Self-pay | Admitting: *Deleted

## 2017-01-06 NOTE — Telephone Encounter (Signed)
Got a call back from [place] from Dr Felisa Bonier office and was told to fax the notes and labs to [phone]. All papers including labs from her hospital admission.was sent. To the above number

## 2017-01-06 NOTE — Telephone Encounter (Signed)
Called and spoke to [place] at main number [phone] and he trf me to sickle cell clinic and I spoke to [place] who states that I need to talk to [facility] and then got her voicemail and left message that pt was in hospital at [facility] and when she was there Dr. Janese Banks here at [facility] called and spoke to Dr. Felisa Bonier and he said that when pt is d/c she will need an appt with him at [facility].  I left my name and number to call me back so that  I can fax them notes.

## 2017-01-08 NOTE — Progress Notes (Signed)
[facility]  Patient Status: Not taken under care, patient was interested in personal care services instead of [place], alerted Joni Reining, CM and Marshell Garfinkel, CM.   Shannon Schneider 01/08/2017, 9:36 AM

## 2017-01-22 ENCOUNTER — Ambulatory Visit
Admission: RE | Admit: 2017-01-22 | Discharge: 2017-01-22 | Disposition: A | Discharge status: Home / Self Care | Payer: BLUE CROSS/BLUE SHIELD | Source: Ambulatory Visit | Attending: Specialist | Admitting: Specialist

## 2017-01-22 ENCOUNTER — Other Ambulatory Visit: Payer: Self-pay | Admitting: Specialist

## 2017-01-22 DIAGNOSIS — R600 Localized edema: Secondary | ICD-10-CM

## 2017-02-25 ENCOUNTER — Ambulatory Visit
Admission: RE | Admit: 2017-02-25 | Discharge: 2017-02-25 | Disposition: A | Discharge status: Home / Self Care | Payer: BLUE CROSS/BLUE SHIELD | Source: Ambulatory Visit | Attending: Nurse Practitioner | Admitting: Nurse Practitioner

## 2017-02-25 DIAGNOSIS — Z1231 Encounter for screening mammogram for malignant neoplasm of breast: Secondary | ICD-10-CM | POA: Insufficient documentation

## 2017-03-06 ENCOUNTER — Encounter: Payer: Self-pay | Admitting: General Surgery

## 2017-03-06 ENCOUNTER — Ambulatory Visit (INDEPENDENT_AMBULATORY_CARE_PROVIDER_SITE_OTHER): Payer: BLUE CROSS/BLUE SHIELD | Admitting: General Surgery

## 2017-03-06 VITALS — BP 102/62 | HR 94 | Resp 16 | Ht 60.0 in | Wt 141.0 lb

## 2017-03-06 DIAGNOSIS — K6289 Other specified diseases of anus and rectum: Secondary | ICD-10-CM

## 2017-03-06 NOTE — Progress Notes (Signed)
Patient ID: Shannon Schneider, female   DOB: 09-22-1971, 46 y.o.   MRN: [id number]  Chief Complaint  Patient presents with  . Pre-op Exam    HPI Shannon Schneider is a 46 y.o. female here today To discuss options for management of her previously identified perirectal mass.  Since her last visit she has been evaluated by Larey Days, M.D. from GYN. That evaluation included a transabdominal and transvaginal ultrasound. Per report from Dr. Leonides Schanz was that no ovarian mass was identified as suggested at the time of her May 2017 at an outside facility.  Since the patient's last visit that she was admitted in January of this year with hyponatremia as well as sickle crisis. Long-standing atrial fibrillation.  At this time she is asymptomatic. She continues on supplemental oxygen. Cardiac evaluation completed by Neoma Laming, M.D. (cardiology) showed her to be low to moderate risk for surgical intervention.  HPI  Past Medical History:  Diagnosis Date  . Congestive heart failure (CHF) ([place]) 06/27/15  . COPD (chronic obstructive pulmonary disease) ([place]) 06/27/15  . Hyperbilirubinemia   . 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 Sickle cell anemia ([facility])     Past Surgical History:  Procedure Laterality Date  . CESAREAN SECTION  1989  . CHOLECYSTECTOMY  1996  . EUS N/A 07/13/2015   Procedure: LOWER ENDOSCOPIC ULTRASOUND (EUS);  Surgeon: Cora Daniels, MD;  Location: [facility] ENDOSCOPY;  Service: Endoscopy;  Laterality: N/A;    Family History  Problem Relation Age of Onset  . Renal Disease Mother   . Diabetes Mother   . Hypertension Mother   . Emphysema Father   . Breast cancer Neg Hx     Social History Social History  Substance Use Topics  . Smoking status: Current Every Day Smoker    Packs/day: 0.25    Years: 20.00    Types: Cigarettes  . Smokeless tobacco: Never Used  . Alcohol use 0.0 oz/week     Comment: occasionally    No Known Allergies  Current Outpatient Prescriptions  Medication Sig  Dispense Refill  . albuterol (PROVENTIL HFA;VENTOLIN HFA) 108 (90 Base) MCG/ACT inhaler Inhale 2 puffs into the lungs every 6 (six) hours as needed for wheezing.    . budesonide-formoterol (SYMBICORT) 160-4.5 MCG/ACT inhaler Inhale 2 puffs into the lungs 2 (two) times daily. 1 Inhaler 12  . carvedilol (COREG) 3.125 MG tablet Take 3.125 mg by mouth 2 (two) times daily with a meal.     . citalopram (CELEXA) 10 MG tablet Take 10 mg by mouth at bedtime.    . ergocalciferol (VITAMIN D2) 50000 units capsule Take 50,000 Units by mouth once a week.    . furosemide (LASIX) 40 MG tablet Take 1 tablet (40 mg total) by mouth daily as needed for fluid or edema. 30 tablet   . loratadine (CLARITIN) 10 MG tablet Take 10 mg by mouth daily.    . OXYGEN Inhale 2 L into the lungs continuous.     . sacubitril-valsartan (ENTRESTO) 24-26 MG Take 1 tablet by mouth 2 (two) times daily.    . Tiotropium Bromide Monohydrate (SPIRIVA RESPIMAT) 2.5 MCG/ACT AERS Inhale 1 puff into the lungs 2 (two) times daily.     No current facility-administered medications for this visit.     Review of Systems Review of Systems  Constitutional: Negative.   Respiratory: Negative.   Cardiovascular: Negative.     Blood pressure 102/62, pulse 94, resp. rate 16, height 5' (1.524 m), weight 141 lb (64 kg),  last menstrual period 12/30/2016, SpO2 94 %.  Physical Exam Physical Exam Deferred.  Data Reviewed See summary above.  Assessment    Perirectal mass, previous CT showing evidence of left ovarian mass with recent normal ultrasound.    Plan  I think the patient will be best [facility] a [facility] setting, especially in light of her sickle cell disease and chronic pulmonary and cardiac compromise. While she's reported stable by the cardiology service, she is at risk for acute decompensation should any surgical complications develop.  She is followed at the sickle cell clinic at [facility], and for that reason we'll make arrangements  for evaluation by the colorectal service at that institution.  We'll obtain repeat imaging at a [facility] facility so those records are available for so specialty review.    Patient has been scheduled for a CT abdomen/pelvis with IV and oral contrast at [facility] for 03-12-17 (11 am scan time; arrive 9:45 am to allow time for check-in and to drink prep). Prep: NPO 6 hours prior. Patient verbalizes understanding.         Robert Bellow 03/07/2017, 6:41 AM

## 2017-03-12 ENCOUNTER — Encounter: Payer: Self-pay | Admitting: Emergency Medicine

## 2017-03-12 ENCOUNTER — Emergency Department: Payer: BLUE CROSS/BLUE SHIELD

## 2017-03-12 ENCOUNTER — Inpatient Hospital Stay
Admission: EM | Admit: 2017-03-12 | Discharge: 2017-03-17 | DRG: 291 | Disposition: A | Discharge status: Home / Self Care | Payer: BLUE CROSS/BLUE SHIELD | Attending: Internal Medicine | Admitting: Internal Medicine

## 2017-03-12 DIAGNOSIS — I4892 Unspecified atrial flutter: Secondary | ICD-10-CM | POA: Diagnosis present

## 2017-03-12 DIAGNOSIS — K629 Disease of anus and rectum, unspecified: Secondary | ICD-10-CM | POA: Diagnosis present

## 2017-03-12 DIAGNOSIS — I4891 Unspecified atrial fibrillation: Secondary | ICD-10-CM | POA: Diagnosis present

## 2017-03-12 DIAGNOSIS — D571 Sickle-cell disease without crisis: Secondary | ICD-10-CM | POA: Diagnosis present

## 2017-03-12 DIAGNOSIS — J189 Pneumonia, unspecified organism: Secondary | ICD-10-CM | POA: Diagnosis present

## 2017-03-12 DIAGNOSIS — I272 Pulmonary hypertension, unspecified: Secondary | ICD-10-CM | POA: Diagnosis present

## 2017-03-12 DIAGNOSIS — E871 Hypo-osmolality and hyponatremia: Secondary | ICD-10-CM | POA: Diagnosis present

## 2017-03-12 DIAGNOSIS — I959 Hypotension, unspecified: Secondary | ICD-10-CM | POA: Diagnosis not present

## 2017-03-12 DIAGNOSIS — I081 Rheumatic disorders of both mitral and tricuspid valves: Secondary | ICD-10-CM | POA: Diagnosis present

## 2017-03-12 DIAGNOSIS — F17210 Nicotine dependence, cigarettes, uncomplicated: Secondary | ICD-10-CM | POA: Diagnosis present

## 2017-03-12 DIAGNOSIS — Z9981 Dependence on supplemental oxygen: Secondary | ICD-10-CM

## 2017-03-12 DIAGNOSIS — J441 Chronic obstructive pulmonary disease with (acute) exacerbation: Secondary | ICD-10-CM | POA: Diagnosis present

## 2017-03-12 DIAGNOSIS — Z79899 Other long term (current) drug therapy: Secondary | ICD-10-CM

## 2017-03-12 DIAGNOSIS — R17 Unspecified jaundice: Secondary | ICD-10-CM | POA: Diagnosis present

## 2017-03-12 DIAGNOSIS — J9621 Acute and chronic respiratory failure with hypoxia: Secondary | ICD-10-CM | POA: Diagnosis present

## 2017-03-12 DIAGNOSIS — R188 Other ascites: Secondary | ICD-10-CM | POA: Diagnosis present

## 2017-03-12 DIAGNOSIS — I248 Other forms of acute ischemic heart disease: Secondary | ICD-10-CM | POA: Diagnosis present

## 2017-03-12 DIAGNOSIS — R778 Other specified abnormalities of plasma proteins: Secondary | ICD-10-CM

## 2017-03-12 DIAGNOSIS — R7989 Other specified abnormal findings of blood chemistry: Secondary | ICD-10-CM

## 2017-03-12 DIAGNOSIS — Z7951 Long term (current) use of inhaled steroids: Secondary | ICD-10-CM | POA: Diagnosis not present

## 2017-03-12 DIAGNOSIS — R0602 Shortness of breath: Secondary | ICD-10-CM | POA: Diagnosis present

## 2017-03-12 DIAGNOSIS — I509 Heart failure, unspecified: Secondary | ICD-10-CM

## 2017-03-12 DIAGNOSIS — I5043 Acute on chronic combined systolic (congestive) and diastolic (congestive) heart failure: Secondary | ICD-10-CM | POA: Diagnosis present

## 2017-03-12 HISTORY — DX: Other specified diseases of anus and rectum: K62.89

## 2017-03-12 LAB — CBC WITH DIFFERENTIAL/PLATELET
BAND NEUTROPHILS: 0 %
BASOS ABS: 0.1 10*3/uL (ref 0–0.1)
BLASTS: 0 %
Basophils Relative: 1 %
EOS ABS: 0 10*3/uL (ref 0–0.7)
Eosinophils Relative: 0 %
HCT: 36.3 % (ref 35.0–47.0)
HEMOGLOBIN: 12.4 g/dL (ref 12.0–16.0)
LYMPHS PCT: 13 %
Lymphs Abs: 1.5 10*3/uL (ref 1.0–3.6)
MCH: 40.4 pg — ABNORMAL HIGH (ref 26.0–34.0)
MCHC: 34.3 g/dL (ref 32.0–36.0)
MCV: 117.6 fL — ABNORMAL HIGH (ref 80.0–100.0)
METAMYELOCYTES PCT: 2 %
Monocytes Absolute: 0.7 10*3/uL (ref 0.2–0.9)
Monocytes Relative: 6 %
Myelocytes: 0 %
Neutro Abs: 9.6 10*3/uL — ABNORMAL HIGH (ref 1.4–6.5)
Neutrophils Relative %: 78 %
Other: 0 %
PROMYELOCYTES ABS: 0 %
Platelets: 320 10*3/uL (ref 150–440)
RBC: 3.09 MIL/uL — ABNORMAL LOW (ref 3.80–5.20)
RDW: 21.6 % — ABNORMAL HIGH (ref 11.5–14.5)
WBC: 11.9 10*3/uL — ABNORMAL HIGH (ref 3.6–11.0)
nRBC: 40 /100 WBC — ABNORMAL HIGH

## 2017-03-12 LAB — BLOOD GAS, ARTERIAL
Acid-base deficit: 5.2 mmol/L — ABNORMAL HIGH (ref 0.0–2.0)
Bicarbonate: 20.6 mmol/L (ref 20.0–28.0)
FIO2: 0.44
O2 SAT: 88.8 %
PATIENT TEMPERATURE: 37
PO2 ART: 61 mmHg — AB (ref 83.0–108.0)
pCO2 arterial: 40 mmHg (ref 32.0–48.0)
pH, Arterial: 7.32 — ABNORMAL LOW (ref 7.350–7.450)

## 2017-03-12 LAB — BASIC METABOLIC PANEL
ANION GAP: 9 (ref 5–15)
BUN: 20 mg/dL (ref 6–20)
CALCIUM: 9.2 mg/dL (ref 8.9–10.3)
CO2: 26 mmol/L (ref 22–32)
Chloride: 99 mmol/L — ABNORMAL LOW (ref 101–111)
Creatinine, Ser: 0.51 mg/dL (ref 0.44–1.00)
GFR calc non Af Amer: >60 mL/min (ref >60)
Glucose, Bld: 112 mg/dL — ABNORMAL HIGH (ref 65–99)
Potassium: 5 mmol/L (ref 3.5–5.1)
Sodium: 134 mmol/L — ABNORMAL LOW (ref 135–145)

## 2017-03-12 LAB — CBC
HCT: 36.6 % (ref 35.0–47.0)
HEMOGLOBIN: 12.6 g/dL (ref 12.0–16.0)
MCH: 40.3 pg — AB (ref 26.0–34.0)
MCHC: 34.5 g/dL (ref 32.0–36.0)
MCV: 117 fL — ABNORMAL HIGH (ref 80.0–100.0)
PLATELETS: 302 10*3/uL (ref 150–440)
RBC: 3.13 MIL/uL — ABNORMAL LOW (ref 3.80–5.20)
RDW: 22.5 % — ABNORMAL HIGH (ref 11.5–14.5)
WBC: 11.2 10*3/uL — AB (ref 3.6–11.0)

## 2017-03-12 LAB — HEPATIC FUNCTION PANEL
ALT: 11 U/L — ABNORMAL LOW (ref 14–54)
AST: 24 U/L (ref 15–41)
Albumin: 3.6 g/dL (ref 3.5–5.0)
Alkaline Phosphatase: 135 U/L — ABNORMAL HIGH (ref 38–126)
BILIRUBIN INDIRECT: 5.7 mg/dL — AB (ref 0.3–0.9)
Bilirubin, Direct: 3.7 mg/dL — ABNORMAL HIGH (ref 0.1–0.5)
TOTAL PROTEIN: 6.9 g/dL (ref 6.5–8.1)
Total Bilirubin: 9.4 mg/dL — ABNORMAL HIGH (ref 0.3–1.2)

## 2017-03-12 LAB — TROPONIN I
TROPONIN I: 0.05 ng/mL — AB (ref <0.03)
Troponin I: 0.06 ng/mL (ref <0.03)

## 2017-03-12 LAB — BRAIN NATRIURETIC PEPTIDE: B NATRIURETIC PEPTIDE 5: 1525 pg/mL — AB (ref 0.0–100.0)

## 2017-03-12 LAB — MRSA PCR SCREENING: MRSA BY PCR: NEGATIVE

## 2017-03-12 LAB — GLUCOSE, CAPILLARY: GLUCOSE-CAPILLARY: 145 mg/dL — AB (ref 65–99)

## 2017-03-12 MED ORDER — IPRATROPIUM-ALBUTEROL 0.5-2.5 (3) MG/3ML IN SOLN: 3.0000 mL | RESPIRATORY_TRACT | Status: DC | PRN

## 2017-03-12 MED ORDER — LORATADINE 10 MG PO TABS
10.0000 mg | ORAL_TABLET | Freq: Every day | ORAL | Status: DC
  Administered 2017-03-12 – 2017-03-16 (×5): 10 mg via ORAL
  Filled 2017-03-12 (×5): qty 1

## 2017-03-12 MED ORDER — LORAZEPAM 2 MG/ML IJ SOLN
0.5000 mg | Freq: Once | INTRAMUSCULAR | Status: AC
  Administered 2017-03-12: 0.5 mg via INTRAVENOUS

## 2017-03-12 MED ORDER — LEVOFLOXACIN 500 MG PO TABS
500.0000 mg | ORAL_TABLET | Freq: Every day | ORAL | Status: DC
  Administered 2017-03-12 – 2017-03-13 (×2): 500 mg via ORAL
  Filled 2017-03-12 (×2): qty 1

## 2017-03-12 MED ORDER — MOMETASONE FURO-FORMOTEROL FUM 200-5 MCG/ACT IN AERO
2.0000 | INHALATION_SPRAY | Freq: Two times a day (BID) | RESPIRATORY_TRACT | Status: DC
  Administered 2017-03-12 – 2017-03-17 (×10): 2 via RESPIRATORY_TRACT
  Filled 2017-03-12: qty 8.8

## 2017-03-12 MED ORDER — FUROSEMIDE 10 MG/ML IJ SOLN: 60.0000 mg | Freq: Once | INTRAMUSCULAR | Status: DC

## 2017-03-12 MED ORDER — IPRATROPIUM-ALBUTEROL 0.5-2.5 (3) MG/3ML IN SOLN
3.0000 mL | Freq: Four times a day (QID) | RESPIRATORY_TRACT | Status: DC | PRN
Start: 2017-03-12 — End: 2017-03-12
  Administered 2017-03-12: 3 mL via RESPIRATORY_TRACT
  Filled 2017-03-12: qty 3

## 2017-03-12 MED ORDER — LORAZEPAM 2 MG/ML IJ SOLN
INTRAMUSCULAR | Status: AC
  Administered 2017-03-12: 0.5 mg via INTRAVENOUS
  Filled 2017-03-12: qty 1

## 2017-03-12 MED ORDER — CITALOPRAM HYDROBROMIDE 10 MG PO TABS
10.0000 mg | ORAL_TABLET | Freq: Every day | ORAL | Status: DC
  Administered 2017-03-12 – 2017-03-17 (×5): 10 mg via ORAL
  Filled 2017-03-12 (×5): qty 1

## 2017-03-12 MED ORDER — ONDANSETRON HCL 4 MG/2ML IJ SOLN: 4.0000 mg | Freq: Four times a day (QID) | INTRAMUSCULAR | Status: DC | PRN

## 2017-03-12 MED ORDER — ENOXAPARIN SODIUM 40 MG/0.4ML ~~LOC~~ SOLN
40.0000 mg | SUBCUTANEOUS | Status: DC
  Administered 2017-03-12 – 2017-03-15 (×4): 40 mg via SUBCUTANEOUS
  Filled 2017-03-12 (×5): qty 0.4

## 2017-03-12 MED ORDER — TIOTROPIUM BROMIDE MONOHYDRATE 18 MCG IN CAPS
1.0000 | ORAL_CAPSULE | Freq: Every day | RESPIRATORY_TRACT | Status: DC
  Administered 2017-03-12 – 2017-03-13 (×2): 18 ug via RESPIRATORY_TRACT
  Filled 2017-03-12: qty 5

## 2017-03-12 MED ORDER — SODIUM CHLORIDE 0.9% FLUSH
3.0000 mL | Freq: Two times a day (BID) | INTRAVENOUS | Status: DC
  Administered 2017-03-12 – 2017-03-16 (×9): 3 mL via INTRAVENOUS

## 2017-03-12 MED ORDER — ACETAMINOPHEN 325 MG PO TABS: 650.0000 mg | ORAL_TABLET | Freq: Four times a day (QID) | ORAL | Status: DC | PRN

## 2017-03-12 MED ORDER — FUROSEMIDE 10 MG/ML IJ SOLN
40.0000 mg | Freq: Once | INTRAMUSCULAR | Status: AC
  Administered 2017-03-12: 40 mg via INTRAVENOUS

## 2017-03-12 MED ORDER — ONDANSETRON HCL 4 MG PO TABS: 4.0000 mg | ORAL_TABLET | Freq: Four times a day (QID) | ORAL | Status: DC | PRN

## 2017-03-12 MED ORDER — FUROSEMIDE 10 MG/ML IJ SOLN
40.0000 mg | Freq: Two times a day (BID) | INTRAMUSCULAR | Status: DC
  Administered 2017-03-14: 40 mg via INTRAVENOUS
  Filled 2017-03-12 (×3): qty 4

## 2017-03-12 MED ORDER — ACETAMINOPHEN 650 MG RE SUPP: 650.0000 mg | Freq: Four times a day (QID) | RECTAL | Status: DC | PRN

## 2017-03-12 MED ORDER — VITAMIN D 1000 UNITS PO TABS
2000.0000 [IU] | ORAL_TABLET | Freq: Every day | ORAL | Status: DC
  Administered 2017-03-12 – 2017-03-17 (×6): 2000 [IU] via ORAL
  Filled 2017-03-12 (×6): qty 2

## 2017-03-12 MED ORDER — METHYLPREDNISOLONE SODIUM SUCC 125 MG IJ SOLR
60.0000 mg | Freq: Two times a day (BID) | INTRAMUSCULAR | Status: AC
  Administered 2017-03-12 – 2017-03-13 (×2): 60 mg via INTRAVENOUS
  Filled 2017-03-12 (×2): qty 2

## 2017-03-12 MED ORDER — FUROSEMIDE 10 MG/ML IJ SOLN
40.0000 mg | Freq: Once | INTRAMUSCULAR | Status: AC
  Administered 2017-03-12: 40 mg via INTRAVENOUS
  Filled 2017-03-12: qty 4

## 2017-03-12 MED ORDER — CARVEDILOL 3.125 MG PO TABS
3.1250 mg | ORAL_TABLET | Freq: Two times a day (BID) | ORAL | Status: DC
  Administered 2017-03-13 – 2017-03-17 (×7): 3.125 mg via ORAL
  Filled 2017-03-12 (×7): qty 1

## 2017-03-12 NOTE — Progress Notes (Signed)
Cross coverage  Paged by Nurse regarding patient being severely short of breath. Patient arrived from emergency room sometime back. When I arrived into the room patient was standing at side of the bed and leaning onto the bed. Patient extremely tachypneic. Getting a breathing treatment. Seems uncomfortable. Denies any chest pain. No cough. Minute for congestive heart failure with 30 pound weight gain. Received 1 dose of Lasix at 3:40 PM. Has blood pressure with systolic blood pressure 338.  S1 and S2 heard and tachycardic. Lungs clear. Some right basal crackles. Has significant pedal edema  * Acute hypoxic respiratory failure-worsening Gave 0.5 of IV Ativan. IV Lasix 40 mg. Check a stat ABG. Increase oxygen to 4 L. We'll transfer patient to stepdown. Will likely need BiPAP support if no improvement. Discussed with ICU team.  CC Time spent 35 minutes

## 2017-03-12 NOTE — Progress Notes (Signed)
Received  pt from ER with significant SOB on 2L oxygen by nasal cannula. Pt RR 32, HR 120, elevated BP. Standing at side of bed in tri-pod position and unable to get in bed. Dr. Juanna Cao paged and at bedside. Orders received for Lasix 40mg  IV, and 0.5mg  Ativan IV. Oxygen increased to 4L by nasal cannula. RT at bedside administering breathing treatment and ABG drawn. ICU staff at bedside assessing pt. Pt transferred to ICU at 2040.

## 2017-03-12 NOTE — ED Triage Notes (Signed)
Pt presents to ED from home via EMS c/o SOB x1 wk. Hx COPD, CHF, sickle cell anemia. Pt states she has had a cold for 1 week. Pt is normally on 2L n/c, states she usually has O2 sat around 90% at baseline d/t COPD. O2 on 3L during triage 91%.

## 2017-03-12 NOTE — ED Provider Notes (Signed)
[facility] Emergency Department Provider Note  ____________________________________________   I have reviewed the triage vital signs and the nursing notes.   HISTORY  Chief Complaint Shortness of Breath   History limited by: Not Limited   HPI Shannon Schneider is a 46 y.o. female who presents to the emergency department today because of concern for shortness of breath and increased fluid. The patient states that her shortness of breath started one week ago. It has got progressively worse. She did have some cough when it started. During the past week she has also noticed leg swelling and abdominal distention. She was instructed by her doctor to stop taking lasix roughly 3 weeks ago because of blood pressure concerns. She denies any fevers.   Past Medical History:  Diagnosis Date  . Congestive heart failure (CHF) ([place]) 06/27/15  . COPD (chronic obstructive pulmonary disease) ([place]) 06/27/15  . Hyperbilirubinemia   . 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 Sickle cell anemia [facility])     Patient Active Problem List   Diagnosis Date Noted  . Hyponatremia 12/30/2016  . Mass of perirectal soft tissue 06/22/2015    Past Surgical History:  Procedure Laterality Date  . CESAREAN SECTION  1989  . CHOLECYSTECTOMY  1996  . EUS N/A 07/13/2015   Procedure: LOWER ENDOSCOPIC ULTRASOUND (EUS);  Surgeon: Cora Daniels, MD;  Location: [facility] ENDOSCOPY;  Service: Endoscopy;  Laterality: N/A;    Prior to Admission medications   Medication Sig Start Date End Date Taking? Authorizing Provider  albuterol (PROVENTIL HFA;VENTOLIN HFA) 108 (90 Base) MCG/ACT inhaler Inhale 2 puffs into the lungs every 6 (six) hours as needed for wheezing. 09/16/16   Historical Provider, MD  budesonide-formoterol (SYMBICORT) 160-4.5 MCG/ACT inhaler Inhale 2 puffs into the lungs 2 (two) times daily. 01/02/17   Henreitta Leber, MD  carvedilol (COREG) 3.125 MG tablet Take 3.125 mg by mouth 2 (two) times daily with  a meal.     Historical Provider, MD  citalopram (CELEXA) 10 MG tablet Take 10 mg by mouth at bedtime.    Historical Provider, MD  ergocalciferol (VITAMIN D2) 50000 units capsule Take 50,000 Units by mouth once a week.    Historical Provider, MD  furosemide (LASIX) 40 MG tablet Take 1 tablet (40 mg total) by mouth daily as needed for fluid or edema. 01/02/17   Henreitta Leber, MD  loratadine (CLARITIN) 10 MG tablet Take 10 mg by mouth daily.    Historical Provider, MD  OXYGEN Inhale 2 L into the lungs continuous.     Historical Provider, MD  sacubitril-valsartan (ENTRESTO) 24-26 MG Take 1 tablet by mouth 2 (two) times daily.    Historical Provider, MD  Tiotropium Bromide Monohydrate (SPIRIVA RESPIMAT) 2.5 MCG/ACT AERS Inhale 1 puff into the lungs 2 (two) times daily. 05/13/16   Historical Provider, MD    Allergies Patient has no known allergies.  Family History  Problem Relation Age of Onset  . Renal Disease Mother   . Diabetes Mother   . Hypertension Mother   . Emphysema Father   . Breast cancer Neg Hx     Social History Social History  Substance Use Topics  . Smoking status: Current Every Day Smoker    Packs/day: 0.25    Years: 20.00    Types: Cigarettes  . Smokeless tobacco: Never Used  . Alcohol use 0.0 oz/week     Comment: occasionally    Review of Systems  Constitutional: Negative for fever. Cardiovascular: Negative for chest pain.  Respiratory: Positive for shortness of breath. Gastrointestinal: Negative for abdominal pain, vomiting and diarrhea. Genitourinary: Negative for dysuria. Musculoskeletal: Negative for back pain. Positive for lower leg swelling. Skin: Negative for rash. Neurological: Negative for headaches, focal weakness or numbness.  10-point ROS otherwise negative.  ____________________________________________   PHYSICAL EXAM:  VITAL SIGNS: ED Triage Vitals  Enc Vitals Group     BP 03/12/17 1258 96/76     Pulse Rate 03/12/17 1258 99     Resp  03/12/17 1258 14     Temp 03/12/17 1258 97.9 F (36.6 C)     Temp Source 03/12/17 1258 Oral     SpO2 03/12/17 1258 90 %     Weight 03/12/17 1300 161 lb (73 kg)     Height 03/12/17 1300 5' (1.524 m)   Constitutional: Alert and oriented. Well appearing and in no distress. Eyes: Conjunctivae are normal. Normal extraocular movements. ENT   Head: Normocephalic and atraumatic.   Nose: No congestion/rhinnorhea.   Mouth/Throat: Mucous membranes are moist.   Neck: No stridor. Hematological/Lymphatic/Immunilogical: No cervical lymphadenopathy. Cardiovascular: Normal rate, regular rhythm.  No murmurs, rubs, or gallops.  Respiratory: Slightly increased work of breathing. Crackles at bilateral bases.  Gastrointestinal: Soft and non tender. No rebound. No guarding.  Genitourinary: Deferred Musculoskeletal: Normal range of motion in all extremities. 2+ lower extremity pitting edema.  Neurologic:  Normal speech and language. No gross focal neurologic deficits are appreciated.  Skin:  Skin is warm, dry and intact. No rash noted. Psychiatric: Mood and affect are normal. Speech and behavior are normal. Patient exhibits appropriate insight and judgment.  ____________________________________________    LABS (pertinent positives/negatives)  Labs Reviewed  BASIC METABOLIC PANEL - Abnormal; Notable for the following:       Result Value   Sodium 134 (*)    Chloride 99 (*)    Glucose, Bld 112 (*)    All other components within normal limits  CBC - Abnormal; Notable for the following:    WBC 11.2 (*)    RBC 3.13 (*)    MCV 117.0 (*)    MCH 40.3 (*)    RDW 22.5 (*)    All other components within normal limits  TROPONIN I - Abnormal; Notable for the following:    Troponin I 0.05 (*)    All other components within normal limits  BRAIN NATRIURETIC PEPTIDE - Abnormal; Notable for the following:    B Natriuretic Peptide 1,525.0 (*)    All other components within normal limits      ____________________________________________   EKG  I, Nance Pear, attending physician, personally viewed and interpreted this EKG  EKG Time: 1328 Rate: 100 Rhythm: atrial flutter with 2:1 block Axis: normal Intervals: qtc 461 QRS: narrow ST changes: no st elevation Impression: abnormal ekg   ____________________________________________    RADIOLOGY  CXR IMPRESSION:  Mild CHF.      ____________________________________________   PROCEDURES  Procedures  CRITICAL CARE Performed by: Nance Pear   Total critical care time: 15 minutes  Critical care time was exclusive of separately billable procedures and treating other patients.  Critical care was necessary to treat or prevent imminent or life-threatening deterioration.  Critical care was time spent personally by me on the following activities: development of treatment plan with patient and/or surrogate as well as nursing, discussions with consultants, evaluation of patient's response to treatment, examination of patient, obtaining history from patient or surrogate, ordering and performing treatments and interventions, ordering and review of laboratory studies, ordering  and review of radiographic studies, pulse oximetry and re-evaluation of patient's condition.  ____________________________________________   INITIAL IMPRESSION / ASSESSMENT AND PLAN / ED COURSE  Pertinent labs & imaging results that were available during my care of the patient were reviewed by me and considered in my medical decision making (see chart for details).  To the emergency department today with concerns for worsening shortness breath and swelling for the past week. Patient with a history of CHF and has not been on Lasix for 3 weeks. On exam she does have crackles in the bilateral bases as well as leg swelling. Additionally patient's BNP was elevated and chest x-ray is consistent with mild CHF exacerbation. Patient denies  any chest pain and I think the mildly elevated troponin is likely secondary to CHF rather than acute coronary syndrome. This point will plan on giving patient IV Lasix and admission to the hospital service.  ____________________________________________   FINAL CLINICAL IMPRESSION(S) / ED DIAGNOSES  Final diagnoses:  Shortness of breath  Congestive heart failure, unspecified congestive heart failure chronicity, unspecified congestive heart failure type ([place])  Elevated troponin     Note: This dictation was prepared with Dragon dictation. Any transcriptional errors that result from this process are unintentional     Nance Pear, MD 03/12/17 1536

## 2017-03-12 NOTE — Progress Notes (Signed)
Pharmacy Antibiotic Note  Shannon Schneider is a 46 y.o. female admitted on 03/12/2017 with Bronchitis.  Pharmacy has been consulted for Levofloxacin dosing.  Plan: Will start patient on levofloxacin 500mg  daily.  Recommend checking a procalcitonin level - if <0.10 considered discontinuation of antibiotics.   Height: 5' (152.4 cm) Weight: 161 lb (73 kg) IBW/kg (Calculated) : 45.5  Temp (24hrs), Avg:97.9 F (36.6 C), Min:97.9 F (36.6 C), Max:97.9 F (36.6 C)   Recent Labs Lab 03/12/17 1314  WBC 11.2*  CREATININE 0.51    Estimated Creatinine Clearance: 78.4 mL/min (by C-G formula based on SCr of 0.51 mg/dL).    No Known Allergies  Antimicrobials this admission: 3/14 levofloxacin  >>   Dose adjustments this admission:  Microbiology results:  Thank you for allowing pharmacy to be a part of this patient's care.  Pernell Dupre, PharmD, BCPS Clinical Pharmacist 03/12/2017 4:22 PM

## 2017-03-12 NOTE — H&P (Addendum)
[facility] [place] NAME: Shannon Schneider    MR#:  [id number]  DATE OF BIRTH:  1971/01/05  DATE OF ADMISSION:  03/12/2017  PRIMARY CARE PHYSICIAN: Kasandra Knudsen, NP   REQUESTING/REFERRING PHYSICIAN: Dr. Nance Pear  CHIEF COMPLAINT:   Chief Complaint  Patient presents with  . Shortness of Breath    HISTORY OF PRESENT ILLNESS:  Shannon Schneider  is a 46 y.o. female with a known history of Sickle cell disease, with minimal pain crisis, COPD with 2 L home oxygen, combined congestive heart failure presents to the hospital secondary to worsening shortness of breath for almost a week.  Symptoms started about a week ago with cold and sinus symptoms. She is coughing up greenish thick productive phlegm at this time. Denies any fevers or chills. Her dyspnea has been worsening over the last 3-4 days. Currently has trouble laying flat. She is supposed to get a CT abdomen for her rectal mass that she is being followed up by a surgeon, however was unable to lay flat on the table and so presented to the emergency room. Chest x-ray here showing congestive heart failure. BNP is elevated. Denies any nausea, vomiting. No other symptoms. Lower extremities have been swollen for the last week.  PAST MEDICAL HISTORY:   Past Medical History:  Diagnosis Date  . Congestive heart failure (CHF) ([place]) 06/27/15  . COPD (chronic obstructive pulmonary disease) ([place]) 06/27/15  . Hyperbilirubinemia   . 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 Rectal mass   . Sickle cell anemia ([facility])     PAST SURGICAL HISTORY:   Past Surgical History:  Procedure Laterality Date  . CESAREAN SECTION  1989  . CHOLECYSTECTOMY  1996  . EUS N/A 07/13/2015   Procedure: LOWER ENDOSCOPIC ULTRASOUND (EUS);  Surgeon: Cora Daniels, MD;  Location: [facility] ENDOSCOPY;  Service: Endoscopy;  Laterality: N/A;    SOCIAL HISTORY:   Social History  Substance Use Topics  . Smoking status: Current Every Day  Smoker    Packs/day: 0.25    Years: 20.00    Types: Cigarettes  . Smokeless tobacco: Never Used  . Alcohol use 0.0 oz/week     Comment: occasionally    FAMILY HISTORY:   Family History  Problem Relation Age of Onset  . Renal Disease Mother   . Diabetes Mother   . Hypertension Mother   . Emphysema Father   . Breast cancer Neg Hx     DRUG ALLERGIES:  No Known Allergies  REVIEW OF SYSTEMS:   Review of Systems  Constitutional: Positive for malaise/fatigue. Negative for chills, fever and weight loss.  HENT: Negative for ear discharge, ear pain, hearing loss and nosebleeds.   Eyes: Negative for blurred vision, double vision and photophobia.  Respiratory: Positive for cough and shortness of breath. Negative for hemoptysis and wheezing.   Cardiovascular: Positive for leg swelling. Negative for chest pain, palpitations and orthopnea.  Gastrointestinal: Negative for abdominal pain, constipation, diarrhea, melena, nausea and vomiting.  Genitourinary: Negative for dysuria and urgency.  Musculoskeletal: Negative for back pain, myalgias and neck pain.  Skin: Negative for rash.  Neurological: Negative for dizziness, sensory change, speech change, focal weakness and headaches.  Endo/Heme/Allergies: Does not bruise/bleed easily.  Psychiatric/Behavioral: Negative for depression.    MEDICATIONS AT HOME:   Prior to Admission medications   Medication Sig Start Date End Date Taking? Authorizing Provider  albuterol (PROVENTIL HFA;VENTOLIN HFA) 108 (90 Base) MCG/ACT inhaler Inhale 2 puffs into the  lungs every 6 (six) hours as needed for wheezing. 09/16/16  Yes Historical Provider, MD  budesonide-formoterol (SYMBICORT) 160-4.5 MCG/ACT inhaler Inhale 2 puffs into the lungs 2 (two) times daily. 01/02/17  Yes Henreitta Leber, MD  carvedilol (COREG) 3.125 MG tablet Take 3.125 mg by mouth 2 (two) times daily with a meal.    Yes Historical Provider, MD  citalopram (CELEXA) 10 MG tablet Take 10 mg by  mouth at bedtime.   Yes Historical Provider, MD  Ergocalciferol 2000 units TABS Take 2,000 Units by mouth daily.    Yes Historical Provider, MD  loratadine (CLARITIN) 10 MG tablet Take 10 mg by mouth daily.   Yes Historical Provider, MD  sacubitril-valsartan (ENTRESTO) 24-26 MG Take 0.5 tablets by mouth 2 (two) times daily.    Yes Historical Provider, MD  Tiotropium Bromide Monohydrate (SPIRIVA RESPIMAT) 2.5 MCG/ACT AERS Inhale 1 puff into the lungs 2 (two) times daily. 05/13/16  Yes Historical Provider, MD  furosemide (LASIX) 40 MG tablet Take 1 tablet (40 mg total) by mouth daily as needed for fluid or edema. Patient not taking: Reported on 03/12/2017 01/02/17   Henreitta Leber, MD  OXYGEN Inhale 2 L into the lungs continuous.     Historical Provider, MD      VITAL SIGNS:  Blood pressure 101/68, pulse (!) 106, temperature 97.9 F (36.6 C), temperature source Oral, resp. rate 19, height 5' (1.524 m), weight 73 kg (161 lb), last menstrual period 01/12/2017, SpO2 90 %.  PHYSICAL EXAMINATION:   Physical Exam  GENERAL:  46 y.o.-year-old patient lying in the bed with no acute distress.  EYES: Pupils equal, round, reactive to light and accommodation. positive scleral icterus. Extraocular muscles intact.  HEENT: Head atraumatic, normocephalic. Oropharynx and nasopharynx clear.  NECK:  Supple, no jugular venous distention. No thyroid enlargement, no tenderness.  LUNGS: Normal breath sounds bilaterally, no wheezing, bibasilar crackles posteriorly. No use of accessory muscles of respiration.  CARDIOVASCULAR: S1, S2 normal. No murmurs, rubs, or gallops.  ABDOMEN: Soft, nontender, nondistended. Bowel sounds present. No organomegaly or mass.  EXTREMITIES: No cyanosis, or clubbing. 2+ pedal edema bilaterally NEUROLOGIC: Cranial nerves II through XII are intact. Muscle strength 5/5 in all extremities. Sensation intact. Gait not checked.  PSYCHIATRIC: The patient is alert and oriented x 3.  SKIN: No  obvious rash, lesion, or ulcer.   LABORATORY PANEL:   CBC  Recent Labs Lab 03/12/17 1314  WBC 11.2*  HGB 12.6  HCT 36.6  PLT 302   ------------------------------------------------------------------------------------------------------------------  Chemistries   Recent Labs Lab 03/12/17 1314  NA 134*  K 5.0  CL 99*  CO2 26  GLUCOSE 112*  BUN 20  CREATININE 0.51  CALCIUM 9.2   ------------------------------------------------------------------------------------------------------------------  Cardiac Enzymes  Recent Labs Lab 03/12/17 1314  TROPONINI 0.05*   ------------------------------------------------------------------------------------------------------------------  RADIOLOGY:  Dg Chest 2 View  Result Date: 03/12/2017 CLINICAL DATA:  Shortness of breath EXAM: CHEST  2 VIEW COMPARISON:  01/01/2017 FINDINGS: Marked cardiopericardial enlargement, stable. There are [employer] and fissure thickening. Trace pleural fluid. No air bronchogram. IMPRESSION: Mild CHF. Electronically Signed   By: Monte Fantasia M.D.   On: 03/12/2017 14:24    EKG:   Orders placed or performed during the hospital encounter of 03/12/17  . ED EKG  . ED EKG    IMPRESSION AND PLAN:   Shannon Schneider  is a 46 y.o. female with a known history of Sickle cell disease, with minimal pain crisis, COPD with 2 L home oxygen,  combined congestive heart failure presents to the hospital secondary to worsening shortness of breath for almost a week.   #1 acute on chronic combined CHF exacerbation-prior cardiac catheterization according to patient with nonobstructive disease, she has not ischemic cardiomyopathy. -Recent echocardiogram 2 months ago showing normal EF of 50% but elevated pulmonary artery pressures and LVH. -Started on IV Lasix twice a day. Cardiology consult. No plans to repeat echocardiogram at this time. -Chest x-ray with no pneumonia but patient having greenish productive phlegm and  bronchitis symptoms. Will start oral Levaquin. - daily weights, salt restricted diet  #2 COPD on 2 L home oxygen-no active wheezing noted. Continue inhalers. On Levaquin for bronchitis symptoms. Nebs when necessary. continue oxygen support.  #3 sickle cell disease-follows with hematology at [facility]. Very stable. Not on maintenance hydroxyurea due to very few pain crisis. Hemoglobin is stable at this time. Check bilirubin as appears jaundiced  #4 soft tissue periductal mass-following up with surgeon as outpatient for possible biopsy and resection.  #5 DVT prophylaxis-on Lovenox   All the records are reviewed and case discussed with ED provider. Management plans discussed with the patient, family and they are in agreement.  CODE STATUS: Full Code  TOTAL TIME TAKING CARE OF THIS PATIENT: 50 minutes.    Gladstone Lighter M.D on 03/12/2017 at 4:08 PM  Between 7am to 6pm - Pager - [phone]  After 6pm go to [email or web address] - Proofreader  [facility]  Office  [phone]  CC: Primary care physician; Kasandra Knudsen, NP

## 2017-03-12 NOTE — ED Notes (Signed)
Transported pt to floor. Stats were good. Patient alert and oriented. Patient refused to walk into hospital room but agreed to slide over. During transfer to the bed patient became anxious and said the room was too hot. Patient was hooked up to 4 liters O2. Patient was in the middle of the bed when she got up on her knees and grabbed her family member in the room hugging him stating I can't breath and the room was too hot. I still had hands on the patient so she wouldn't fall. After staff came into assist I moved the stretcher and moved the bed to lowest position. Patient was with staff refusing to get off her knees and lay in the bed.

## 2017-03-12 NOTE — Consult Note (Signed)
PULMONARY / CRITICAL CARE MEDICINE   Name: LYNCOLN MASKELL MRN: [id number] DOB: [birth year]    ADMISSION DATE:  03/12/2017 CONSULTATION DATE:  03/12/17  REFERRING MD:  Dr. Hillary Bow  CHIEF COMPLAINT:  Severe shortness of breath  HISTORY OF PRESENT ILLNESS:   Shanya Ferriss is a 46 yo female with CHF,COPD on 2L O2, Sickle Cell Disease with minimal pain crisis.  Patient presented to [facility] with shortness of breath and was admitted by the Hospitalist team for Acute on Chronic CHF exacerbation.  Patient was transferred from the telemetry floor to the ICU due to the fact that she became extremely tachypnic and short of breath.  Patient received some ativan and bronchodilator treatment.  She also received 40 mg lasix X1.  Patient appears to be in moderate distress and will need Bi PAP. She will be monitored in the ICU tonight.  PAST MEDICAL HISTORY :  She  has a past medical history of Congestive heart failure (CHF) ([place]) (06/27/15); COPD (chronic obstructive pulmonary disease) ([place]) (06/27/15); Hyperbilirubinemia; Oxygen dependent (06/27/15); Rectal mass; and Sickle cell anemia ([place]).  PAST SURGICAL HISTORY: She  has a past surgical history that includes Cesarean section (1989); Cholecystectomy (1996); and EUS (N/A, 07/13/2015).  No Known Allergies  No current facility-administered medications on file prior to encounter.    Current Outpatient Prescriptions on File Prior to Encounter  Medication Sig  . albuterol (PROVENTIL HFA;VENTOLIN HFA) 108 (90 Base) MCG/ACT inhaler Inhale 2 puffs into the lungs every 6 (six) hours as needed for wheezing.  . budesonide-formoterol (SYMBICORT) 160-4.5 MCG/ACT inhaler Inhale 2 puffs into the lungs 2 (two) times daily.  . carvedilol (COREG) 3.125 MG tablet Take 3.125 mg by mouth 2 (two) times daily with a meal.   . citalopram (CELEXA) 10 MG tablet Take 10 mg by mouth at bedtime.  . Ergocalciferol 2000 units TABS Take 2,000 Units by mouth daily.   Marland Kitchen loratadine  (CLARITIN) 10 MG tablet Take 10 mg by mouth daily.  . sacubitril-valsartan (ENTRESTO) 24-26 MG Take 0.5 tablets by mouth 2 (two) times daily.   . Tiotropium Bromide Monohydrate (SPIRIVA RESPIMAT) 2.5 MCG/ACT AERS Inhale 1 puff into the lungs 2 (two) times daily.  . furosemide (LASIX) 40 MG tablet Take 1 tablet (40 mg total) by mouth daily as needed for fluid or edema. (Patient not taking: Reported on 03/12/2017)  . OXYGEN Inhale 2 L into the lungs continuous.     FAMILY HISTORY:  Her indicated that her mother is deceased. She indicated that her father is deceased. She indicated that the status of her neg hx is unknown.    SOCIAL HISTORY: She  reports that she has been smoking Cigarettes.  She has a 5.00 pack-year smoking history. She has never used smokeless tobacco. She reports that she drinks alcohol. She reports that she does not use drugs.  REVIEW OF SYSTEMS:   Unable to obtain as the patient is dyspnic  SUBJECTIVE:  Unable to obtain as the patient is dyspnic  VITAL SIGNS: BP (!) 151/115 (BP Location: Right Arm)   Pulse (!) 120   Temp 97.9 F (36.6 C) (Oral)   Resp (!) 24   Ht 5' (1.524 m)   Wt 73 kg (161 lb)   LMP 01/12/2017   SpO2 90%   BMI 31.44 kg/m   HEMODYNAMICS:    VENTILATOR SETTINGS:    INTAKE / OUTPUT: No intake/output data recorded.  PHYSICAL EXAMINATION: General:  AA female, in moderate distress Neuro: Awake, Alert, Oriented,  follows command HEENT: AT,[place], JVD present,PERRLA Cardiovascular:  S1S2, regular, no wheezes, crackles, rhonchi noted Lungs:  Bibasilar crackles and faint expiratory wheezes noted Abdomen:  Soft,NT,ND Musculoskeletal:  +3 Pedal edema, bilaterally, Active ROM Skin: Warm, dry and intact.  LABS:  BMET  Recent Labs Lab 03/12/17 1314  NA 134*  K 5.0  CL 99*  CO2 26  BUN 20  CREATININE 0.51  GLUCOSE 112*    Electrolytes  Recent Labs Lab 03/12/17 1314  CALCIUM 9.2    CBC  Recent Labs Lab 03/12/17 1314  WBC  11.2*  HGB 12.6  HCT 36.6  PLT 302    Coag's No results for input(s): APTT, INR in the last 168 hours.  Sepsis Markers No results for input(s): LATICACIDVEN, PROCALCITON, O2SATVEN in the last 168 hours.  ABG  Recent Labs Lab 03/12/17 1956  PHART 7.32*  PCO2ART 40  PO2ART 61*    Liver Enzymes  Recent Labs Lab 03/12/17 1314  AST 24  ALT 11*  ALKPHOS 135*  BILITOT 9.4*  ALBUMIN 3.6    Cardiac Enzymes  Recent Labs Lab 03/12/17 1314  TROPONINI 0.05*    Glucose No results for input(s): GLUCAP in the last 168 hours.  Imaging Dg Chest 2 View  Result Date: 03/12/2017 CLINICAL DATA:  Shortness of breath EXAM: CHEST  2 VIEW COMPARISON:  01/01/2017 FINDINGS: Marked cardiopericardial enlargement, stable. There are [employer] and fissure thickening. Trace pleural fluid. No air bronchogram. IMPRESSION: Mild CHF. Electronically Signed   By: Monte Fantasia M.D.   On: 03/12/2017 14:24     STUDIES:  12/31/16 ECHO>>The right ventricular systolic pressure was increased consistent  with severe pulmonary hypertension. Mild left ventricular  systolic dysfunction with left ventricular ejection fraction 50%  thickened redundant mitral valve with eccentric mild to moderate  mitral regurgitation and mild aortic regurgitation and severe  tricuspid regurgitation with severe pulmonary hypertension and  D-shaped septum of the left ventricle.   CULTURES: None  ANTIBIOTICS: 3/14 Levofloxacin>>  SIGNIFICANT EVENTS: 3/14 Patient admitted to [facility] With CHF Exacerbation 3/14 Patient transferred to The ICU for Acute on Chronic Respiratory failure due to CHF Exacerbation requiring BiPAP.  LINES/TUBES: None   ASSESSMENT / PLAN:  PULMONARY A: Acute On chronic Respiratory Failure  Related to CHF/COPD exacerabation COPD  Sickle Cell Anemia Elevated pulmonary artery pressures and LVH P:   Continue Bipap, wean to home O2 Bronchodilator Solumedrol, Taper Levaquin per primary due  to greenish productive sputum  CARDIOVASCULAR A:  Acute on Chronic CHF Exacerbation Elevated troponins due to demand Ischemia P:  Continuous telemetry Continue Lasix  Cardiology consulted Trend BNP  RENAL A:   Hyponatremia related to volume overload P:   Replace electrolytes per usual guidelines Follow BMET  GASTROINTESTINAL A:   Soft tissue periductal mass P:   following up with surgeon as outpatient for possible biopsy and resection.  HEMATOLOGIC A:   Sickle cell disease P:  -follows with hematology at [facility]. Very stable - Not on maintenance hydroxyurea due to very few pain crisis.Hemoglobin is stable at this time. Check bilirubin as appears jaundiced - Lovenox for DVT prophylaxis INFECTIOUS A:   Leukocytosis P:   Monitor fever, CBC  ENDOCRINE A:   No active issues  P:   Blood glucose checks with BMP  NEUROLOGIC A:   No active issues P:    Provide Supportive care  FAMILY  - Updates: No family present at the bedside.  -   Bincy Varughese,AG-ACNP Pulmonary and Critical Care Medicine  [employer]   03/12/2017, 8:23 PM

## 2017-03-13 ENCOUNTER — Inpatient Hospital Stay
Admit: 2017-03-13 | Discharge: 2017-03-13 | Disposition: A | Discharge status: Home / Self Care | Payer: BLUE CROSS/BLUE SHIELD | Attending: Cardiovascular Disease | Admitting: Cardiovascular Disease

## 2017-03-13 DIAGNOSIS — R7989 Other specified abnormal findings of blood chemistry: Secondary | ICD-10-CM

## 2017-03-13 DIAGNOSIS — R778 Other specified abnormalities of plasma proteins: Secondary | ICD-10-CM

## 2017-03-13 LAB — CBC
HEMATOCRIT: 35.1 % (ref 35.0–47.0)
HEMOGLOBIN: 12.1 g/dL (ref 12.0–16.0)
MCH: 39.8 pg — ABNORMAL HIGH (ref 26.0–34.0)
MCHC: 34.5 g/dL (ref 32.0–36.0)
MCV: 115.5 fL — ABNORMAL HIGH (ref 80.0–100.0)
Platelets: 335 10*3/uL (ref 150–440)
RBC: 3.04 MIL/uL — AB (ref 3.80–5.20)
RDW: 22.1 % — AB (ref 11.5–14.5)
WBC: 9.7 10*3/uL (ref 3.6–11.0)

## 2017-03-13 LAB — BASIC METABOLIC PANEL
ANION GAP: 9 (ref 5–15)
BUN: 20 mg/dL (ref 6–20)
CO2: 23 mmol/L (ref 22–32)
Calcium: 8.7 mg/dL — ABNORMAL LOW (ref 8.9–10.3)
Chloride: 101 mmol/L (ref 101–111)
Creatinine, Ser: 0.68 mg/dL (ref 0.44–1.00)
Glucose, Bld: 116 mg/dL — ABNORMAL HIGH (ref 65–99)
Potassium: 4.3 mmol/L (ref 3.5–5.1)
SODIUM: 133 mmol/L — AB (ref 135–145)

## 2017-03-13 LAB — TROPONIN I
TROPONIN I: 0.06 ng/mL — AB (ref <0.03)
TROPONIN I: 0.05 ng/mL — AB (ref <0.03)

## 2017-03-13 LAB — PATHOLOGIST SMEAR REVIEW

## 2017-03-13 LAB — PROCALCITONIN: PROCALCITONIN: 0.21 ng/mL

## 2017-03-13 MED ORDER — IPRATROPIUM-ALBUTEROL 0.5-2.5 (3) MG/3ML IN SOLN
3.0000 mL | RESPIRATORY_TRACT | Status: DC
  Administered 2017-03-13 – 2017-03-16 (×20): 3 mL via RESPIRATORY_TRACT
  Filled 2017-03-13 (×20): qty 3

## 2017-03-13 MED ORDER — ORAL CARE MOUTH RINSE
15.0000 mL | Freq: Two times a day (BID) | OROMUCOSAL | Status: DC
  Administered 2017-03-13 – 2017-03-16 (×7): 15 mL via OROMUCOSAL

## 2017-03-13 MED ORDER — CHLORHEXIDINE GLUCONATE 0.12 % MT SOLN
15.0000 mL | Freq: Two times a day (BID) | OROMUCOSAL | Status: DC
  Administered 2017-03-13 – 2017-03-17 (×8): 15 mL via OROMUCOSAL
  Filled 2017-03-13 (×8): qty 15

## 2017-03-13 MED ORDER — BUDESONIDE 0.5 MG/2ML IN SUSP
0.5000 mg | Freq: Two times a day (BID) | RESPIRATORY_TRACT | Status: DC
  Administered 2017-03-13 – 2017-03-17 (×9): 0.5 mg via RESPIRATORY_TRACT
  Filled 2017-03-13 (×9): qty 2

## 2017-03-13 MED ORDER — TIOTROPIUM BROMIDE MONOHYDRATE 18 MCG IN CAPS
1.0000 | ORAL_CAPSULE | Freq: Every day | RESPIRATORY_TRACT | Status: DC
  Administered 2017-03-15 – 2017-03-17 (×3): 18 ug via RESPIRATORY_TRACT
  Filled 2017-03-13: qty 5

## 2017-03-13 MED ORDER — METHYLPREDNISOLONE SODIUM SUCC 40 MG IJ SOLR
40.0000 mg | Freq: Two times a day (BID) | INTRAMUSCULAR | Status: DC
  Administered 2017-03-13 – 2017-03-15 (×4): 40 mg via INTRAVENOUS
  Filled 2017-03-13 (×4): qty 1

## 2017-03-13 NOTE — Progress Notes (Signed)
Chaplain was making rounds and visited with pt in [place]. Patient welcomed the chaplain and spoke briefly about her heart and breathing issues. Pt was using her faith to cope with her illness. Pt communicated that she has a lot to be thankful for. Provided the ministry of prayer and a pastoral presence.   03/13/17 1400  Clinical Encounter Type  Visited With Patient;Patient and family together  Visit Type Initial  Referral From Nurse  Consult/Referral To Chaplain  Spiritual Encounters  Spiritual Needs Prayer

## 2017-03-13 NOTE — Care Management (Signed)
Patient admitted from home with congestive heart failure.  Has chronic home oxygen through [facility].  Currently has no services in the home.  She is followed at [facility] for her sickle cell.  Referral to HF Clinic.  Heart failure is not a new diagnosis for patient.  Able to perform her adls but does get tired. No issues accessing medical care or paying for medications. Current with pcp. Was placed in icu under floorcare due to limited staffing on 2A when admittedf

## 2017-03-13 NOTE — Progress Notes (Signed)
Shannon Schneider is a 46 y.o. female  [id number]  Primary Cardiologist: Neoma Laming Reason for Consultation: CHF  HPI: This is a 46 year old African-American female with a history of COPD congestive heart failure presented to the hospital with extreme shortness of breath orthopnea PND and leg swelling along with ascites. No chest pain.   Review of Systems: Patient does have orthopnea PND and leg swelling and no chest pain   Past Medical History:  Diagnosis Date  . Congestive heart failure (CHF) ([place]) 06/27/15  . COPD (chronic obstructive pulmonary disease) ([place]) 06/27/15  . Hyperbilirubinemia   . 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 Rectal mass   . Sickle cell anemia ([facility])     Medications Prior to Admission  Medication Sig Dispense Refill  . albuterol (PROVENTIL HFA;VENTOLIN HFA) 108 (90 Base) MCG/ACT inhaler Inhale 2 puffs into the lungs every 6 (six) hours as needed for wheezing.    . budesonide-formoterol (SYMBICORT) 160-4.5 MCG/ACT inhaler Inhale 2 puffs into the lungs 2 (two) times daily. 1 Inhaler 12  . carvedilol (COREG) 3.125 MG tablet Take 3.125 mg by mouth 2 (two) times daily with a meal.     . citalopram (CELEXA) 10 MG tablet Take 10 mg by mouth at bedtime.    . Ergocalciferol 2000 units TABS Take 2,000 Units by mouth daily.     Marland Kitchen loratadine (CLARITIN) 10 MG tablet Take 10 mg by mouth daily.    . sacubitril-valsartan (ENTRESTO) 24-26 MG Take 0.5 tablets by mouth 2 (two) times daily.     . Tiotropium Bromide Monohydrate (SPIRIVA RESPIMAT) 2.5 MCG/ACT AERS Inhale 1 puff into the lungs 2 (two) times daily.    . furosemide (LASIX) 40 MG tablet Take 1 tablet (40 mg total) by mouth daily as needed for fluid or edema. (Patient not taking: Reported on 03/12/2017) 30 tablet   . OXYGEN Inhale 2 L into the lungs continuous.        . budesonide (PULMICORT) nebulizer solution  0.5 mg Nebulization BID  . carvedilol  3.125 mg Oral BID WC  . chlorhexidine  15 mL Mouth Rinse BID  .  cholecalciferol  2,000 Units Oral Daily  . citalopram  10 mg Oral QHS  . enoxaparin (LOVENOX) injection  40 mg Subcutaneous Q24H  . furosemide  40 mg Intravenous Q12H  . ipratropium-albuterol  3 mL Nebulization Q4H  . levofloxacin  500 mg Oral Daily  . loratadine  10 mg Oral Daily  . mouth rinse  15 mL Mouth Rinse q12n4p  . methylPREDNISolone (SOLU-MEDROL) injection  40 mg Intravenous Q12H  . mometasone-formoterol  2 puff Inhalation BID  . sodium chloride flush  3 mL Intravenous Q12H  . tiotropium  1 capsule Inhalation Daily    Infusions:   No Known Allergies  Social History   Social History  . Marital status: Married    Spouse name: N/A  . Number of children: N/A  . Years of education: N/A   Occupational History  . Not on file.   Social History Main Topics  . Smoking status: Current Every Day Smoker    Packs/day: 0.25    Years: 20.00    Types: Cigarettes  . Smokeless tobacco: Never Used  . Alcohol use 0.0 oz/week     Comment: occasionally  . Drug use: No  . Sexual activity: Not on file   Other Topics Concern  . Not on file   Social History Narrative   Independent at baseline    Family History  Problem Relation Age of Onset  . Renal Disease Mother   . Diabetes Mother   . Hypertension Mother   . Emphysema Father   . Breast cancer Neg Hx     PHYSICAL EXAM: Vitals:   03/13/17 0730 03/13/17 0746  BP: (!) 78/53 (!) 81/64  Pulse: 96 (!) 107  Resp: 17 (!) 21  Temp:  98.2 F (36.8 C)     Intake/Output Summary (Last 24 hours) at 03/13/17 0903 Last data filed at 03/13/17 0800  Gross per 24 hour  Intake                0 ml  Output              940 ml  Net             -940 ml    General:  Well appearing. No respiratory difficulty HEENT: normal Neck: supple. no JVD. Carotids 2+ bilat; no bruits. No lymphadenopathy or thryomegaly appreciated. Cor: PMI nondisplaced. Regular rate & rhythm. No rubs, gallops or murmurs. Lungs: clear Abdomen: soft,  nontender, nondistended. No hepatosplenomegaly. No bruits or masses. Good bowel sounds. Extremities: no cyanosis, clubbing, rash, edema Neuro: alert & oriented x 3, cranial nerves grossly intact. moves all 4 extremities w/o difficulty. Affect pleasant.  MGN:OIBBCW flutter with right bundle branch block nonspecific ST-T changes  Results for orders placed or performed during the hospital encounter of 03/12/17 (from the past 24 hour(s))  Basic metabolic panel     Status: Abnormal   Collection Time: 03/12/17  1:14 PM  Result Value Ref Range   Sodium 134 (L) 135 - 145 mmol/L   Potassium 5.0 3.5 - 5.1 mmol/L   Chloride 99 (L) 101 - 111 mmol/L   CO2 26 22 - 32 mmol/L   Glucose, Bld 112 (H) 65 - 99 mg/dL   BUN 20 6 - 20 mg/dL   Creatinine, Ser 0.51 0.44 - 1.00 mg/dL   Calcium 9.2 8.9 - 10.3 mg/dL   GFR calc non Af Amer >60 >60 mL/min   GFR calc Af Amer >60 >60 mL/min   Anion gap 9 5 - 15  CBC     Status: Abnormal   Collection Time: 03/12/17  1:14 PM  Result Value Ref Range   WBC 11.2 (H) 3.6 - 11.0 K/uL   RBC 3.13 (L) 3.80 - 5.20 MIL/uL   Hemoglobin 12.6 12.0 - 16.0 g/dL   HCT 36.6 35.0 - 47.0 %   MCV 117.0 (H) 80.0 - 100.0 fL   MCH 40.3 (H) 26.0 - 34.0 pg   MCHC 34.5 32.0 - 36.0 g/dL   RDW 22.5 (H) 11.5 - 14.5 %   Platelets 302 150 - 440 K/uL  Troponin I     Status: Abnormal   Collection Time: 03/12/17  1:14 PM  Result Value Ref Range   Troponin I 0.05 (HH) <0.03 ng/mL  Hepatic function panel     Status: Abnormal   Collection Time: 03/12/17  1:14 PM  Result Value Ref Range   Total Protein 6.9 6.5 - 8.1 g/dL   Albumin 3.6 3.5 - 5.0 g/dL   AST 24 15 - 41 U/L   ALT 11 (L) 14 - 54 U/L   Alkaline Phosphatase 135 (H) 38 - 126 U/L   Total Bilirubin 9.4 (H) 0.3 - 1.2 mg/dL   Bilirubin, Direct 3.7 (H) 0.1 - 0.5 mg/dL   Indirect Bilirubin 5.7 (H) 0.3 - 0.9 mg/dL  Brain natriuretic peptide  Status: Abnormal   Collection Time: 03/12/17  1:15 PM  Result Value Ref Range   B  Natriuretic Peptide 1,525.0 (H) 0.0 - 100.0 pg/mL  Blood gas, arterial     Status: Abnormal   Collection Time: 03/12/17  7:56 PM  Result Value Ref Range   FIO2 0.44    Delivery systems NASAL CANNULA    pH, Arterial 7.32 (L) 7.350 - 7.450   pCO2 arterial 40 32.0 - 48.0 mmHg   pO2, Arterial 61 (L) 83.0 - 108.0 mmHg   Bicarbonate 20.6 20.0 - 28.0 mmol/L   Acid-base deficit 5.2 (H) 0.0 - 2.0 mmol/L   O2 Saturation 88.8 %   Patient temperature 37.0    Collection site RIGHT RADIAL    Sample type ARTERIAL DRAW    Allens test (pass/fail) PASS PASS  Troponin I     Status: Abnormal   Collection Time: 03/12/17  8:16 PM  Result Value Ref Range   Troponin I 0.06 (HH) <0.03 ng/mL  Glucose, capillary     Status: Abnormal   Collection Time: 03/12/17  8:33 PM  Result Value Ref Range   Glucose-Capillary 145 (H) 65 - 99 mg/dL  MRSA PCR Screening     Status: None   Collection Time: 03/12/17  8:47 PM  Result Value Ref Range   MRSA by PCR NEGATIVE NEGATIVE  CBC with Differential/Platelet     Status: Abnormal   Collection Time: 03/12/17  9:29 PM  Result Value Ref Range   WBC 11.9 (H) 3.6 - 11.0 K/uL   RBC 3.09 (L) 3.80 - 5.20 MIL/uL   Hemoglobin 12.4 12.0 - 16.0 g/dL   HCT 36.3 35.0 - 47.0 %   MCV 117.6 (H) 80.0 - 100.0 fL   MCH 40.4 (H) 26.0 - 34.0 pg   MCHC 34.3 32.0 - 36.0 g/dL   RDW 21.6 (H) 11.5 - 14.5 %   Platelets 320 150 - 440 K/uL   Neutrophils Relative % 78 %   Lymphocytes Relative 13 %   Monocytes Relative 6 %   Eosinophils Relative 0 %   Basophils Relative 1 %   Band Neutrophils 0 %   Metamyelocytes Relative 2 %   Myelocytes 0 %   Promyelocytes Absolute 0 %   Blasts 0 %   nRBC 40 (H) 0 /100 WBC   Other 0 %   Neutro Abs 9.6 (H) 1.4 - 6.5 K/uL   Lymphs Abs 1.5 1.0 - 3.6 K/uL   Monocytes Absolute 0.7 0.2 - 0.9 K/uL   Eosinophils Absolute 0.0 0 - 0.7 K/uL   Basophils Absolute 0.1 0 - 0.1 K/uL   RBC Morphology POLYCHROMASIA PRESENT    WBC Morphology TOXIC GRANULATION    Basic metabolic panel     Status: Abnormal   Collection Time: 03/13/17  1:20 AM  Result Value Ref Range   Sodium 133 (L) 135 - 145 mmol/L   Potassium 4.3 3.5 - 5.1 mmol/L   Chloride 101 101 - 111 mmol/L   CO2 23 22 - 32 mmol/L   Glucose, Bld 116 (H) 65 - 99 mg/dL   BUN 20 6 - 20 mg/dL   Creatinine, Ser 0.68 0.44 - 1.00 mg/dL   Calcium 8.7 (L) 8.9 - 10.3 mg/dL   GFR calc non Af Amer >60 >60 mL/min   GFR calc Af Amer >60 >60 mL/min   Anion gap 9 5 - 15  CBC     Status: Abnormal   Collection Time: 03/13/17  1:20 AM  Result Value Ref Range   WBC 9.7 3.6 - 11.0 K/uL   RBC 3.04 (L) 3.80 - 5.20 MIL/uL   Hemoglobin 12.1 12.0 - 16.0 g/dL   HCT 35.1 35.0 - 47.0 %   MCV 115.5 (H) 80.0 - 100.0 fL   MCH 39.8 (H) 26.0 - 34.0 pg   MCHC 34.5 32.0 - 36.0 g/dL   RDW 22.1 (H) 11.5 - 14.5 %   Platelets 335 150 - 440 K/uL  Troponin I     Status: Abnormal   Collection Time: 03/13/17  1:20 AM  Result Value Ref Range   Troponin I 0.06 (HH) <0.03 ng/mL  Troponin I     Status: Abnormal   Collection Time: 03/13/17  8:02 AM  Result Value Ref Range   Troponin I 0.05 (HH) <0.03 ng/mL   Dg Chest 2 View  Result Date: 03/12/2017 CLINICAL DATA:  Shortness of breath EXAM: CHEST  2 VIEW COMPARISON:  01/01/2017 FINDINGS: Marked cardiopericardial enlargement, stable. There are [employer] and fissure thickening. Trace pleural fluid. No air bronchogram. IMPRESSION: Mild CHF. Electronically Signed   By: Monte Fantasia M.D.   On: 03/12/2017 14:24     ASSESSMENT AND PLAN:Atrial flutter with very able block nonspecific ST-T changes on EKG/CHF. Will get echocardiogram to further evaluate treatment options. Continue Lasix for now.  Latica Hohmann A

## 2017-03-13 NOTE — Progress Notes (Signed)
Patient was placed on bipap upon arrival, currently on 4L nasal cannula, Spo2 in the 90's. Patient denied pain but had slight discomfort around back and buttocks. Nurse reposition patient several times and patient finally in comfortable position. Bp still remains soft, but NP is okay with bp as long as map is greater than 65. Currently resting in bed. Will continue to monitor patient.

## 2017-03-13 NOTE — Progress Notes (Addendum)
Patient ID: Shannon Schneider, female   DOB: Mar 08, 1971, 46 y.o.   MRN: [id number]  [facility] PROGRESS NOTE  JEANICE DEMPSEY SNK:[id number] DOB: 1971/09/10 DOA: 03/12/2017 PCP: Kasandra Knudsen, NP  HPI/Subjective: Patient thought she had a cold and was coughing up greenish phlegm. Came in unable to breathe. Patient stated she gained 30 pounds of weight in a week.  Objective: Vitals:   03/13/17 1500 03/13/17 1600  BP: 93/70 (!) 80/68  Pulse: (!) 135 (!) 111  Resp: (!) 28 (!) 25  Temp:  97.8 F (36.6 C)    Filed Weights   03/12/17 1300 03/12/17 2035 03/13/17 0454  Weight: 73 kg (161 lb) 76.5 kg (168 lb 10.4 oz) 73.5 kg (162 lb 0.6 oz)    ROS: Review of Systems  Constitutional: Negative for chills and fever.  Eyes: Negative for blurred vision.  Respiratory: Positive for cough, sputum production and shortness of breath.   Cardiovascular: Negative for chest pain.  Gastrointestinal: Negative for abdominal pain, constipation, diarrhea, nausea and vomiting.  Genitourinary: Negative for dysuria.  Musculoskeletal: Negative for joint pain.  Neurological: Negative for dizziness and headaches.   Exam: Physical Exam  Constitutional: She is oriented to person, place, and time.  HENT:  Nose: No mucosal edema.  Mouth/Throat: No oropharyngeal exudate or posterior oropharyngeal edema.  Eyes: Conjunctivae, EOM and lids are normal. Pupils are equal, round, and reactive to light.  Neck: No JVD present. Carotid bruit is not present. No edema present. No thyroid mass and no thyromegaly present.  Cardiovascular: S1 normal and S2 normal.  Tachycardia present.  Exam reveals no gallop.   No murmur heard. Pulses:      Dorsalis pedis pulses are 2+ on the right side, and 2+ on the left side.  Respiratory: No respiratory distress. She has decreased breath sounds in the right upper field, the right middle field, the right lower field, the left upper field, the left middle field and the left lower field. She  has wheezes in the right upper field and the left upper field. She has no rhonchi. She has rales in the right lower field and the left lower field.  GI: Soft. Bowel sounds are normal. She exhibits distension. There is no tenderness.  Musculoskeletal:       Right knee: She exhibits swelling.       Left knee: She exhibits swelling.       Right ankle: She exhibits swelling.       Left ankle: She exhibits swelling.  Lymphadenopathy:    She has no cervical adenopathy.  Neurological: She is alert and oriented to person, place, and time. No cranial nerve deficit.  Skin: Skin is warm. No rash noted. Nails show no clubbing.  Psychiatric: She has a normal mood and affect.      Data Reviewed: Basic Metabolic Panel:  Recent Labs Lab 03/12/17 1314 03/13/17 0120  NA 134* 133*  K 5.0 4.3  CL 99* 101  CO2 26 23  GLUCOSE 112* 116*  BUN 20 20  CREATININE 0.51 0.68  CALCIUM 9.2 8.7*   Liver Function Tests:  Recent Labs Lab 03/12/17 1314  AST 24  ALT 11*  ALKPHOS 135*  BILITOT 9.4*  PROT 6.9  ALBUMIN 3.6    CBC:  Recent Labs Lab 03/12/17 1314 03/12/17 2129 03/13/17 0120  WBC 11.2* 11.9* 9.7  NEUTROABS  --  9.6*  --   HGB 12.6 12.4 12.1  HCT 36.6 36.3 35.1  MCV 117.0* 117.6* 115.5*  PLT 302 320 335   Cardiac Enzymes:  Recent Labs Lab 03/12/17 1314 03/12/17 2016 03/13/17 0120 03/13/17 0802  TROPONINI 0.05* 0.06* 0.06* 0.05*   BNP (last 3 results)  Recent Labs  03/12/17 1315  BNP 1,525.0*     CBG:  Recent Labs Lab 03/12/17 2033  GLUCAP 145*    Recent Results (from the past 240 hour(s))  MRSA PCR Screening     Status: None   Collection Time: 03/12/17  8:47 PM  Result Value Ref Range Status   MRSA by PCR NEGATIVE NEGATIVE Final    Comment:        The GeneXpert MRSA Assay (FDA approved for NASAL specimens only), is one component of a comprehensive MRSA colonization surveillance program. It is not intended to diagnose MRSA infection nor to guide  or monitor treatment for MRSA infections.      Studies: Dg Chest 2 View  Result Date: 03/12/2017 CLINICAL DATA:  Shortness of breath EXAM: CHEST  2 VIEW COMPARISON:  01/01/2017 FINDINGS: Marked cardiopericardial enlargement, stable. There are [employer] and fissure thickening. Trace pleural fluid. No air bronchogram. IMPRESSION: Mild CHF. Electronically Signed   By: Monte Fantasia M.D.   On: 03/12/2017 14:24    Scheduled Meds: . budesonide (PULMICORT) nebulizer solution  0.5 mg Nebulization BID  . carvedilol  3.125 mg Oral BID WC  . chlorhexidine  15 mL Mouth Rinse BID  . cholecalciferol  2,000 Units Oral Daily  . citalopram  10 mg Oral QHS  . enoxaparin (LOVENOX) injection  40 mg Subcutaneous Q24H  . furosemide  40 mg Intravenous Q12H  . ipratropium-albuterol  3 mL Nebulization Q4H  . levofloxacin  500 mg Oral Daily  . loratadine  10 mg Oral Daily  . mouth rinse  15 mL Mouth Rinse q12n4p  . methylPREDNISolone (SOLU-MEDROL) injection  40 mg Intravenous Q12H  . mometasone-formoterol  2 puff Inhalation BID  . sodium chloride flush  3 mL Intravenous Q12H  . [START ON 03/15/2017] tiotropium  1 capsule Inhalation Daily    Assessment/Plan:  1. Acute hypoxic respiratory failure with hypoxia. Initially requiring BiPAP on presentation now down to 4 L nasal cannula. 2. Acute diastolic congestive heart failure with severe pulmonary hypertension and anasarca. Continue Lasix 40 mg IV twice a day and low-dose Coreg. 3. COPD exacerbation. Continue Solu-Medrol DuoNeb nebulizer solution and delirious inhaler. On Levaquin. 4. Toxic granulations seen on CBC. Unfortunately no blood culture sent on presentation. Get a sputum culture. Continue Levaquin. 5. History of sickle cell anemia 6. History of rectal mass 7. Tobacco abuse smoking cessation counseling done 3 minutes and 5 seconds by me  Code Status:     Code Status Orders        Start     Ordered   03/12/17 1933  Full code   Continuous     03/12/17 1932    Code Status History    Date Active Date Inactive Code Status Order ID Comments User Context   12/30/2016  4:15 PM 01/02/2017  7:09 PM Full Code [id number]  Hillary Bow, MD ED   06/27/2015  1:31 PM 06/29/2015  5:59 PM Full Code [id number]  Henreitta Leber, MD Inpatient     Family Communication: Son at the bedside Disposition Plan: To be determined  Consultants:  Cardiology  Pulmonary  Antibiotics:  Levaquin  Time spent: 28 minutes  [place], [place]

## 2017-03-13 NOTE — Progress Notes (Signed)
Patient's lasix and coreg held this morning d/t low BP per Dr. Mortimer Fries. HR has increased to 120's since holding coreg, however, BP has improved slightly. Per Dr. Mortimer Fries give dose of coreg now for HR and closely monitor BP changes. Coreg given. Wilnette Kales

## 2017-03-14 ENCOUNTER — Telehealth: Payer: Self-pay | Admitting: *Deleted

## 2017-03-14 DIAGNOSIS — J9621 Acute and chronic respiratory failure with hypoxia: Secondary | ICD-10-CM

## 2017-03-14 LAB — BASIC METABOLIC PANEL
ANION GAP: 9 (ref 5–15)
Anion gap: 6 (ref 5–15)
BUN: 27 mg/dL — ABNORMAL HIGH (ref 6–20)
BUN: 25 mg/dL — AB (ref 6–20)
CALCIUM: 8.9 mg/dL (ref 8.9–10.3)
CHLORIDE: 102 mmol/L (ref 101–111)
CO2: 26 mmol/L (ref 22–32)
CO2: 26 mmol/L (ref 22–32)
Calcium: 9.3 mg/dL (ref 8.9–10.3)
Chloride: 100 mmol/L — ABNORMAL LOW (ref 101–111)
Creatinine, Ser: 0.65 mg/dL (ref 0.44–1.00)
Creatinine, Ser: 0.81 mg/dL (ref 0.44–1.00)
GFR calc Af Amer: >60 mL/min (ref >60)
GFR calc non Af Amer: >60 mL/min (ref >60)
GLUCOSE: 131 mg/dL — AB (ref 65–99)
Glucose, Bld: 131 mg/dL — ABNORMAL HIGH (ref 65–99)
POTASSIUM: 4.5 mmol/L (ref 3.5–5.1)
Potassium: 4.9 mmol/L (ref 3.5–5.1)
Sodium: 135 mmol/L (ref 135–145)
Sodium: 134 mmol/L — ABNORMAL LOW (ref 135–145)

## 2017-03-14 LAB — ECHOCARDIOGRAM COMPLETE
CHL CUP MV DEC (S): 187
E decel time: 187 msec
E/e' ratio: 13.07
FS: 40 % (ref 28–44)
HEIGHTINCHES: 60 in
IV/PV OW: 0.96
LA diam end sys: 47 mm
LA diam index: 2.62 cm/m2
LASIZE: 47 mm
LV PW d: 8.57 mm — AB (ref 0.6–1.1)
LV TDI E'MEDIAL: 5.11
LV e' LATERAL: 10.1 cm/s
LVEEAVG: 13.07
LVEEMED: 13.07
Lateral S' vel: 13.8 cm/s
MV Peak grad: 7 mmHg
MV pk A vel: 60.4 m/s
MV pk E vel: 132 m/s
P 1/2 time: 280 ms
TAPSE: 16.9 mm
TDI e' lateral: 10.1
VTI: 123 cm
WEIGHTICAEL: 2592.61 [oz_av]

## 2017-03-14 LAB — PROCALCITONIN: PROCALCITONIN: 0.15 ng/mL

## 2017-03-14 LAB — CBC
HEMATOCRIT: 34.5 % — AB (ref 35.0–47.0)
Hemoglobin: 11.9 g/dL — ABNORMAL LOW (ref 12.0–16.0)
MCH: 40 pg — ABNORMAL HIGH (ref 26.0–34.0)
MCHC: 34.5 g/dL (ref 32.0–36.0)
MCV: 115.8 fL — AB (ref 80.0–100.0)
PLATELETS: 322 10*3/uL (ref 150–440)
RBC: 2.98 MIL/uL — ABNORMAL LOW (ref 3.80–5.20)
RDW: 21.1 % — AB (ref 11.5–14.5)
WBC: 11.6 10*3/uL — AB (ref 3.6–11.0)

## 2017-03-14 LAB — HIV ANTIBODY (ROUTINE TESTING W REFLEX): HIV SCREEN 4TH GENERATION: NONREACTIVE

## 2017-03-14 MED ORDER — FUROSEMIDE 10 MG/ML IJ SOLN
20.0000 mg | Freq: Two times a day (BID) | INTRAMUSCULAR | Status: DC
  Administered 2017-03-14 – 2017-03-15 (×2): 20 mg via INTRAVENOUS
  Filled 2017-03-14 (×2): qty 2

## 2017-03-14 MED ORDER — AZITHROMYCIN 250 MG PO TABS
500.0000 mg | ORAL_TABLET | ORAL | Status: AC
  Administered 2017-03-14 – 2017-03-16 (×3): 500 mg via ORAL
  Filled 2017-03-14: qty 2
  Filled 2017-03-14: qty 1
  Filled 2017-03-14: qty 2

## 2017-03-14 MED ORDER — SODIUM CHLORIDE 0.9 % IV BOLUS (SEPSIS)
500.0000 mL | Freq: Once | INTRAVENOUS | Status: AC
  Administered 2017-03-14: 500 mL via INTRAVENOUS

## 2017-03-14 NOTE — Progress Notes (Signed)
SUBJECTIVE: Patient is feeling much better   Vitals:   03/14/17 0600 03/14/17 0700 03/14/17 0748 03/14/17 0800  BP: (!) 77/64 94/67  (!) 81/61  Pulse: 88 (!) 109 98 100  Resp: 17 19 (!) 23 18  Temp:   98.1 F (36.7 C)   TempSrc:   Oral   SpO2: 95% 94% 93% 93%  Weight:      Height:        Intake/Output Summary (Last 24 hours) at 03/14/17 0843 Last data filed at 03/14/17 0733  Gross per 24 hour  Intake                0 ml  Output             1340 ml  Net            -1340 ml    LABS: Basic Metabolic Panel:  Recent Labs  03/13/17 0120 03/14/17 0300  NA 133* 135  K 4.3 4.9  CL 101 100*  CO2 23 26  GLUCOSE 116* 131*  BUN 20 27*  CREATININE 0.68 0.65  CALCIUM 8.7* 8.9   Liver Function Tests:  Recent Labs  03/12/17 1314  AST 24  ALT 11*  ALKPHOS 135*  BILITOT 9.4*  PROT 6.9  ALBUMIN 3.6   No results for input(s): LIPASE, AMYLASE in the last 72 hours. CBC:  Recent Labs  03/12/17 2129 03/13/17 0120 03/14/17 0300  WBC 11.9* 9.7 11.6*  NEUTROABS 9.6*  --   --   HGB 12.4 12.1 11.9*  HCT 36.3 35.1 34.5*  MCV 117.6* 115.5* 115.8*  PLT 320 335 322   Cardiac Enzymes:  Recent Labs  03/12/17 2016 03/13/17 0120 03/13/17 0802  TROPONINI 0.06* 0.06* 0.05*   BNP: Invalid input(s): POCBNP D-Dimer: No results for input(s): DDIMER in the last 72 hours. Hemoglobin A1C: No results for input(s): HGBA1C in the last 72 hours. Fasting Lipid Panel: No results for input(s): CHOL, HDL, LDLCALC, TRIG, CHOLHDL, LDLDIRECT in the last 72 hours. Thyroid Function Tests: No results for input(s): TSH, T4TOTAL, T3FREE, THYROIDAB in the last 72 hours.  Invalid input(s): FREET3 Anemia Panel: No results for input(s): VITAMINB12, FOLATE, FERRITIN, TIBC, IRON, RETICCTPCT in the last 72 hours.   PHYSICAL EXAM General: Well developed, well nourished, in no acute distress HEENT:  Normocephalic and atramatic Neck:  No JVD.  Lungs: Clear bilaterally to auscultation and  percussion. Heart: HRRR . Normal S1 and S2 without gallops or murmurs.  Abdomen: Bowel sounds are positive, abdomen soft and non-tender  Msk:  Back normal, normal gait. Normal strength and tone for age. Extremities: No clubbing, cyanosis or edema.   Neuro: Alert and oriented X 3. Psych:  Good affect, responds appropriately  TELEMETRY: Sinus tachycardia  ASSESSMENT AND PLAN: Mildly elevated troponin due to demand ischemia with pneumonia as well as CHF. Patient is gradually getting better we will locate the echo that was done last night.  Active Problems:   CHF (congestive heart failure) (HCC)   Shortness of breath   Elevated troponin    Shannon Schneider A, MD, [facility] 03/14/2017 8:43 AM

## 2017-03-14 NOTE — Progress Notes (Signed)
A&Ox4. Denies pain.  Blood pressure remains soft but stable and asymptomatic.  Afib per cardiac monitor with NSR and stach at times.  Dyspnic with exertion.

## 2017-03-14 NOTE — Progress Notes (Signed)
RN spoke with Dr. Leslye Peer and made MD aware that patient's last two blood pressures have been systolically in the low 20'E and patient is not symptomatic.  RN made MD aware that patient has not had her coreg today due to lasix being given this morning and marginal blood pressures that are on the softer side.  MD gave order to cancel transfer to telemetry.  MD also gave order to go ahead and give coreg now.

## 2017-03-14 NOTE — Progress Notes (Addendum)
Pharmacy Antibiotic Note  Shannon Schneider is a 45 y.o. female admitted on 03/12/2017 with Bronchitis.  Pharmacy has been consulted for levofloxacin dosing.  Plan: Patient being treated for CHF/AECOPD. Patient on methylprednisilone 40mg  IV Q12hr; likely contributing to leukocytosis. Per rounds on 3/15, patient transitioned to azithromycin 500mg  PO for remainder of 5 day course.   Height: 5' (152.4 cm) Weight: 167 lb 12.3 oz (76.1 kg) IBW/kg (Calculated) : 45.5  Temp (24hrs), Avg:97.7 F (36.5 C), Min:97.2 F (36.2 C), Max:98.1 F (36.7 C)   Recent Labs Lab 03/12/17 1314 03/12/17 2129 03/13/17 0120 03/14/17 0300  WBC 11.2* 11.9* 9.7 11.6*  CREATININE 0.51  --  0.68 0.65    Estimated Creatinine Clearance: 80 mL/min (by C-G formula based on SCr of 0.65 mg/dL).    No Known Allergies  Antimicrobials this admission: Levofloxacin 3/14  >> 3/15 Azithromycin 3/16 >> 3/18   Dose adjustments this admission:  Microbiology results: 3/14 MRSA: negative  3/14 Procalcitonin: 0.21  3/15 Procalcitonin: 0.15   Thank you for allowing pharmacy to be a part of this patient's care.  Ival Pacer L,  03/14/2017 9:11 AM

## 2017-03-14 NOTE — Progress Notes (Signed)
[facility] Progess Note    ASSESSMENT/PLAN   Acute on chronic respiratory failure. Multifactorial to include congestive heart failure/COPD. 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 Is being treated empirically for pneumonia. At this time we'll continue nocturnal BiPAP, nasal cannula during the day, empiric bronchodilator therapy with Solu-Medrol and complete a seven-day course of Levaquin for discolored sputum  Acute on chronic congestive heart failure. Patient with intermittent atrial flutter. Diuresis tolerated, patient is also on Coreg. Mild elevation in troponin filled to be demand ischemia. No clear ischemic changes on baseline EKG  Periductal mass per history. Pending outpatient biopsy  At this point patient is doing well and stable for floor transfer per primary team  Name: Shannon Schneider MRN: [id number] DOB: 1971/11/12    ADMISSION DATE:  03/12/2017   SUBJECTIVE:   Patient states she is feeling better over the last 24 hours. Has been weaned down to nasal cannula anywhere from 4-6 L and off of noninvasive ventilation. States that her breathing has significantly improved.   Review of Systems:  Constitutional: Feels well. Cardiovascular: No chest pain.  Pulmonary: Dyspnea improved The remainder of systems were reviewed and were found to be negative other than what is documented in the HPI.    VITAL SIGNS: Temp:  [97.2 F (36.2 C)-98.2 F (36.8 C)] 97.8 F (36.6 C) (03/16 0200) Pulse Rate:  [86-199] 109 (03/16 0700) Resp:  [10-33] 19 (03/16 0700) BP: (75-96)/(53-76) 94/67 (03/16 0700) SpO2:  [92 %-98 %] 94 % (03/16 0700) Weight:  [76.1 kg (167 lb 12.3 oz)] 76.1 kg (167 lb 12.3 oz) (03/16 0436) HEMODYNAMICS:   INTAKE / OUTPUT:  Intake/Output Summary (Last 24 hours) at 03/14/17 0726 Last data filed at 03/14/17 0436  Gross per 24 hour  Intake                0 ml  Output             1110 ml  Net             -1110 ml    PHYSICAL EXAMINATION: Physical Examination:   VS: BP 94/67   Pulse (!) 109   Temp 97.8 F (36.6 C) (Oral)   Resp 19   Ht 5' (1.524 m)   Wt 76.1 kg (167 lb 12.3 oz)   LMP 01/12/2017   SpO2 94%   BMI 32.77 kg/m   General Appearance: No distress  Neuro:without focal findings, mental status normal. HEENT: PERRLA, EOM intact. Pulmonary: Crackles appreciated bilateral posterior lung bases Cardiovascular irregularly irregular rhythm appreciated tachycardia at 110 Abdomen: Benign, Soft, non-tender. Renal:  No costovertebral tenderness  GU:  Not performed at this time. Endocrine: No evident thyromegaly. Skin:   warm, no rashes, no ecchymosis  Extremities: normal, no cyanosis, clubbing.   LABORATORY PANEL:   CBC  Recent Labs Lab 03/14/17 0300  WBC 11.6*  HGB 11.9*  HCT 34.5*  PLT 322    Chemistries   Recent Labs Lab 03/12/17 1314  03/14/17 0300  NA 134*  < > 135  K 5.0  < > 4.9  CL 99*  < > 100*  CO2 26  < > 26  GLUCOSE 112*  < > 131*  BUN 20  < > 27*  CREATININE 0.51  < > 0.65  CALCIUM 9.2  < > 8.9  AST 24  --   --   ALT 11*  --   --   ALKPHOS  135*  --   --   BILITOT 9.4*  --   --   < > = values in this interval not displayed.   Recent Labs Lab 03/12/17 2033  GLUCAP 145*    Recent Labs Lab 03/12/17 1956  PHART 7.32*  PCO2ART 40  PO2ART 61*    Recent Labs Lab 03/12/17 1314  AST 24  ALT 11*  ALKPHOS 135*  BILITOT 9.4*  ALBUMIN 3.6    Cardiac Enzymes  Recent Labs Lab 03/13/17 0802  TROPONINI 0.05*    RADIOLOGY:  Dg Chest 2 View  Result Date: 03/12/2017 CLINICAL DATA:  Shortness of breath EXAM: CHEST  2 VIEW COMPARISON:  01/01/2017 FINDINGS: Marked cardiopericardial enlargement, stable. There are [employer] and fissure thickening. Trace pleural fluid. No air bronchogram. IMPRESSION: Mild CHF. Electronically Signed   By: Monte Fantasia M.D.   On: 03/12/2017 14:24       Hermelinda Dellen, DO [facility] 03/14/2017

## 2017-03-14 NOTE — Progress Notes (Signed)
Pt is in no distress. Bipap on standby at bedside.

## 2017-03-14 NOTE — Telephone Encounter (Signed)
Patient went for a CT Scan on Wednesday at [facility] and was unable to complete that due to being short of breath when she was laying down. She wanted to know what her next steps should be.

## 2017-03-14 NOTE — Progress Notes (Signed)
Patient ID: Shannon Schneider, female   DOB: [birth year], 46 y.o.   MRN: [id number]  [employer] PROGRESS NOTE  EUNIQUE BALIK KWI:[id number] DOB: [birth year] DOA: 03/12/2017 PCP: Kasandra Knudsen, NP  HPI/Subjective: Patient feeling a little bit better today. Blood pressure was better earlier and now has dipped down again. Still with some cough.  Objective: Vitals:   03/14/17 1453 03/14/17 1500  BP: (!) 82/60 (!) 78/60  Pulse: (!) 106 78  Resp: 19 20  Temp:      Filed Weights   03/12/17 2035 03/13/17 0454 03/14/17 0436  Weight: 76.5 kg (168 lb 10.4 oz) 73.5 kg (162 lb 0.6 oz) 76.1 kg (167 lb 12.3 oz)    ROS: Review of Systems  Constitutional: Negative for chills and fever.  Eyes: Negative for blurred vision.  Respiratory: Positive for cough, sputum production and shortness of breath.   Cardiovascular: Negative for chest pain.  Gastrointestinal: Negative for abdominal pain, constipation, diarrhea, nausea and vomiting.  Genitourinary: Negative for dysuria.  Musculoskeletal: Negative for joint pain.  Neurological: Negative for dizziness and headaches.   Exam: Physical Exam  Constitutional: She is oriented to person, place, and time.  HENT:  Nose: No mucosal edema.  Mouth/Throat: No oropharyngeal exudate or posterior oropharyngeal edema.  Eyes: Conjunctivae, EOM and lids are normal. Pupils are equal, round, and reactive to light.  Neck: No JVD present. Carotid bruit is not present. No edema present. No thyroid mass and no thyromegaly present.  Cardiovascular: S1 normal and S2 normal.  Tachycardia present.  Exam reveals no gallop.   No murmur heard. Pulses:      Dorsalis pedis pulses are 2+ on the right side, and 2+ on the left side.  Respiratory: No respiratory distress. She has decreased breath sounds in the right upper field, the right middle field, the right lower field, the left upper field, the left middle field and the left lower field. She has wheezes in the right upper field  and the left upper field. She has no rhonchi. She has rales in the right lower field and the left lower field.  GI: Soft. Bowel sounds are normal. She exhibits distension. There is no tenderness.  Musculoskeletal:       Right knee: She exhibits swelling.       Left knee: She exhibits swelling.       Right ankle: She exhibits swelling.       Left ankle: She exhibits swelling.  Lymphadenopathy:    She has no cervical adenopathy.  Neurological: She is alert and oriented to person, place, and time. No cranial nerve deficit.  Skin: Skin is warm. No rash noted. Nails show no clubbing.  Psychiatric: She has a normal mood and affect.      Data Reviewed: Basic Metabolic Panel:  Recent Labs Lab 03/12/17 1314 03/13/17 0120 03/14/17 0300  NA 134* 133* 135  K 5.0 4.3 4.9  CL 99* 101 100*  CO2 26 23 26   GLUCOSE 112* 116* 131*  BUN 20 20 27*  CREATININE 0.51 0.68 0.65  CALCIUM 9.2 8.7* 8.9   Liver Function Tests:  Recent Labs Lab 03/12/17 1314  AST 24  ALT 11*  ALKPHOS 135*  BILITOT 9.4*  PROT 6.9  ALBUMIN 3.6    CBC:  Recent Labs Lab 03/12/17 1314 03/12/17 2129 03/13/17 0120 03/14/17 0300  WBC 11.2* 11.9* 9.7 11.6*  NEUTROABS  --  9.6*  --   --   HGB 12.6 12.4 12.1 11.9*  HCT 36.6  36.3 35.1 34.5*  MCV 117.0* 117.6* 115.5* 115.8*  PLT 302 320 335 322   Cardiac Enzymes:  Recent Labs Lab 03/12/17 1314 03/12/17 2016 03/13/17 0120 03/13/17 0802  TROPONINI 0.05* 0.06* 0.06* 0.05*   BNP (last 3 results)  Recent Labs  03/12/17 1315  BNP 1,525.0*     CBG:  Recent Labs Lab 03/12/17 2033  GLUCAP 145*    Recent Results (from the past 240 hour(s))  MRSA PCR Screening     Status: None   Collection Time: 03/12/17  8:47 PM  Result Value Ref Range Status   MRSA by PCR NEGATIVE NEGATIVE Final    Comment:        The GeneXpert MRSA Assay (FDA approved for NASAL specimens only), is one component of a comprehensive MRSA colonization surveillance program.  It is not intended to diagnose MRSA infection nor to guide or monitor treatment for MRSA infections.       Scheduled Meds: . azithromycin  500 mg Oral Q24H  . budesonide (PULMICORT) nebulizer solution  0.5 mg Nebulization BID  . carvedilol  3.125 mg Oral BID WC  . chlorhexidine  15 mL Mouth Rinse BID  . cholecalciferol  2,000 Units Oral Daily  . citalopram  10 mg Oral QHS  . enoxaparin (LOVENOX) injection  40 mg Subcutaneous Q24H  . furosemide  20 mg Intravenous Q12H  . ipratropium-albuterol  3 mL Nebulization Q4H  . loratadine  10 mg Oral Daily  . mouth rinse  15 mL Mouth Rinse q12n4p  . methylPREDNISolone (SOLU-MEDROL) injection  40 mg Intravenous Q12H  . mometasone-formoterol  2 puff Inhalation BID  . sodium chloride  500 mL Intravenous Once  . sodium chloride flush  3 mL Intravenous Q12H  . [START ON 03/15/2017] tiotropium  1 capsule Inhalation Daily    Assessment/Plan:  1. Acute hypoxic respiratory failure with hypoxia. Initially requiring BiPAP on presentation now down to 3 L nasal cannula. 2. Acute diastolic congestive heart failure With moderate to severe mitral regurgitation and with severe pulmonary hypertension and anasarca. Decrease Lasix 20 mg IV twice a day and low-dose Coreg. 3. COPD exacerbation. Continue Solu-Medrol and DuoNeb nebulizer solution and dulera inhaler. Levaquin switched to Zithromax by critical care specialist 4. Toxic granulations seen on CBC. Unfortunately no blood culture sent on presentation. Get a sputum culture. Switched to Zithromax by critical care specialist 5. History of sickle cell anemia 6. History of rectal mass 7. Tobacco abuse. Must stop smoking  Code Status:     Code Status Orders        Start     Ordered   03/12/17 1933  Full code  Continuous     03/12/17 1932    Code Status History    Date Active Date Inactive Code Status Order ID Comments User Context   12/30/2016  4:15 PM 01/02/2017  7:09 PM Full Code [id number]  Hillary Bow, MD ED   06/27/2015  1:31 PM 06/29/2015  5:59 PM Full Code [id number]  Henreitta Leber, MD Inpatient     Family Communication: Ginny Forth Disposition Plan: To be determined  Consultants:  Cardiology  Pulmonary  Antibiotics:  Levaquin  Time spent: 26 minutes  Loletha Grayer  [employer]

## 2017-03-15 ENCOUNTER — Inpatient Hospital Stay: Payer: BLUE CROSS/BLUE SHIELD

## 2017-03-15 LAB — CBC
HCT: 34.5 % — ABNORMAL LOW (ref 35.0–47.0)
Hemoglobin: 11.7 g/dL — ABNORMAL LOW (ref 12.0–16.0)
MCH: 40.2 pg — ABNORMAL HIGH (ref 26.0–34.0)
MCHC: 34 g/dL (ref 32.0–36.0)
MCV: 118.1 fL — ABNORMAL HIGH (ref 80.0–100.0)
PLATELETS: 286 10*3/uL (ref 150–440)
RBC: 2.92 MIL/uL — AB (ref 3.80–5.20)
RDW: 20.9 % — ABNORMAL HIGH (ref 11.5–14.5)
WBC: 10.7 10*3/uL (ref 3.6–11.0)

## 2017-03-15 LAB — BASIC METABOLIC PANEL
Anion gap: 7 (ref 5–15)
BUN: 26 mg/dL — ABNORMAL HIGH (ref 6–20)
CALCIUM: 9.1 mg/dL (ref 8.9–10.3)
CO2: 27 mmol/L (ref 22–32)
Chloride: 102 mmol/L (ref 101–111)
Creatinine, Ser: 0.62 mg/dL (ref 0.44–1.00)
GFR calc Af Amer: >60 mL/min (ref >60)
Glucose, Bld: 150 mg/dL — ABNORMAL HIGH (ref 65–99)
POTASSIUM: 4.7 mmol/L (ref 3.5–5.1)
Sodium: 136 mmol/L (ref 135–145)

## 2017-03-15 LAB — PHOSPHORUS: Phosphorus: 5 mg/dL — ABNORMAL HIGH (ref 2.5–4.6)

## 2017-03-15 LAB — PROCALCITONIN: Procalcitonin: 0.1 ng/mL

## 2017-03-15 LAB — MAGNESIUM: MAGNESIUM: 2.1 mg/dL (ref 1.7–2.4)

## 2017-03-15 MED ORDER — FUROSEMIDE 40 MG PO TABS
40.0000 mg | ORAL_TABLET | Freq: Every day | ORAL | Status: DC
  Administered 2017-03-16 – 2017-03-17 (×2): 40 mg via ORAL
  Filled 2017-03-15 (×2): qty 1

## 2017-03-15 MED ORDER — METHYLPREDNISOLONE SODIUM SUCC 40 MG IJ SOLR
40.0000 mg | INTRAMUSCULAR | Status: DC
  Administered 2017-03-16: 40 mg via INTRAVENOUS
  Filled 2017-03-15: qty 1

## 2017-03-15 NOTE — Progress Notes (Signed)
SUBJECTIVE: Pt is resting comfortably, no distress. Reports feeling much better.   Vitals:   03/15/17 0300 03/15/17 0336 03/15/17 0400 03/15/17 0600  BP:   (!) 74/50 (!) 81/68  Pulse: (!) 106  95 89  Resp: (!) 24  17 19   Temp:      TempSrc:      SpO2: 94% 96% 95% 95%  Weight:      Height:        Intake/Output Summary (Last 24 hours) at 03/15/17 0713 Last data filed at 03/15/17 0617  Gross per 24 hour  Intake              650 ml  Output             3465 ml  Net            -2815 ml    LABS: Basic Metabolic Panel:  Recent Labs  03/14/17 0300 03/14/17 2021 03/15/17 0516  NA 135 134*  --   K 4.9 4.5  --   CL 100* 102  --   CO2 26 26  --   GLUCOSE 131* 131*  --   BUN 27* 25*  --   CREATININE 0.65 0.81  --   CALCIUM 8.9 9.3  --   MG  --   --  2.1  PHOS  --   --  5.0*   Liver Function Tests:  Recent Labs  03/12/17 1314  AST 24  ALT 11*  ALKPHOS 135*  BILITOT 9.4*  PROT 6.9  ALBUMIN 3.6   No results for input(s): LIPASE, AMYLASE in the last 72 hours. CBC:  Recent Labs  03/12/17 2129  03/14/17 0300 03/15/17 0516  WBC 11.9*  < > 11.6* 10.7  NEUTROABS 9.6*  --   --   --   HGB 12.4  < > 11.9* 11.7*  HCT 36.3  < > 34.5* 34.5*  MCV 117.6*  < > 115.8* 118.1*  PLT 320  < > 322 286  < > = values in this interval not displayed. Cardiac Enzymes:  Recent Labs  03/12/17 2016 03/13/17 0120 03/13/17 0802  TROPONINI 0.06* 0.06* 0.05*   BNP: Invalid input(s): POCBNP D-Dimer: No results for input(s): DDIMER in the last 72 hours. Hemoglobin A1C: No results for input(s): HGBA1C in the last 72 hours. Fasting Lipid Panel: No results for input(s): CHOL, HDL, LDLCALC, TRIG, CHOLHDL, LDLDIRECT in the last 72 hours. Thyroid Function Tests: No results for input(s): TSH, T4TOTAL, T3FREE, THYROIDAB in the last 72 hours.  Invalid input(s): FREET3 Anemia Panel: No results for input(s): VITAMINB12, FOLATE, FERRITIN, TIBC, IRON, RETICCTPCT in the last 72  hours.   PHYSICAL EXAM General: Well developed, well nourished, in no acute distress HEENT:  Normocephalic and atramatic Neck:  No JVD.  Lungs: Clear bilaterally to auscultion Heart: HRRR . Normal S1 and S2 without gallops or murmurs.  Abdomen: Bowel sounds are positive, abdomen soft and non-tender  Extremities: No clubbing, cyanosis or edema.   Neuro: Alert and oriented X 3. Psych:  Good affect, responds appropriately  TELEMETRY: Sinus tachycardia.  ASSESSMENT AND PLAN: Mildly elevated troponin due to demand ischemia with pneumonia as well as CHF. Pt's fluid volume is gradually improving, however blood pressure remains low although she reports being asymptomatic. Continue to monitor.   Echo results from 03/13/17: The right ventricular systolic pressure was increased consistent   with severe pulmonary hypertension. Normal left ventricular   systolic function with grade 1 diastolic dysfunction and moderate  to severe mitral regurgitation and severe tricuspid regurgitation   with severe pulmonary hypertension.  Active Problems:   CHF (congestive heart failure) (HCC)   Shortness of breath   Elevated troponin    Shannon Bathe, NP-C 03/15/2017 7:13 AM

## 2017-03-15 NOTE — Progress Notes (Signed)
[facility] Progess Note    ASSESSMENT/PLAN   Acute on chronic respiratory failure. Multifactorial to include congestive heart failure/COPD. 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 Is being treated empirically for pneumonia. At this time we'll continue nocturnal BiPAP, nasal cannula during the day, empiric bronchodilator therapy with Solu-Medrol and complete a seven-day course of Levaquin for discolored sputum.   Hypotension. With her severe pulmonary hypertension would be cautious in her use of diuretics. She may need to run higher filling pressures in order to keep forward flow to avoid hypotension and renal insufficiency. Would hold diuretics and antihypertensives if systolic less than 90. She states she generally runs low blood pressures to begin with  Acute on chronic congestive heart failure. Patient with intermittent atrial flutter. Diuresis tolerated, patient is also on Coreg. Mild elevation in troponin filled to be demand ischemia. No clear ischemic changes on baseline EKG  Periductal mass per history. Pending outpatient biopsy  At this point patient is doing well and stable for floor transfer per primary team  Name: Shannon Schneider MRN: [id number] DOB: 12-29-1971    ADMISSION DATE:  03/12/2017   SUBJECTIVE:   Patient states she is feeling better over the last 24 hours. Has been weaned down to nasal cannula anywhere from 2-4 L and off of noninvasive ventilation. States that her breathing has significantly improved.difficulties with marginal blood pressure.   Review of Systems:  Constitutional: Feels well. Cardiovascular: No chest pain.  Pulmonary: Dyspnea improved The remainder of systems were reviewed and were found to be negative other than what is documented in the HPI.    VITAL SIGNS: Temp:  [97.9 F (36.6 C)-98.5 F (36.9 C)] 98.5 F (36.9 C) (03/16 1600) Pulse Rate:  [78-135] 89 (03/17  0600) Resp:  [15-30] 19 (03/17 0600) BP: (74-122)/(50-79) 81/68 (03/17 0600) SpO2:  [85 %-97 %] 95 % (03/17 0600) HEMODYNAMICS:   INTAKE / OUTPUT:  Intake/Output Summary (Last 24 hours) at 03/15/17 0745 Last data filed at 03/15/17 0617  Gross per 24 hour  Intake              650 ml  Output             3215 ml  Net            -2565 ml    PHYSICAL EXAMINATION: Physical Examination:   VS: BP (!) 81/68   Pulse 89   Temp 98.5 F (36.9 C) (Oral)   Resp 19   Ht 5' (1.524 m)   Wt 76.1 kg (167 lb 12.3 oz)   LMP 01/12/2017   SpO2 95%   BMI 32.77 kg/m   General Appearance: No distress  Neuro:without focal findings, mental status normal. HEENT: PERRLA, EOM intact. Pulmonary: Crackles appreciated bilateral posterior lung bases Cardiovascular irregularly irregular rhythm appreciated tachycardia at 110 Abdomen: Benign, Soft, non-tender. Renal:  No costovertebral tenderness  GU:  Not performed at this time. Endocrine: No evident thyromegaly. Skin:   warm, no rashes, no ecchymosis  Extremities: normal, no cyanosis, clubbing.   LABORATORY PANEL:   CBC  Recent Labs Lab 03/15/17 0516  WBC 10.7  HGB 11.7*  HCT 34.5*  PLT 286    Chemistries   Recent Labs Lab 03/12/17 1314  03/15/17 0516  NA 134*  < > 136  K 5.0  < > 4.7  CL 99*  < > 102  CO2 26  < > 27  GLUCOSE 112*  < >  150*  BUN 20  < > 26*  CREATININE 0.51  < > 0.62  CALCIUM 9.2  < > 9.1  MG  --   --  2.1  PHOS  --   --  5.0*  AST 24  --   --   ALT 11*  --   --   ALKPHOS 135*  --   --   BILITOT 9.4*  --   --   < > = values in this interval not displayed.   Recent Labs Lab 03/12/17 2033  GLUCAP 145*    Recent Labs Lab 03/12/17 1956  PHART 7.32*  PCO2ART 40  PO2ART 61*    Recent Labs Lab 03/12/17 1314  AST 24  ALT 11*  ALKPHOS 135*  BILITOT 9.4*  ALBUMIN 3.6    Cardiac Enzymes  Recent Labs Lab 03/13/17 0802  TROPONINI 0.05*    RADIOLOGY:  No results found.     Hermelinda Dellen, DO [facility] 03/15/2017

## 2017-03-15 NOTE — Progress Notes (Signed)
Pt A&O X4. Denies pain. On 2L [place] at beginning of shift. Foley removed around 2200. Pt post cath removal void currently 250. Will continue to assess. Also becomes very dyspnic when using bedpan. [place] turned up to 4L by RT, continuing to currently wean back down to 2L.  BP still remains soft, but still asymptomatic and stable. Will continue to monitor.

## 2017-03-15 NOTE — Evaluation (Signed)
Physical Therapy Evaluation Patient Details Name: Shannon Schneider MRN: [id number] DOB: Feb 04, 1971 Today's Date: 03/15/2017   History of Present Illness  Shannon Schneider is a 46yo black person who identifies as female, comes to [facility] due to progressive SOB at home. Pt admitted for CHF exacerbation. PMH: sickle cell disease, COPD on 2L/min chronically. At baseline pt tolerates very limited community distances without AD and on 2L/min O2. She denies any falls in the past 6 months.   Clinical Impression  Pt admitted with above diagnosis. Pt currently with functional limitations due to the deficits listed below (see "PT Problem List"). Upon entry, the patient is received seated at edge of bed, no family/caregiver present.   The pt is awake and agreeable to participate. No acute distress noted at this time. HR is easily elevated into 130's with simple mobility such as transfers. Pt received on and remaining on 3L O2 throughout evaluation, with noted saturation of >89% at rest and intermittent drops to mid 80's with simple low exertion activity, whereas pt is not on 2L/min O2 at baseline at home. Functional mobility assessment limited due sudden drops in SaO2 and sudden tachycardia with basic mobility, however strength and balance appear to be near baseline.   Pt will benefit from skilled PT intervention to increase independence and safety with basic mobility in preparation for discharge to the venue listed below.       Follow Up Recommendations Outpatient PT    Equipment Recommendations  None recommended by PT    Recommendations for Other Services       Precautions / Restrictions Precautions Precautions: Fall      Mobility  Bed Mobility               General bed mobility comments: received sitting EOB, not assessed  Transfers Overall transfer level: Independent Equipment used: None                Ambulation/Gait Ambulation/Gait assistance: Supervision Ambulation Distance (Feet):  60 Feet       Gait velocity interpretation: <1.8 ft/sec, indicative of risk for recurrent falls General Gait Details: assistance due to lines/leads. distance limited d/t desat and tachycardia 130s. Pt subjectively feels "fine."  Stairs            Wheelchair Mobility    Modified Rankin (Stroke Patients Only)       Balance Overall balance assessment: Independent                                           Pertinent Vitals/Pain Pain Assessment: No/denies pain    Home Living Family/patient expects to be discharged to:: Private residence Living Arrangements: Spouse/significant other Available Help at Discharge: Family Type of Home: House Home Access: Stairs to enter Entrance Stairs-Rails: Psychiatric nurse of Steps: 4-5 Home Layout: One level Home Equipment: Environmental consultant - 2 wheels      Prior Function Level of Independence: Independent               Hand Dominance        Extremity/Trunk Assessment   Upper Extremity Assessment Upper Extremity Assessment: Overall WFL for tasks assessed    Lower Extremity Assessment Lower Extremity Assessment: Overall WFL for tasks assessed       Communication   Communication: No difficulties  Cognition Arousal/Alertness: Awake/alert Behavior During Therapy: WFL for tasks assessed/performed Overall Cognitive Status: Within Functional Limits  for tasks assessed                      General Comments      Exercises     Assessment/Plan    PT Assessment Patient needs continued PT services  PT Problem List Decreased activity tolerance;Decreased mobility;Cardiopulmonary status limiting activity       PT Treatment Interventions DME instruction;Gait training;Stair training;Functional mobility training;Therapeutic activities;Therapeutic exercise;Patient/family education    PT Goals (Current goals can be found in the Care Plan section)  Acute Rehab PT Goals Patient Stated Goal:  return to home, improve ability to access the community PT Goal Formulation: With patient Time For Goal Achievement: 03/29/17 Potential to Achieve Goals: Good    Frequency Min 2X/week   Barriers to discharge        Co-evaluation               End of Session Equipment Utilized During Treatment: Gait belt;Oxygen Activity Tolerance: Patient tolerated treatment well;Treatment limited secondary to medical complications (Comment) Patient left: in chair;with call bell/phone within reach Nurse Communication: Mobility status;Other (comment) PT Visit Diagnosis: Other abnormalities of gait and mobility (R26.89)         Time: 1601-0932 PT Time Calculation (min) (ACUTE ONLY): 12 min   Charges:   PT Evaluation $PT Eval Moderate Complexity: 1 Procedure     PT G Codes:        12:39 PM, 2017/04/12 Etta Grandchild, PT, DPT Physical Therapist - [place] [phone] (mobile)

## 2017-03-15 NOTE — Progress Notes (Signed)
I spoke to Dr. Jefferson Fuel regarding administration of lasix and coreg. Per Dr. Jefferson Fuel will hold coreg and lasix unless SBP >/= 89mmhg.

## 2017-03-15 NOTE — Progress Notes (Signed)
Patient ID: Shannon Schneider, female   DOB: Mar 15, 1971, 46 y.o.   MRN: [id number]   [employer] PROGRESS NOTE  FILOMENA POKORNEY JIR:[id number] DOB: [birth year] DOA: 03/12/2017 PCP: Kasandra Knudsen, NP  HPI/Subjective: Patient feeling a little bit better today. Moving a little bit better air. She is asking how long she needs to stay in the hospital.  Objective: Vitals:   03/15/17 1205 03/15/17 1400  BP:  97/76  Pulse: (!) 135 (!) 128  Resp:  (!) 21  Temp:      Filed Weights   03/12/17 2035 03/13/17 0454 03/14/17 0436  Weight: 76.5 kg (168 lb 10.4 oz) 73.5 kg (162 lb 0.6 oz) 76.1 kg (167 lb 12.3 oz)    ROS: Review of Systems  Constitutional: Negative for chills and fever.  Eyes: Negative for blurred vision.  Respiratory: Positive for cough, sputum production and shortness of breath.   Cardiovascular: Negative for chest pain.  Gastrointestinal: Negative for abdominal pain, constipation, diarrhea, nausea and vomiting.  Genitourinary: Negative for dysuria.  Musculoskeletal: Negative for joint pain.  Neurological: Negative for dizziness and headaches.   Exam: Physical Exam  Constitutional: She is oriented to person, place, and time.  HENT:  Nose: No mucosal edema.  Mouth/Throat: No oropharyngeal exudate or posterior oropharyngeal edema.  Eyes: Conjunctivae, EOM and lids are normal. Pupils are equal, round, and reactive to light.  Neck: No JVD present. Carotid bruit is not present. No edema present. No thyroid mass and no thyromegaly present.  Cardiovascular: S1 normal and S2 normal.  Tachycardia present.  Exam reveals no gallop.   No murmur heard. Pulses:      Dorsalis pedis pulses are 2+ on the right side, and 2+ on the left side.  Respiratory: No respiratory distress. She has decreased breath sounds in the right lower field and the left lower field. She has wheezes in the right middle field and the left middle field. She has no rhonchi. She has no rales.  GI: Soft. Bowel sounds are  normal. She exhibits distension. There is no tenderness.  Musculoskeletal:       Right knee: She exhibits swelling.       Left knee: She exhibits swelling.       Right ankle: She exhibits swelling.       Left ankle: She exhibits swelling.  Lymphadenopathy:    She has no cervical adenopathy.  Neurological: She is alert and oriented to person, place, and time. No cranial nerve deficit.  Skin: Skin is warm. No rash noted. Nails show no clubbing.  Psychiatric: She has a normal mood and affect.      Data Reviewed: Basic Metabolic Panel:  Recent Labs Lab 03/12/17 1314 03/13/17 0120 03/14/17 0300 03/14/17 2021 03/15/17 0516  NA 134* 133* 135 134* 136  K 5.0 4.3 4.9 4.5 4.7  CL 99* 101 100* 102 102  CO2 26 23 26 26 27   GLUCOSE 112* 116* 131* 131* 150*  BUN 20 20 27* 25* 26*  CREATININE 0.51 0.68 0.65 0.81 0.62  CALCIUM 9.2 8.7* 8.9 9.3 9.1  MG  --   --   --   --  2.1  PHOS  --   --   --   --  5.0*   Liver Function Tests:  Recent Labs Lab 03/12/17 1314  AST 24  ALT 11*  ALKPHOS 135*  BILITOT 9.4*  PROT 6.9  ALBUMIN 3.6    CBC:  Recent Labs Lab 03/12/17 1314 03/12/17 2129 03/13/17 0120  03/14/17 0300 03/15/17 0516  WBC 11.2* 11.9* 9.7 11.6* 10.7  NEUTROABS  --  9.6*  --   --   --   HGB 12.6 12.4 12.1 11.9* 11.7*  HCT 36.6 36.3 35.1 34.5* 34.5*  MCV 117.0* 117.6* 115.5* 115.8* 118.1*  PLT 302 320 335 322 286   Cardiac Enzymes:  Recent Labs Lab 03/12/17 1314 03/12/17 2016 03/13/17 0120 03/13/17 0802  TROPONINI 0.05* 0.06* 0.06* 0.05*   BNP (last 3 results)  Recent Labs  03/12/17 1315  BNP 1,525.0*     CBG:  Recent Labs Lab 03/12/17 2033  GLUCAP 145*    Recent Results (from the past 240 hour(s))  MRSA PCR Screening     Status: None   Collection Time: 03/12/17  8:47 PM  Result Value Ref Range Status   MRSA by PCR NEGATIVE NEGATIVE Final    Comment:        The GeneXpert MRSA Assay (FDA approved for NASAL specimens only), is one  component of a comprehensive MRSA colonization surveillance program. It is not intended to diagnose MRSA infection nor to guide or monitor treatment for MRSA infections.       Scheduled Meds: . azithromycin  500 mg Oral Q24H  . budesonide (PULMICORT) nebulizer solution  0.5 mg Nebulization BID  . carvedilol  3.125 mg Oral BID WC  . chlorhexidine  15 mL Mouth Rinse BID  . cholecalciferol  2,000 Units Oral Daily  . citalopram  10 mg Oral QHS  . enoxaparin (LOVENOX) injection  40 mg Subcutaneous Q24H  . furosemide  40 mg Oral Daily  . ipratropium-albuterol  3 mL Nebulization Q4H  . loratadine  10 mg Oral Daily  . mouth rinse  15 mL Mouth Rinse q12n4p  . [START ON 03/16/2017] methylPREDNISolone (SOLU-MEDROL) injection  40 mg Intravenous Q24H  . mometasone-formoterol  2 puff Inhalation BID  . sodium chloride flush  3 mL Intravenous Q12H  . tiotropium  1 capsule Inhalation Daily    Assessment/Plan:  1. Acute hypoxic respiratory failure with hypoxia. Initially requiring BiPAP.  I turn the patient down to 3 L nasal cannula. 2. Acute diastolic congestive heart failure With moderate to severe mitral regurgitation and with severe pulmonary hypertension and anasarca. Change Lasix to 40 mg orally daily starting tomorrow and low-dose Coreg. 3. Relative hypotension. Patient's normal blood pressure is in the 90s. Continue Coreg and Lasix even with the low blood pressure 4. COPD exacerbation. Continue Solu-Medrol daily and DuoNeb nebulizer solution and dulera inhaler. Levaquin switched to Zithromax by critical care specialist 5. Toxic granulations seen on CBC. Unfortunately no blood culture sent on presentation. Get a sputum culture. Switched to Zithromax by critical care specialist 6. History of sickle cell anemia 7. History of rectal mass 8. Tobacco abuse. Must stop smoking 9. Fluid in the abdomen on exam. Get an ultrasound to check for ascites  Code Status:     Code Status Orders         Start     Ordered   03/12/17 1933  Full code  Continuous     03/12/17 1932    Code Status History    Date Active Date Inactive Code Status Order ID Comments User Context   12/30/2016  4:15 PM 01/02/2017  7:09 PM Full Code [id number]  Hillary Bow, MD ED   06/27/2015  1:31 PM 06/29/2015  5:59 PM Full Code [id number]  Henreitta Leber, MD Inpatient     Disposition Plan: To be determined  Consultants:  Cardiology  Pulmonary  Antibiotics:  Levaquin  Time spent: 26 minutes  Loletha Grayer  [employer]

## 2017-03-15 NOTE — Progress Notes (Signed)
Dr. Leslye Peer updated pt's orders in [facility] and changed her IV lasix to PO. Per [facility] it is due now, I called Dr. Leslye Peer to verify this as she received her last IV dose this AM. Per Dr. Leslye Peer, PO lasix should start tomorrow. Will hold this dose.

## 2017-03-15 NOTE — Progress Notes (Addendum)
Pt to transfer to 2A. She is aware and agreeable to transfer. Caryl Pina, RN (receiving nurse) has been given report over the phone.   Update 1654: Pt transferred to room 243 with assistance from [place], [place]. She was placed on telemetry box [id number]. NT on 2A came to room to assist with pt transfer. Caryl Pina, RN notified of pt's arrival to unit. Pt remained A&O x 4, assessment unchanged from this morning.

## 2017-03-16 MED ORDER — PREDNISONE 20 MG PO TABS
40.0000 mg | ORAL_TABLET | Freq: Every day | ORAL | Status: DC
  Administered 2017-03-17: 40 mg via ORAL
  Filled 2017-03-16: qty 2

## 2017-03-16 NOTE — Plan of Care (Signed)
Problem: Safety: Goal: Ability to remain free from injury will improve Outcome: Progressing Bed alarm in use and call light in reach

## 2017-03-16 NOTE — Progress Notes (Signed)
SUBJECTIVE: Pt is feeling well. She states she is not short of breath on 3L oxygen. She typically uses 2-3L at home.    Vitals:   03/15/17 1954 03/15/17 2037 03/16/17 0442 03/16/17 0448  BP: 95/73  (!) 84/63 92/72  Pulse: (!) 130  90 (!) 108  Resp:   18   Temp:   97.7 F (36.5 C)   TempSrc:   Oral   SpO2: 90% 90% 95%   Weight:   161 lb 4.8 oz (73.2 kg)   Height:        Intake/Output Summary (Last 24 hours) at 03/16/17 0731 Last data filed at 03/16/17 0229  Gross per 24 hour  Intake              600 ml  Output              900 ml  Net             -300 ml    LABS: Basic Metabolic Panel:  Recent Labs  03/14/17 2021 03/15/17 0516  NA 134* 136  K 4.5 4.7  CL 102 102  CO2 26 27  GLUCOSE 131* 150*  BUN 25* 26*  CREATININE 0.81 0.62  CALCIUM 9.3 9.1  MG  --  2.1  PHOS  --  5.0*   Liver Function Tests: No results for input(s): AST, ALT, ALKPHOS, BILITOT, PROT, ALBUMIN in the last 72 hours. No results for input(s): LIPASE, AMYLASE in the last 72 hours. CBC:  Recent Labs  03/14/17 0300 03/15/17 0516  WBC 11.6* 10.7  HGB 11.9* 11.7*  HCT 34.5* 34.5*  MCV 115.8* 118.1*  PLT 322 286   Cardiac Enzymes:  Recent Labs  03/13/17 0802  TROPONINI 0.05*   BNP: Invalid input(s): POCBNP D-Dimer: No results for input(s): DDIMER in the last 72 hours. Hemoglobin A1C: No results for input(s): HGBA1C in the last 72 hours. Fasting Lipid Panel: No results for input(s): CHOL, HDL, LDLCALC, TRIG, CHOLHDL, LDLDIRECT in the last 72 hours. Thyroid Function Tests: No results for input(s): TSH, T4TOTAL, T3FREE, THYROIDAB in the last 72 hours.  Invalid input(s): FREET3 Anemia Panel: No results for input(s): VITAMINB12, FOLATE, FERRITIN, TIBC, IRON, RETICCTPCT in the last 72 hours.   PHYSICAL EXAM General: Well developed, well nourished, in no acute distress HEENT:  Normocephalic and atramatic Neck:  No JVD.  Lungs: Clear bilaterally to auscultation and percussion. Heart:  HRRR . Normal S1 and S2 without gallops or murmurs.  Abdomen: Bowel sounds are positive, abdomen soft and non-tender  Msk:  Back normal, normal gait. Normal strength and tone for age. Extremities: No clubbing, cyanosis or edema.   Neuro: Alert and oriented X 3. Psych:  Good affect, responds appropriately  TELEMETRY: NSR 88 bpm  ASSESSMENT AND PLAN:  Mildly elevated troponin due to demand ischemia with pneumonia as well as CHF. Relative hypotension although pt is asymptomatic.  Lung congestion is gradually improving, continue PO lasix. Wean oxygen as tolerated.  Echo results from 03/13/17: The right ventricular systolic pressure was increased consistent with severe pulmonary hypertension. Normal left ventricular systolic function with grade 1 diastolic dysfunction and moderate to severe mitral regurgitation and severe tricuspid regurgitation with severe pulmonary hypertension.   Active Problems:   CHF (congestive heart failure) (HCC)   Shortness of breath   Elevated troponin    [name], NP-C 03/16/2017 7:31 AM

## 2017-03-16 NOTE — Progress Notes (Signed)
[facility] Progess Note    ASSESSMENT/PLAN   Acute on chronic respiratory failure. Multifactorial to include congestive heart failure/COPD. 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 Is being treated empirically for pneumonia. She is doing much better today. Stable blood pressure, on low-flow nasal cannula.  Hypotension. With her severe pulmonary hypertension would be cautious in her use of diuretics. She may need to run higher filling pressures in order to keep forward flow to avoid hypotension and renal insufficiency. Would hold diuretics and antihypertensives if systolic less than 90. She states she generally runs low blood pressures to begin with  Acute on chronic congestive heart failure. Patient with intermittent atrial flutter.  Periductal mass per history. Pending outpatient biopsy  No significant change in recommendations. Patient is followed by outpatient pulmonologist. We will sign off Please reconsult if can be of any further assistance    Name: Shannon Schneider MRN: [id number] DOB: 08/20/1971    ADMISSION DATE:  03/12/2017   SUBJECTIVE:   Patient states she is feeling better over the last 24 hours. Has been weaned down to nasal cannula anywhere from 2-4 L and off of noninvasive ventilation. States that her breathing has significantly improved.difficulties with marginal blood pressure.   Review of Systems:  Constitutional: Feels well. Cardiovascular: No chest pain.  Pulmonary: Dyspnea improved The remainder of systems were reviewed and were found to be negative other than what is documented in the HPI.    VITAL SIGNS: Temp:  [97.7 F (36.5 C)-98.1 F (36.7 C)] 97.7 F (36.5 C) (03/18 0442) Pulse Rate:  [90-135] 95 (03/18 0837) Resp:  [16-27] 18 (03/18 0442) BP: (84-100)/(63-76) 94/76 (03/18 0837) SpO2:  [84 %-98 %] 95 % (03/18 0750) Weight:  [72.8 kg (160 lb 7.9 oz)-73.2 kg (161 lb 4.8 oz)] 73.2  kg (161 lb 4.8 oz) (03/18 0442) HEMODYNAMICS:   INTAKE / OUTPUT:  Intake/Output Summary (Last 24 hours) at 03/16/17 0929 Last data filed at 03/16/17 0229  Gross per 24 hour  Intake              600 ml  Output              375 ml  Net              225 ml    PHYSICAL EXAMINATION: Physical Examination:   VS: BP 94/76   Pulse 95   Temp 97.7 F (36.5 C) (Oral)   Resp 18   Ht 5' (1.524 m)   Wt 73.2 kg (161 lb 4.8 oz)   LMP 01/12/2017   SpO2 95%   BMI 31.50 kg/m   General Appearance: No distress  Neuro:without focal findings, mental status normal. HEENT: PERRLA, EOM intact. Pulmonary: Crackles appreciated bilateral posterior lung bases Cardiovascular irregularly irregular rhythm appreciated tachycardia at 110 Abdomen: Benign, Soft, non-tender. Renal:  No costovertebral tenderness  GU:  Not performed at this time. Endocrine: No evident thyromegaly. Skin:   warm, no rashes, no ecchymosis  Extremities: normal, no cyanosis, clubbing.   LABORATORY PANEL:   CBC  Recent Labs Lab 03/15/17 0516  WBC 10.7  HGB 11.7*  HCT 34.5*  PLT 286    Chemistries   Recent Labs Lab 03/12/17 1314  03/15/17 0516  NA 134*  < > 136  K 5.0  < > 4.7  CL 99*  < > 102  CO2 26  < > 27  GLUCOSE 112*  < > 150*  BUN  20  < > 26*  CREATININE 0.51  < > 0.62  CALCIUM 9.2  < > 9.1  MG  --   --  2.1  PHOS  --   --  5.0*  AST 24  --   --   ALT 11*  --   --   ALKPHOS 135*  --   --   BILITOT 9.4*  --   --   < > = values in this interval not displayed.   Recent Labs Lab 03/12/17 2033  GLUCAP 145*    Recent Labs Lab 03/12/17 1956  PHART 7.32*  PCO2ART 40  PO2ART 61*    Recent Labs Lab 03/12/17 1314  AST 24  ALT 11*  ALKPHOS 135*  BILITOT 9.4*  ALBUMIN 3.6    Cardiac Enzymes  Recent Labs Lab 03/13/17 0802  TROPONINI 0.05*    RADIOLOGY:  US Abdomen Limited  Result Date: 03/15/2017 CLINICAL DATA:  Abdominal distention.  Clinical concern for ascites. EXAM: LIMITED  ABDOMEN ULTRASOUND FOR ASCITES TECHNIQUE: Limited ultrasound survey for ascites was performed in all four abdominal quadrants. COMPARISON:  09/24/2016. FINDINGS: Small to moderate amount of free peritoneal fluid, most pronounced in the right upper quadrant of the abdomen. Small right pleural effusion. IMPRESSION: Small to moderate amount of ascites and small right pleural effusion. Electronically Signed   By: Claudie Revering M.D.   On: 03/15/2017 16:33       Hermelinda Dellen, DO [facility] 03/16/2017

## 2017-03-16 NOTE — Progress Notes (Signed)
Patient ID: Shannon Schneider, female   DOB: June 29, 1971, 46 y.o.   MRN: [id number]    [facility] PROGRESS NOTE  Shannon Schneider:[id number] DOB: 01-20-1971 DOA: 03/12/2017 PCP: Kasandra Knudsen, NP  HPI/Subjective: Patient feeling a little bit better every day. Limited with medications secondary to hypotension. Patient still with some cough.  Objective: Vitals:   03/16/17 0837 03/16/17 1209  BP: 94/76 98/72  Pulse: 95 90  Resp:  18  Temp:  98.2 F (36.8 C)    Filed Weights   03/14/17 0436 03/15/17 1432 03/16/17 0442  Weight: 76.1 kg (167 lb 12.3 oz) 72.8 kg (160 lb 7.9 oz) 73.2 kg (161 lb 4.8 oz)    ROS: Review of Systems  Constitutional: Negative for chills and fever.  Eyes: Negative for blurred vision.  Respiratory: Positive for cough and shortness of breath. Negative for sputum production.   Cardiovascular: Negative for chest pain.  Gastrointestinal: Negative for abdominal pain, constipation, diarrhea, nausea and vomiting.  Genitourinary: Negative for dysuria.  Musculoskeletal: Negative for joint pain.  Neurological: Negative for dizziness and headaches.   Exam: Physical Exam  Constitutional: She is oriented to person, place, and time.  HENT:  Nose: No mucosal edema.  Mouth/Throat: No oropharyngeal exudate or posterior oropharyngeal edema.  Eyes: EOM and lids are normal. Pupils are equal, round, and reactive to light.  Conjunctivae jaundiced  Neck: No JVD present. Carotid bruit is not present. No edema present. No thyroid mass and no thyromegaly present.  Cardiovascular: S1 normal and S2 normal.  Tachycardia present.  Exam reveals no gallop.   No murmur heard. Pulses:      Dorsalis pedis pulses are 2+ on the right side, and 2+ on the left side.  Respiratory: No respiratory distress. She has decreased breath sounds in the right lower field and the left lower field. She has no wheezes. She has no rhonchi. She has no rales.  GI: Soft. Bowel sounds are normal. She  exhibits distension. There is no tenderness.  Musculoskeletal:       Right knee: She exhibits swelling.       Left knee: She exhibits swelling.       Right ankle: She exhibits swelling.       Left ankle: She exhibits swelling.  Lymphadenopathy:    She has no cervical adenopathy.  Neurological: She is alert and oriented to person, place, and time. No cranial nerve deficit.  Skin: Skin is warm. No rash noted. Nails show no clubbing.  Psychiatric: She has a normal mood and affect.      Data Reviewed: Basic Metabolic Panel:  Recent Labs Lab 03/12/17 1314 03/13/17 0120 03/14/17 0300 03/14/17 2021 03/15/17 0516  NA 134* 133* 135 134* 136  K 5.0 4.3 4.9 4.5 4.7  CL 99* 101 100* 102 102  CO2 26 23 26 26 27   GLUCOSE 112* 116* 131* 131* 150*  BUN 20 20 27* 25* 26*  CREATININE 0.51 0.68 0.65 0.81 0.62  CALCIUM 9.2 8.7* 8.9 9.3 9.1  MG  --   --   --   --  2.1  PHOS  --   --   --   --  5.0*   Liver Function Tests:  Recent Labs Lab 03/12/17 1314  AST 24  ALT 11*  ALKPHOS 135*  BILITOT 9.4*  PROT 6.9  ALBUMIN 3.6    CBC:  Recent Labs Lab 03/12/17 1314 03/12/17 2129 03/13/17 0120 03/14/17 0300 03/15/17 0516  WBC 11.2* 11.9* 9.7 11.6*  10.7  NEUTROABS  --  9.6*  --   --   --   HGB 12.6 12.4 12.1 11.9* 11.7*  HCT 36.6 36.3 35.1 34.5* 34.5*  MCV 117.0* 117.6* 115.5* 115.8* 118.1*  PLT 302 320 335 322 286   Cardiac Enzymes:  Recent Labs Lab 03/12/17 1314 03/12/17 2016 03/13/17 0120 03/13/17 0802  TROPONINI 0.05* 0.06* 0.06* 0.05*   BNP (last 3 results)  Recent Labs  03/12/17 1315  BNP 1,525.0*     CBG:  Recent Labs Lab 03/12/17 2033  GLUCAP 145*    Recent Results (from the past 240 hour(s))  MRSA PCR Screening     Status: None   Collection Time: 03/12/17  8:47 PM  Result Value Ref Range Status   MRSA by PCR NEGATIVE NEGATIVE Final    Comment:        The GeneXpert MRSA Assay (FDA approved for NASAL specimens only), is one component of  a comprehensive MRSA colonization surveillance program. It is not intended to diagnose MRSA infection nor to guide or monitor treatment for MRSA infections.       Scheduled Meds: . azithromycin  500 mg Oral Q24H  . budesonide (PULMICORT) nebulizer solution  0.5 mg Nebulization BID  . carvedilol  3.125 mg Oral BID WC  . chlorhexidine  15 mL Mouth Rinse BID  . cholecalciferol  2,000 Units Oral Daily  . citalopram  10 mg Oral QHS  . enoxaparin (LOVENOX) injection  40 mg Subcutaneous Q24H  . furosemide  40 mg Oral Daily  . ipratropium-albuterol  3 mL Nebulization Q4H  . loratadine  10 mg Oral Daily  . mouth rinse  15 mL Mouth Rinse q12n4p  . methylPREDNISolone (SOLU-MEDROL) injection  40 mg Intravenous Q24H  . mometasone-formoterol  2 puff Inhalation BID  . sodium chloride flush  3 mL Intravenous Q12H  . tiotropium  1 capsule Inhalation Daily    Assessment/Plan:  1. Acute hypoxic respiratory failure with hypoxia. Initially requiring BiPAP.  I turned the patient down to 3 L nasal cannula.  The patient is baseline on 2 L. 2. Acute diastolic congestive heart failure With moderate to severe mitral regurgitation and with severe pulmonary hypertension and anasarca. Change Lasix to 40 mg orally and low-dose Coreg. 3. Relative hypotension. Patient's normal blood pressure is in the 90s. Continue Coreg and Lasix even with the low blood pressure 4. COPD exacerbation. Changed over to oral prednisone for tomorrow.  DuoNeb nebulizer solution and dulera inhaler. Levaquin switched to Zithromax by critical care specialist 5. Toxic granulations seen on CBC. Unfortunately no blood culture sent on presentation. Switched to Zithromax by critical care specialist 6. History of sickle cell anemia 7. History of rectal mass 8. Tobacco abuse. Must stop smoking 9. Fluid in the abdomen on exam. Patient did not want a paracentesis for her ascites in the abdomen 10. Jaundice. This is been chronic for her likely  related to the sickle cell disease  Code Status:     Code Status Orders        Start     Ordered   03/12/17 1933  Full code  Continuous     03/12/17 1932    Code Status History    Date Active Date Inactive Code Status Order ID Comments User Context   12/30/2016  4:15 PM 01/02/2017  7:09 PM Full Code [id number]  Hillary Bow, MD ED   06/27/2015  1:31 PM 06/29/2015  5:59 PM Full Code [id number]  Henreitta Leber, MD  Inpatient     Disposition Plan: To be determined  Consultants:  Cardiology  Pulmonary  Antibiotics:  zithromax  Time spent: 22 minutes. Patient's brother at the bedside  Loletha Grayer  [employer]

## 2017-03-17 LAB — BASIC METABOLIC PANEL
Anion gap: 7 (ref 5–15)
BUN: 23 mg/dL — ABNORMAL HIGH (ref 6–20)
CHLORIDE: 104 mmol/L (ref 101–111)
CO2: 28 mmol/L (ref 22–32)
Calcium: 9.1 mg/dL (ref 8.9–10.3)
Creatinine, Ser: 0.64 mg/dL (ref 0.44–1.00)
GFR calc non Af Amer: >60 mL/min (ref >60)
Glucose, Bld: 108 mg/dL — ABNORMAL HIGH (ref 65–99)
POTASSIUM: 4.3 mmol/L (ref 3.5–5.1)
SODIUM: 139 mmol/L (ref 135–145)

## 2017-03-17 MED ORDER — IPRATROPIUM-ALBUTEROL 0.5-2.5 (3) MG/3ML IN SOLN
3.0000 mL | Freq: Four times a day (QID) | RESPIRATORY_TRACT | Status: DC
  Administered 2017-03-17 (×3): 3 mL via RESPIRATORY_TRACT
  Filled 2017-03-17 (×3): qty 3

## 2017-03-17 MED ORDER — CARVEDILOL 3.125 MG PO TABS
3.1250 mg | ORAL_TABLET | ORAL | Status: AC
  Administered 2017-03-17: 3.125 mg via ORAL
  Filled 2017-03-17: qty 1

## 2017-03-17 MED ORDER — RIVAROXABAN 20 MG PO TABS
20.0000 mg | ORAL_TABLET | Freq: Every day | ORAL | Status: DC
  Administered 2017-03-17: 20 mg via ORAL
  Filled 2017-03-17: qty 1

## 2017-03-17 MED ORDER — FUROSEMIDE 20 MG PO TABS: 20.0000 mg | ORAL_TABLET | Freq: Every day | ORAL | 0 refills | Status: DC

## 2017-03-17 MED ORDER — PREDNISONE 20 MG PO TABS: ORAL_TABLET | ORAL | 0 refills | Status: DC

## 2017-03-17 MED ORDER — LOSARTAN POTASSIUM 25 MG PO TABS: 25.0000 mg | ORAL_TABLET | Freq: Every day | ORAL | 0 refills | Status: DC

## 2017-03-17 MED ORDER — MOMETASONE FURO-FORMOTEROL FUM 200-5 MCG/ACT IN AERO: 2.0000 | INHALATION_SPRAY | Freq: Two times a day (BID) | RESPIRATORY_TRACT | 0 refills | Status: AC

## 2017-03-17 MED ORDER — METOPROLOL TARTRATE 5 MG/5ML IV SOLN
5.0000 mg | Freq: Once | INTRAVENOUS | Status: AC
  Administered 2017-03-17: 5 mg via INTRAVENOUS
  Filled 2017-03-17: qty 5

## 2017-03-17 MED ORDER — RIVAROXABAN 20 MG PO TABS: 20.0000 mg | ORAL_TABLET | Freq: Every day | ORAL | 0 refills | Status: DC

## 2017-03-17 MED ORDER — CARVEDILOL 3.125 MG PO TABS: 3.1250 mg | ORAL_TABLET | Freq: Two times a day (BID) | ORAL | 0 refills | Status: DC

## 2017-03-17 NOTE — Progress Notes (Signed)
SUBJECTIVE: Patient is feeling much better   Vitals:   03/16/17 1924 03/16/17 2055 03/17/17 0239 03/17/17 0429  BP: 105/85   100/75  Pulse: (!) 130   (!) 108  Resp: 16   16  Temp: 98 F (36.7 C)   97.8 F (36.6 C)  TempSrc: Oral   Oral  SpO2: 93% 95% 94% 100%  Weight:    162 lb 6.4 oz (73.7 kg)  Height:        Intake/Output Summary (Last 24 hours) at 03/17/17 0851 Last data filed at 03/16/17 2100  Gross per 24 hour  Intake              480 ml  Output                0 ml  Net              480 ml    LABS: Basic Metabolic Panel:  Recent Labs  03/15/17 0516 03/17/17 0745  NA 136 139  K 4.7 4.3  CL 102 104  CO2 27 28  GLUCOSE 150* 108*  BUN 26* 23*  CREATININE 0.62 0.64  CALCIUM 9.1 9.1  MG 2.1  --   PHOS 5.0*  --    Liver Function Tests: No results for input(s): AST, ALT, ALKPHOS, BILITOT, PROT, ALBUMIN in the last 72 hours. No results for input(s): LIPASE, AMYLASE in the last 72 hours. CBC:  Recent Labs  03/15/17 0516  WBC 10.7  HGB 11.7*  HCT 34.5*  MCV 118.1*  PLT 286   Cardiac Enzymes: No results for input(s): CKTOTAL, CKMB, CKMBINDEX, TROPONINI in the last 72 hours. BNP: Invalid input(s): POCBNP D-Dimer: No results for input(s): DDIMER in the last 72 hours. Hemoglobin A1C: No results for input(s): HGBA1C in the last 72 hours. Fasting Lipid Panel: No results for input(s): CHOL, HDL, LDLCALC, TRIG, CHOLHDL, LDLDIRECT in the last 72 hours. Thyroid Function Tests: No results for input(s): TSH, T4TOTAL, T3FREE, THYROIDAB in the last 72 hours.  Invalid input(s): FREET3 Anemia Panel: No results for input(s): VITAMINB12, FOLATE, FERRITIN, TIBC, IRON, RETICCTPCT in the last 72 hours.   PHYSICAL EXAM General: Well developed, well nourished, in no acute distress HEENT:  Normocephalic and atramatic Neck:  No JVD.  Lungs: Clear bilaterally to auscultation and percussion. Heart: HRRR . Normal S1 and S2 without gallops or murmurs.  Abdomen: Bowel  sounds are positive, abdomen soft and non-tender  Msk:  Back normal, normal gait. Normal strength and tone for age. Extremities: No clubbing, cyanosis or edema.   Neuro: Alert and oriented X 3. Psych:  Good affect, responds appropriately  TELEMETRY:Sinus rhythm  ASSESSMENT AND PLAN: Congestive heart failure with shortness of breath and mildly elevated troponin due to demand ischemia. Advise adding losartan 25 Corag 3.125 twice a day and Lasix 20 mg once a day and discharged with follow-up in the office this Thursday at 10 AM.  Active Problems:   CHF (congestive heart failure) (HCC)   Shortness of breath   Elevated troponin    Neoma Laming A, MD, [facility] 03/17/2017 8:51 AM

## 2017-03-17 NOTE — Telephone Encounter (Signed)
Patient developed shortness of breath in the days prior to the CT scheduled for March 14. Could not lay supine for study. Admitted to [facility] that day for SOB/ CHF exacerbation.  She has been asked to call when she is feeling better, and we will reschedule the CT.

## 2017-03-17 NOTE — Plan of Care (Signed)
Problem: Pain Managment: Goal: General experience of comfort will improve Outcome: Progressing Oxygen increased to 4L per [place] to  maintain oxygenation.

## 2017-03-17 NOTE — Discharge Summary (Signed)
[facility] [place] [facility] [place] NAME: Shannon Schneider    MR#:  [id number]  DATE OF BIRTH:  02-Mar-1971  DATE OF ADMISSION:  03/12/2017 ADMITTING PHYSICIAN: Gladstone Lighter, MD  DATE OF DISCHARGE: 03/17/2017  4:29 PM  PRIMARY CARE PHYSICIAN: HOLLAND, [place], NP    ADMISSION DIAGNOSIS:  Shortness of breath [R06.02] Elevated troponin [R74.8] Congestive heart failure, unspecified congestive heart failure chronicity, unspecified congestive heart failure type ([place]) [I50.9]  DISCHARGE DIAGNOSIS:  Active Problems:   CHF (congestive heart failure) (HCC)   Shortness of breath   Elevated troponin   SECONDARY DIAGNOSIS:   Past Medical History:  Diagnosis Date  . Congestive heart failure (CHF) ([place]) 06/27/15  . COPD (chronic obstructive pulmonary disease) ([place]) 06/27/15  . Hyperbilirubinemia   . 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 Rectal mass   . Sickle cell anemia ([place])     HOSPITAL COURSE:   1.   Acute on chronic hypoxic respiratory failure with hypoxia. The patient initially was brought in on BiPAP and was not moving much air. The patient's was brought down to 4 L of oxygen and this is likely her new baseline oxygen level 2. Acute diastolic congestive heart failure with moderate to severe mitral regurgitation and severe pulmonary hypertension and anasarca. The patient's blood pressure is been on the lower side and very difficult to manage and balance things. The patient was diuresed with IV Lasix during the hospital course. I change the patient over to 20 mg of Lasix orally upon discharge home. The patient will be on low-dose Coreg. Add low-dose losartan upon discharge at night. 3. Relative hypotension. The patient's normal blood pressures in the 90s. Need to continue cardiac meds despite low blood pressure. 4. COPD exacerbation. Patient is moving better air upon discharge home. The patient was started on IV Solu-Medrol be switched over to prednisone 40 mg daily for  4 more days. Prescribed Dulera inhaler, Spiriva and albuterol inhaler. 5. Tobacco abuse patient must stop smoking 6. Toxic granulation seen on CBC. Unfortunately no blood culture sent on presentation. The patient initially was placed on Levaquin and then switched over to Zithromax by the critical care specialist. Elizebeth Koller course of Zithromax. 7. History of sickle cell anemia 8. History of rectal mass. Follow-up as outpatient 9. Fluid in the abdomen on exam and ultrasound. Patient refused paracentesis. 10. Jaundice. This is chronic for her and related to sickle cell disease 11. Atrial fibrillation with rapid ventricular response. Also atrial flutter. The patient was given an extra dose of Coreg on the day of discharge. Started on Xarelto blood thinner. Risk of bleeding explained to the patient.  Patient is high risk for readmission  DISCHARGE CONDITIONS:   Fair  CONSULTS OBTAINED:  Treatment Team:  Dionisio David, MD  DRUG ALLERGIES:  No Known Allergies  DISCHARGE MEDICATIONS:   Discharge Medication List as of 03/17/2017  3:52 PM    START taking these medications   Details  losartan (COZAAR) 25 MG tablet Take 1 tablet (25 mg total) by mouth at bedtime., Starting Mon 03/17/2017, Print    mometasone-formoterol (DULERA) 200-5 MCG/ACT AERO Inhale 2 puffs into the lungs 2 (two) times daily., Starting Mon 03/17/2017, Print    predniSONE (DELTASONE) 20 MG tablet 2 tablets daily for 4 days, Print    rivaroxaban (XARELTO) 20 MG TABS tablet Take 1 tablet (20 mg total) by mouth daily., Starting Mon 03/17/2017, Print      CONTINUE these medications which have CHANGED  Details  carvedilol (COREG) 3.125 MG tablet Take 1 tablet (3.125 mg total) by mouth 2 (two) times daily with a meal., Starting Mon 03/17/2017, Print    furosemide (LASIX) 20 MG tablet Take 1 tablet (20 mg total) by mouth daily., Starting Mon 03/17/2017, Print      CONTINUE these medications which have NOT CHANGED   Details   albuterol (PROVENTIL HFA;VENTOLIN HFA) 108 (90 Base) MCG/ACT inhaler Inhale 2 puffs into the lungs every 6 (six) hours as needed for wheezing., Starting Mon 09/16/2016, Historical Med    citalopram (CELEXA) 10 MG tablet Take 10 mg by mouth at bedtime., Historical Med    Ergocalciferol 2000 units TABS Take 2,000 Units by mouth daily. , Historical Med    loratadine (CLARITIN) 10 MG tablet Take 10 mg by mouth daily., Historical Med    Tiotropium Bromide Monohydrate (SPIRIVA RESPIMAT) 2.5 MCG/ACT AERS Inhale 1 puff into the lungs 2 (two) times daily., Starting Mon 05/13/2016, Historical Med    OXYGEN Inhale 2 L into the lungs continuous. , Historical Med      STOP taking these medications     budesonide-formoterol (SYMBICORT) 160-4.5 MCG/ACT inhaler      sacubitril-valsartan (ENTRESTO) 24-26 MG          DISCHARGE INSTRUCTIONS:   Follow-up with Dr. Humphrey Rolls on Thursday  If you experience worsening of your admission symptoms, develop shortness of breath, life threatening emergency, suicidal or homicidal thoughts you must seek medical attention immediately by calling 911 or calling your MD immediately  if symptoms less severe.  You Must read complete instructions/literature along with all the possible adverse reactions/side effects for all the Medicines you take and that have been prescribed to you. Take any new Medicines after you have completely understood and accept all the possible adverse reactions/side effects.   Please note  You were cared for by a hospitalist during your hospital stay. If you have any questions about your discharge medications or the care you received while you were in the hospital after you are discharged, you can call the unit and asked to speak with the hospitalist on call if the hospitalist that took care of you is not available. Once you are discharged, your primary care physician will handle any further medical issues. Please note that NO REFILLS for any discharge  medications will be authorized once you are discharged, as it is imperative that you return to your primary care physician (or establish a relationship with a primary care physician if you do not have one) for your aftercare needs so that they can reassess your need for medications and monitor your lab values.    Today   CHIEF COMPLAINT:   Chief Complaint  Patient presents with  . Shortness of Breath    HISTORY OF PRESENT ILLNESS:  Shannon Schneider  is a 46 y.o. female presented with shortness of breath   VITAL SIGNS:  Blood pressure (!) 101/59, pulse (!) 136, temperature 98.2 F (36.8 C), temperature source Oral, resp. rate 20, height 5' (1.524 m), weight 73.7 kg (162 lb 6.4 oz), last menstrual period 01/12/2017, SpO2 92 %. Heart rate in the 90s upon discharge   PHYSICAL EXAMINATION:  GENERAL:  46 y.o.-year-old patient lying in the bed with no acute distress.  EYES: Pupils equal, round, reactive to light and accommodation. No scleral icterus. Extraocular muscles intact.  HEENT: Head atraumatic, normocephalic. Oropharynx and nasopharynx clear.  NECK:  Supple, no jugular venous distention. No thyroid enlargement, no tenderness.  LUNGS: Normal  breath sounds bilaterally, no wheezing, rales,rhonchi or crepitation. No use of accessory muscles of respiration.  CARDIOVASCULAR: S1, S2 Irregularly irregular tachycardic. No murmurs, rubs, or gallops.  ABDOMEN: Soft, non-tender, non-distended. Bowel sounds present. No organomegaly or mass.  EXTREMITIES: No pedal edema, cyanosis, or clubbing.  NEUROLOGIC: Cranial nerves II through XII are intact. Muscle strength 5/5 in all extremities. Sensation intact. Gait not checked.  PSYCHIATRIC: The patient is alert and oriented x 3.  SKIN: No obvious rash, lesion, or ulcer.   DATA REVIEW:   CBC  Recent Labs Lab 03/15/17 0516  WBC 10.7  HGB 11.7*  HCT 34.5*  PLT 286    Chemistries   Recent Labs Lab 03/12/17 1314  03/15/17 0516 03/17/17 0745   NA 134*  < > 136 139  K 5.0  < > 4.7 4.3  CL 99*  < > 102 104  CO2 26  < > 27 28  GLUCOSE 112*  < > 150* 108*  BUN 20  < > 26* 23*  CREATININE 0.51  < > 0.62 0.64  CALCIUM 9.2  < > 9.1 9.1  MG  --   --  2.1  --   AST 24  --   --   --   ALT 11*  --   --   --   ALKPHOS 135*  --   --   --   BILITOT 9.4*  --   --   --   < > = values in this interval not displayed.  Cardiac Enzymes  Recent Labs Lab 03/13/17 0802  TROPONINI 0.05*    Microbiology Results  Results for orders placed or performed during the hospital encounter of 03/12/17  MRSA PCR Screening     Status: None   Collection Time: 03/12/17  8:47 PM  Result Value Ref Range Status   MRSA by PCR NEGATIVE NEGATIVE Final    Comment:        The GeneXpert MRSA Assay (FDA approved for NASAL specimens only), is one component of a comprehensive MRSA colonization surveillance program. It is not intended to diagnose MRSA infection nor to guide or monitor treatment for MRSA infections.     Management plans discussed with the patient, family and they are in agreement.  CODE STATUS:     Code Status Orders        Start     Ordered   03/12/17 1933  Full code  Continuous     03/12/17 1932    Code Status History    Date Active Date Inactive Code Status Order ID Comments User Context   12/30/2016  4:15 PM 01/02/2017  7:09 PM Full Code [id number]  Hillary Bow, MD ED   06/27/2015  1:31 PM 06/29/2015  5:59 PM Full Code [id number]  Henreitta Leber, MD Inpatient      TOTAL TIME TAKING CARE OF THIS PATIENT: 40 minutes.    Loletha Grayer M.D on 03/17/2017 at 5:46 PM  Between 7am to 6pm - Pager - [phone]  After 6pm go to [email or web address] - password [email or web address]  [facility] Office  [phone]  CC: Primary care physician; Kasandra Knudsen, NP

## 2017-03-17 NOTE — Progress Notes (Signed)
Pts HR is sustaining 120's-130s a-fib. MD orders to give another dose of coreg. I will continue to monitor.

## 2017-03-17 NOTE — Care Management (Signed)
patient declines need for any services at discharge but she is agreeable to referral to HF clinic.  Has access to scales.  Provided her with brochure for HF Clinic.  There are no transportation issues that would prevent patient from accessing the clinic

## 2017-03-17 NOTE — Progress Notes (Signed)
Patient asked what her O2 was set at. Nurse informed patient that O2 was set at 5 lpm. Patient requested that O2 be reduced to home setting of 2-3 lpm. Nurse did a trial reduction of O2 to 3 lpm and checked patient's SpO2. SpO2 reading was 88%. Patient O2 readjusted to 5 lpm and patient educated about the need for her O2 to be at that setting (per MD order - O2 is to be administered if SpO2 is <94%)

## 2017-03-26 ENCOUNTER — Ambulatory Visit: Payer: BLUE CROSS/BLUE SHIELD | Admitting: Family

## 2017-03-26 ENCOUNTER — Telehealth: Payer: Self-pay | Admitting: Family

## 2017-03-26 NOTE — Telephone Encounter (Signed)
Patient missed her initial appointment at the [facility] on 03/26/17. Will attempt to reschedule.

## 2017-04-14 ENCOUNTER — Emergency Department
Admit: 2017-04-14 | Discharge: 2017-04-14 | Disposition: A | Discharge status: Home / Self Care | Payer: BLUE CROSS/BLUE SHIELD | Attending: Emergency Medicine | Admitting: Emergency Medicine

## 2017-04-14 ENCOUNTER — Emergency Department: Payer: BLUE CROSS/BLUE SHIELD

## 2017-04-14 ENCOUNTER — Inpatient Hospital Stay: Payer: BLUE CROSS/BLUE SHIELD

## 2017-04-14 ENCOUNTER — Inpatient Hospital Stay
Admission: EM | Admit: 2017-04-14 | Discharge: 2017-04-24 | DRG: 291 | Disposition: A | Discharge status: Skilled Nursing Facility | Payer: BLUE CROSS/BLUE SHIELD | Attending: Internal Medicine | Admitting: Internal Medicine

## 2017-04-14 DIAGNOSIS — I480 Paroxysmal atrial fibrillation: Secondary | ICD-10-CM | POA: Diagnosis present

## 2017-04-14 DIAGNOSIS — R0602 Shortness of breath: Secondary | ICD-10-CM | POA: Diagnosis present

## 2017-04-14 DIAGNOSIS — I2729 Other secondary pulmonary hypertension: Secondary | ICD-10-CM | POA: Diagnosis present

## 2017-04-14 DIAGNOSIS — J9600 Acute respiratory failure, unspecified whether with hypoxia or hypercapnia: Secondary | ICD-10-CM

## 2017-04-14 DIAGNOSIS — Z7952 Long term (current) use of systemic steroids: Secondary | ICD-10-CM | POA: Diagnosis not present

## 2017-04-14 DIAGNOSIS — Z9981 Dependence on supplemental oxygen: Secondary | ICD-10-CM | POA: Diagnosis not present

## 2017-04-14 DIAGNOSIS — R188 Other ascites: Secondary | ICD-10-CM | POA: Diagnosis not present

## 2017-04-14 DIAGNOSIS — J449 Chronic obstructive pulmonary disease, unspecified: Secondary | ICD-10-CM | POA: Diagnosis not present

## 2017-04-14 DIAGNOSIS — I5033 Acute on chronic diastolic (congestive) heart failure: Secondary | ICD-10-CM | POA: Diagnosis present

## 2017-04-14 DIAGNOSIS — Z7901 Long term (current) use of anticoagulants: Secondary | ICD-10-CM | POA: Diagnosis not present

## 2017-04-14 DIAGNOSIS — I4892 Unspecified atrial flutter: Secondary | ICD-10-CM | POA: Diagnosis present

## 2017-04-14 DIAGNOSIS — J9690 Respiratory failure, unspecified, unspecified whether with hypoxia or hypercapnia: Secondary | ICD-10-CM

## 2017-04-14 DIAGNOSIS — I2781 Cor pulmonale (chronic): Secondary | ICD-10-CM

## 2017-04-14 DIAGNOSIS — Z7951 Long term (current) use of inhaled steroids: Secondary | ICD-10-CM | POA: Diagnosis not present

## 2017-04-14 DIAGNOSIS — R748 Abnormal levels of other serum enzymes: Secondary | ICD-10-CM

## 2017-04-14 DIAGNOSIS — F17210 Nicotine dependence, cigarettes, uncomplicated: Secondary | ICD-10-CM | POA: Diagnosis present

## 2017-04-14 DIAGNOSIS — I248 Other forms of acute ischemic heart disease: Secondary | ICD-10-CM | POA: Diagnosis present

## 2017-04-14 DIAGNOSIS — D571 Sickle-cell disease without crisis: Secondary | ICD-10-CM

## 2017-04-14 DIAGNOSIS — I272 Pulmonary hypertension, unspecified: Secondary | ICD-10-CM | POA: Diagnosis not present

## 2017-04-14 DIAGNOSIS — Z79899 Other long term (current) drug therapy: Secondary | ICD-10-CM | POA: Diagnosis not present

## 2017-04-14 DIAGNOSIS — K629 Disease of anus and rectum, unspecified: Secondary | ICD-10-CM | POA: Diagnosis present

## 2017-04-14 DIAGNOSIS — J9621 Acute and chronic respiratory failure with hypoxia: Secondary | ICD-10-CM

## 2017-04-14 DIAGNOSIS — R570 Cardiogenic shock: Secondary | ICD-10-CM | POA: Diagnosis present

## 2017-04-14 DIAGNOSIS — E871 Hypo-osmolality and hyponatremia: Secondary | ICD-10-CM | POA: Diagnosis present

## 2017-04-14 DIAGNOSIS — I509 Heart failure, unspecified: Secondary | ICD-10-CM

## 2017-04-14 DIAGNOSIS — I5021 Acute systolic (congestive) heart failure: Secondary | ICD-10-CM

## 2017-04-14 DIAGNOSIS — I313 Pericardial effusion (noninflammatory): Secondary | ICD-10-CM | POA: Diagnosis present

## 2017-04-14 DIAGNOSIS — R609 Edema, unspecified: Secondary | ICD-10-CM

## 2017-04-14 DIAGNOSIS — D649 Anemia, unspecified: Secondary | ICD-10-CM

## 2017-04-14 DIAGNOSIS — N179 Acute kidney failure, unspecified: Secondary | ICD-10-CM | POA: Diagnosis present

## 2017-04-14 DIAGNOSIS — I429 Cardiomyopathy, unspecified: Secondary | ICD-10-CM | POA: Diagnosis present

## 2017-04-14 DIAGNOSIS — I081 Rheumatic disorders of both mitral and tricuspid valves: Secondary | ICD-10-CM | POA: Diagnosis present

## 2017-04-14 DIAGNOSIS — R0600 Dyspnea, unspecified: Secondary | ICD-10-CM

## 2017-04-14 DIAGNOSIS — K761 Chronic passive congestion of liver: Secondary | ICD-10-CM | POA: Diagnosis present

## 2017-04-14 DIAGNOSIS — E873 Alkalosis: Secondary | ICD-10-CM | POA: Diagnosis not present

## 2017-04-14 LAB — CBC WITH DIFFERENTIAL/PLATELET
BASOS PCT: 0 %
Band Neutrophils: 0 %
Basophils Absolute: 0 10*3/uL (ref 0–0.1)
Blasts: 0 %
EOS PCT: 0 %
Eosinophils Absolute: 0 10*3/uL (ref 0–0.7)
HCT: 25.8 % — ABNORMAL LOW (ref 35.0–47.0)
Hemoglobin: 8.9 g/dL — ABNORMAL LOW (ref 12.0–16.0)
LYMPHS ABS: 1.2 10*3/uL (ref 1.0–3.6)
LYMPHS PCT: 11 %
MCH: 40.2 pg — AB (ref 26.0–34.0)
MCHC: 34.6 g/dL (ref 32.0–36.0)
MCV: 116.4 fL — AB (ref 80.0–100.0)
MONO ABS: 0.8 10*3/uL (ref 0.2–0.9)
MONOS PCT: 7 %
Metamyelocytes Relative: 0 %
Myelocytes: 0 %
NEUTROS ABS: 8.8 10*3/uL — AB (ref 1.4–6.5)
NRBC: 41 /100{WBCs} — AB
Neutrophils Relative %: 82 %
OTHER: 0 %
PLATELETS: 274 10*3/uL (ref 150–440)
Promyelocytes Absolute: 0 %
RBC: 2.22 MIL/uL — ABNORMAL LOW (ref 3.80–5.20)
RDW: 22.4 % — AB (ref 11.5–14.5)
WBC: 10.8 10*3/uL (ref 3.6–11.0)

## 2017-04-14 LAB — COMPREHENSIVE METABOLIC PANEL
ALBUMIN: 3.1 g/dL — AB (ref 3.5–5.0)
ALT: 8 U/L — ABNORMAL LOW (ref 14–54)
AST: 26 U/L (ref 15–41)
Alkaline Phosphatase: 92 U/L (ref 38–126)
Anion gap: 9 (ref 5–15)
BILIRUBIN TOTAL: 6.4 mg/dL — AB (ref 0.3–1.2)
BUN: 28 mg/dL — AB (ref 6–20)
CO2: 17 mmol/L — AB (ref 22–32)
Calcium: 7.5 mg/dL — ABNORMAL LOW (ref 8.9–10.3)
Chloride: 106 mmol/L (ref 101–111)
Creatinine, Ser: 1.29 mg/dL — ABNORMAL HIGH (ref 0.44–1.00)
GFR calc Af Amer: 57 mL/min — ABNORMAL LOW (ref >60)
GFR calc non Af Amer: 49 mL/min — ABNORMAL LOW (ref >60)
GLUCOSE: 91 mg/dL (ref 65–99)
POTASSIUM: 5 mmol/L (ref 3.5–5.1)
SODIUM: 132 mmol/L — AB (ref 135–145)
TOTAL PROTEIN: 5.7 g/dL — AB (ref 6.5–8.1)

## 2017-04-14 LAB — URINALYSIS, COMPLETE (UACMP) WITH MICROSCOPIC
Bilirubin Urine: NEGATIVE
Glucose, UA: NEGATIVE mg/dL
Hgb urine dipstick: NEGATIVE
Ketones, ur: NEGATIVE mg/dL
Leukocytes, UA: NEGATIVE
Nitrite: NEGATIVE
Protein, ur: NEGATIVE mg/dL
Specific Gravity, Urine: 1.008 (ref 1.005–1.030)
pH: 5 (ref 5.0–8.0)

## 2017-04-14 LAB — TROPONIN I
Troponin I: <0.03 ng/mL (ref <0.03)
Troponin I: 0.13 ng/mL (ref <0.03)

## 2017-04-14 LAB — MRSA PCR SCREENING: MRSA by PCR: NEGATIVE

## 2017-04-14 LAB — PREGNANCY, URINE: PREG TEST UR: NEGATIVE

## 2017-04-14 LAB — GLUCOSE, CAPILLARY: GLUCOSE-CAPILLARY: 107 mg/dL — AB (ref 65–99)

## 2017-04-14 LAB — BRAIN NATRIURETIC PEPTIDE: B Natriuretic Peptide: 1145 pg/mL — ABNORMAL HIGH (ref 0.0–100.0)

## 2017-04-14 MED ORDER — ONDANSETRON HCL 4 MG PO TABS: 4.0000 mg | ORAL_TABLET | Freq: Four times a day (QID) | ORAL | Status: DC | PRN

## 2017-04-14 MED ORDER — TIOTROPIUM BROMIDE MONOHYDRATE 18 MCG IN CAPS
1.0000 | ORAL_CAPSULE | Freq: Every day | RESPIRATORY_TRACT | Status: DC
  Administered 2017-04-15 – 2017-04-24 (×10): 18 ug via RESPIRATORY_TRACT
  Filled 2017-04-14 (×2): qty 5

## 2017-04-14 MED ORDER — CHLORHEXIDINE GLUCONATE 0.12 % MT SOLN
15.0000 mL | Freq: Two times a day (BID) | OROMUCOSAL | Status: DC
  Administered 2017-04-15 – 2017-04-24 (×19): 15 mL via OROMUCOSAL
  Filled 2017-04-14 (×16): qty 15

## 2017-04-14 MED ORDER — FUROSEMIDE 10 MG/ML IJ SOLN
4.0000 mg/h | INTRAVENOUS | Status: DC
  Administered 2017-04-14: 10 mg/h via INTRAVENOUS
  Filled 2017-04-14: qty 25

## 2017-04-14 MED ORDER — DOPAMINE-DEXTROSE 3.2-5 MG/ML-% IV SOLN
2.0000 ug/kg/min | INTRAVENOUS | Status: DC
  Administered 2017-04-14 – 2017-04-16 (×2): 2 ug/kg/min via INTRAVENOUS
  Administered 2017-04-17: 3 ug/kg/min via INTRAVENOUS
  Filled 2017-04-14 (×2): qty 250

## 2017-04-14 MED ORDER — SODIUM CHLORIDE 0.9% FLUSH
3.0000 mL | Freq: Two times a day (BID) | INTRAVENOUS | Status: DC
  Administered 2017-04-14 – 2017-04-15 (×3): 3 mL via INTRAVENOUS
  Administered 2017-04-16: 10 mL via INTRAVENOUS
  Administered 2017-04-16 – 2017-04-22 (×9): 3 mL via INTRAVENOUS

## 2017-04-14 MED ORDER — ORAL CARE MOUTH RINSE
15.0000 mL | Freq: Two times a day (BID) | OROMUCOSAL | Status: DC
  Administered 2017-04-15 – 2017-04-24 (×11): 15 mL via OROMUCOSAL

## 2017-04-14 MED ORDER — MOMETASONE FURO-FORMOTEROL FUM 200-5 MCG/ACT IN AERO
2.0000 | INHALATION_SPRAY | Freq: Two times a day (BID) | RESPIRATORY_TRACT | Status: DC
  Administered 2017-04-14 – 2017-04-24 (×20): 2 via RESPIRATORY_TRACT
  Filled 2017-04-14: qty 8.8

## 2017-04-14 MED ORDER — ONDANSETRON HCL 4 MG/2ML IJ SOLN
INTRAMUSCULAR | Status: AC
  Administered 2017-04-14: 4 mg
  Filled 2017-04-14: qty 2

## 2017-04-14 MED ORDER — ACETAMINOPHEN 325 MG PO TABS
650.0000 mg | ORAL_TABLET | Freq: Four times a day (QID) | ORAL | Status: DC | PRN
  Administered 2017-04-14 – 2017-04-15 (×2): 650 mg via ORAL
  Filled 2017-04-14 (×2): qty 2

## 2017-04-14 MED ORDER — SODIUM CHLORIDE 0.9 % IV SOLN: 250.0000 mL | INTRAVENOUS | Status: DC | PRN

## 2017-04-14 MED ORDER — SODIUM CHLORIDE 0.9% FLUSH: 3.0000 mL | INTRAVENOUS | Status: DC | PRN

## 2017-04-14 MED ORDER — ONDANSETRON HCL 4 MG/2ML IJ SOLN
4.0000 mg | Freq: Four times a day (QID) | INTRAMUSCULAR | Status: DC | PRN
  Administered 2017-04-15: 4 mg via INTRAVENOUS
  Filled 2017-04-14: qty 2

## 2017-04-14 MED ORDER — CITALOPRAM HYDROBROMIDE 10 MG PO TABS
10.0000 mg | ORAL_TABLET | Freq: Every day | ORAL | Status: DC
  Administered 2017-04-15 – 2017-04-23 (×9): 10 mg via ORAL
  Filled 2017-04-14 (×9): qty 1

## 2017-04-14 MED ORDER — RIVAROXABAN 20 MG PO TABS: 20.0000 mg | ORAL_TABLET | Freq: Every day | ORAL | Status: DC

## 2017-04-14 MED ORDER — BISACODYL 5 MG PO TBEC
5.0000 mg | DELAYED_RELEASE_TABLET | Freq: Every day | ORAL | Status: DC | PRN
  Administered 2017-04-19: 5 mg via ORAL
  Filled 2017-04-14: qty 1

## 2017-04-14 MED ORDER — SODIUM CHLORIDE 0.9% FLUSH
3.0000 mL | Freq: Two times a day (BID) | INTRAVENOUS | Status: DC
  Administered 2017-04-14 – 2017-04-18 (×8): 3 mL via INTRAVENOUS

## 2017-04-14 MED ORDER — LORATADINE 10 MG PO TABS
10.0000 mg | ORAL_TABLET | Freq: Every day | ORAL | Status: DC
  Administered 2017-04-15: 10 mg via ORAL
  Filled 2017-04-14: qty 1

## 2017-04-14 MED ORDER — ACETAMINOPHEN 650 MG RE SUPP: 650.0000 mg | Freq: Four times a day (QID) | RECTAL | Status: DC | PRN

## 2017-04-14 MED ORDER — VITAMIN D 1000 UNITS PO TABS
2000.0000 [IU] | ORAL_TABLET | Freq: Every day | ORAL | Status: DC
  Administered 2017-04-15 – 2017-04-24 (×10): 2000 [IU] via ORAL
  Filled 2017-04-14 (×10): qty 2

## 2017-04-14 MED ORDER — SENNOSIDES-DOCUSATE SODIUM 8.6-50 MG PO TABS
1.0000 | ORAL_TABLET | Freq: Every evening | ORAL | Status: DC | PRN
  Administered 2017-04-18: 1 via ORAL
  Filled 2017-04-14: qty 1

## 2017-04-14 NOTE — Progress Notes (Signed)
MEDICATION RELATED CONSULT NOTE - INITIAL   Pharmacy Consult for electrolyte monitoring and replacement if needed  No Known Allergies  Patient Measurements: Height: 5' (152.4 cm) Weight: 180 lb (81.6 kg) IBW/kg (Calculated) : 45.5  Vital Signs: Temp: 98 F (36.7 C) (04/16 1612) Temp Source: Oral (04/16 1612) BP: 80/56 (04/16 1755) Pulse Rate: 73 (04/16 1755) Intake/Output from previous day: No intake/output data recorded. Intake/Output from this shift: No intake/output data recorded.  Labs:  Recent Labs  04/14/17 1622  WBC 10.8  HGB 8.9*  HCT 25.8*  PLT 274  CREATININE 1.29*  ALBUMIN 3.1*  PROT 5.7*  AST 26  ALT 8*  ALKPHOS 92  BILITOT 6.4*   Estimated Creatinine Clearance: 51.5 mL/min (A) (by C-G formula based on SCr of 1.29 mg/dL (H)).   Microbiology: No results found for this or any previous visit (from the past 720 hour(s)).  Medical History: Past Medical History:  Diagnosis Date  . Congestive heart failure (CHF) ([place]) 06/27/15  . COPD (chronic obstructive pulmonary disease) ([place]) 06/27/15  . Hyperbilirubinemia   . 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 Rectal mass   . Sickle cell anemia [facility])     Assessment: Pharmacy consulted for electrolyte management.  K = 5 in ED. No supplementation needed.   Goal of Therapy:  Electrolytes WNL  Plan:  Will recheck electrolytes with AM labs tomorrow.  Lenis Noon , PharmD 04/14/2017,6:35 PM

## 2017-04-14 NOTE — ED Triage Notes (Signed)
Pt arrived via ems for c/o shortness of breath - pt has a history of CHF, COPD, sickle cell and afib - pt went to MD Friday and Lasix was increased to 40mg  per day - pt continues with shortness of breath and swelling in abe and bilat lower ext - abd is tight and bilat legs +3 pitting edema - lungs are clear in all lobes at this time - whites of eyes appear yellow - Dr Cinda Quest at bedside evaluating pt

## 2017-04-14 NOTE — ED Notes (Signed)
Pt o2 sat 82-85% on 5L of O2 via n/c - changed to 15L nonrebreather and O2 sat increased to 93% - Dr Cinda Quest notified and per MD RT paged to place pt on bi-pap

## 2017-04-14 NOTE — Consult Note (Signed)
Name: Shannon Schneider MRN: [id number] DOB: 1971-08-22    ADMISSION DATE:  04/14/2017 CONSULTATION DATE:  04/14/2017  REFERRING MD :  Dr. Benjie Karvonen   CHIEF COMPLAINT: Shortness of Breath   BRIEF PATIENT DESCRIPTION:  46 yo female presented to [facility] ER 04/16 with acute on chronic hypoxic respiratory failure secondary to CHF exacerbation, and ascites, hypotension requiring pressors, and acute renal failure.  SIGNIFICANT EVENTS  04/16-Pt admitted to ICU and PCCM consulted for management   STUDIES:  Echo 04/16>> NM Pulmonary Perf and Vent>> Bilateral lower extremity US 04/16>>No evidence of DVT, bilateral lower extremities. Ill-defined edema within the subcutaneous soft tissues of both lower extremities, most prominent at the level of the calves.  HISTORY OF PRESENT ILLNESS:   This is a 46 yo female with a PMH of Sickle cell anemia, Rectal mass, COPD, O2 dependent 2L via nasal canula, Atrial fibb/flutter, Hyperbilirubinemia, Chronic diastolic CHF, Cardiomyopathy EF 35%-40%, Severe pulmonary hypertension, and Severe mitral and tricuspid regurgitation.  She presented to [facility] ER 04/16 with c/o worsening bilateral lower extremity edema, increased abdominal girth, shortness of breath, PND, and orthopnea onset 3 week ago.  Due to symptoms she saw her cardiologist 04/13 who increased her lasix dose from 20 mg to 40 mg daily, however her symptoms did not improve prompting current ER visit.  She stated she noticed an increase in her abdominal girth at the end of January.  She also endorses a significant decrease in urinary output only voiding a small amount of urine once a day which began on 04/13.  Her family states over the past 3 weeks she has developed orthopnea and at times they have found her in a tripod position due to shortness of breath. Upon arrival to the ER it was noted the pt was hypotensive with systolic bp ranging between 70-80's her normal systolic is in the 19'Q.  In the ER she complained of nausea  with vomiting she endorses having black tarry stools at home since she started taking xarelto.  CXR revealed increasing right pleural effusion, elevated BNP 1,145, and bedside echo revealed pericardial effusion but no right side collapse and no tamponade.  ER physician notified cardiologist Dr. Humphrey Rolls of echo findings who recommended placing the pt on low dose dopamine gtt to maintain systolic bp 90 or above. She was subsequently admitted to the ICU for further workup and treatment.  Her outpatient pulmonologist is Dr. Raul Del.    PAST MEDICAL HISTORY :   has a past medical history of Congestive heart failure (CHF) ([place]) (06/27/15); COPD (chronic obstructive pulmonary disease) ([place]) (06/27/15); Hyperbilirubinemia; Oxygen dependent (06/27/15); Rectal mass; and Sickle cell anemia ([place]).  has a past surgical history that includes Cesarean section (1989); Cholecystectomy (1996); and EUS (N/A, 07/13/2015). Prior to Admission medications   Medication Sig Start Date End Date Taking? Authorizing Provider  albuterol (PROVENTIL HFA;VENTOLIN HFA) 108 (90 Base) MCG/ACT inhaler Inhale 2 puffs into the lungs every 6 (six) hours as needed for wheezing. 09/16/16   Historical Provider, MD  carvedilol (COREG) 3.125 MG tablet Take 1 tablet (3.125 mg total) by mouth 2 (two) times daily with a meal. 03/17/17   Loletha Grayer, MD  citalopram (CELEXA) 10 MG tablet Take 10 mg by mouth at bedtime.    Historical Provider, MD  Ergocalciferol 2000 units TABS Take 2,000 Units by mouth daily.     Historical Provider, MD  furosemide (LASIX) 20 MG tablet Take 1 tablet (20 mg total) by mouth daily. 03/17/17   Richard Leslye Peer,  MD  loratadine (CLARITIN) 10 MG tablet Take 10 mg by mouth daily.    Historical Provider, MD  losartan (COZAAR) 25 MG tablet Take 1 tablet (25 mg total) by mouth at bedtime. 03/17/17   Loletha Grayer, MD  mometasone-formoterol [facility]) 200-5 MCG/ACT AERO Inhale 2 puffs into the lungs 2 (two) times daily. 03/17/17    Loletha Grayer, MD  OXYGEN Inhale 2 L into the lungs continuous.     Historical Provider, MD  predniSONE (DELTASONE) 20 MG tablet 2 tablets daily for 4 days 03/17/17   Loletha Grayer, MD  rivaroxaban (XARELTO) 20 MG TABS tablet Take 1 tablet (20 mg total) by mouth daily. 03/17/17   Loletha Grayer, MD  Tiotropium Bromide Monohydrate (SPIRIVA RESPIMAT) 2.5 MCG/ACT AERS Inhale 1 puff into the lungs 2 (two) times daily. 05/13/16   Historical Provider, MD   No Known Allergies  FAMILY HISTORY:  family history includes Diabetes in her mother; Emphysema in her father; Hypertension in her mother; Renal Disease in her mother. SOCIAL HISTORY:  reports that she has been smoking Cigarettes.  She has a 5.00 pack-year smoking history. She has never used smokeless tobacco. She reports that she drinks alcohol. She reports that she does not use drugs.  REVIEW OF SYSTEMS:  Positives in BOLD  Constitutional: fever, chills, weight loss, malaise/fatigue and diaphoresis.  HENT: Negative for hearing loss, ear pain, nosebleeds, congestion, sore throat, neck pain, tinnitus and ear discharge.   Eyes: Negative for blurred vision, double vision, photophobia, pain, discharge and redness.  Respiratory: cough, hemoptysis, sputum production, shortness of breath, wheezing and stridor.   Cardiovascular: chest pain, palpitations, orthopnea, claudication, leg swelling and PND.  Gastrointestinal: heartburn, nausea, vomiting, abdominal pain and distension, diarrhea, constipation, blood in stool and melena.  Genitourinary: Negative for dysuria, urgency, frequency, hematuria and flank pain.  Musculoskeletal: myalgias, back pain, joint pain and falls.  Skin: Negative for itching and rash.  Neurological: Negative for dizziness, tingling, tremors, sensory change, speech change, focal weakness, seizures, loss of consciousness, weakness and headaches.  Endo/Heme/Allergies: Negative for environmental allergies and polydipsia. Does not  bruise/bleed easily.  SUBJECTIVE:  Currently on 40% Bipap she states her breathing has improved significantly   VITAL SIGNS: Temp:  [98 F (36.7 C)] 98 F (36.7 C) (04/16 1612) Pulse Rate:  [70-100] 74 (04/16 1830) Resp:  [16-25] 23 (04/16 1830) BP: (72-81)/(47-63) 77/47 (04/16 1830) SpO2:  [90 %-100 %] 90 % (04/16 1830) Weight:  [81.6 kg (180 lb)] 81.6 kg (180 lb) (04/16 1611)  PHYSICAL EXAMINATION: General: chronically ill appearing AA female on BIpap  Neuro: alert and oriented, follows commands HEENT: supple, no JVD Cardiovascular: nsr, s1s2, no murmur Lungs: crackles bilateral bases, even, non labored on Bipap Abdomen: distended, ascites, +BS x4, soft, taut, slightly tender Musculoskeletal: 4+ bilateral pitting lower extremity edema  Skin: intact no rashes or lesions   Recent Labs Lab 04/14/17 1622  NA 132*  K 5.0  CL 106  CO2 17*  BUN 28*  CREATININE 1.29*  GLUCOSE 91    Recent Labs Lab 04/14/17 1622  HGB 8.9*  HCT 25.8*  WBC 10.8  PLT 274   Dg Chest Portable 1 View  Result Date: 04/14/2017 CLINICAL DATA:  Shortness of breath.  Lower extremity swelling. EXAM: PORTABLE CHEST 1 VIEW COMPARISON:  03/12/2017. FINDINGS: Marked enlargement of cardiac silhouette. Vascular congestion is improved. Increasing RIGHT pleural effusion. No consolidation. IMPRESSION: Improved CHF, but increasing RIGHT pleural effusion. Marked enlargement cardiac silhouette is stable. Electronically Signed  By: Staci Righter M.D.   On: 04/14/2017 16:51    ASSESSMENT / PLAN: Acute on chronic hypoxic respiratory failure secondary to CHF exacerbation and severe pulmonary htn Ascites likely secondary to pulmonary htn Pericardial effusion no right sided collapse   Acute on chronic hyperbilirubin  Hypotension likely cardiogenic  Moderate to severe mitral and tricuspid regurgitation  Acute renal failure likely secondary to hypotension Nausea and Vomiting  Acute on chronic anemia  Hx:  Current everyday smoker, COPD, Chronic home O2 2L via nasal canula, Atrial fibb/flutter, Pericardial effusion, and Sickle cell anemia  P: Supplemental O2 to maintain O2 sats 88% to 92% or for dyspnea Continue outpatient inhalers  IR paracentesis pending Trend troponin's Cardiology and Gastroenterology consulted appreciate input  Dopamine gtt to maintain systolic bp 90 or above per Cardiology recommendations Lasix gtt 4 mg/hr for now will discontinue if pt remains hypotensive Hold outpatient metoprolol, metolazone, and aldactone  NM Pulmonary Perf and Vent pending-I do not think respiratory failure is secondary to pulmonary embolism pt has been on outpatient xarelto IR Abdomen US Limited pending Acute hepatitis panel pending  Trend CMP Replace electrolytes as indicated Monitor UOP  Prn Zofran  Trend CBC and PCT's UA and urine culture pending  Will hold xarelto for now until paracentesis completed  Monitor for s/sx of bleeding Transfuse for hgb <7 Sickle cell disease stable managed by [place] Provided smoking cessation counseling  Marda Stalker, [place] Pager [phone] (please enter 7 digits) PCCM Consult Pager [phone] (please enter 7 digits)   PCCM ATTENDING ATTESTATION:  I have evaluated patient with the APP [place], reviewed database in its entirety and discussed care plan in detail. In addition, this patient was discussed on multidisciplinary rounds.   Important exam findings: Very frail appearing No overt respiratory distress but requiring oxygen at 4-6 L/m with borderline saturations No wheezes Harsh systolic murmur across the precordium Abdomen distended with shifting dullness Symmetric 3+ pretibial edema  Chest x-ray: Cardiomegaly with very mild edema pattern  Impression: Acute on chronic hypoxemic respiratory failure Smoker COPD Severe pulmonary hypertension Decompensated right ventricular failure Paroxysmal atrial  fibrillation - chronic anticoagulation with rivaroxaban Sickle cell anemia Acute kidney injury  Discussion: I think the major cause of her acute illness is decompensated right ventricular failure due to severe pulmonary hypertension. If you review this series of echocardiograms since last year, they seem to reveal progressive dilatation of the right ventricle and right atrium. The current pulmonary artery pressure is markedly elevated. Her exam is consistent with severe right ventricular failure which would explain elevated liver function tests (hepatic congestion). The acute kidney injury is likely due to low flow state, again explained by decompensated right ventricular failure.   The pulmonary hypertension/right ventricular failure is likely mostly due to to sickle cell anemia with some contribution due to COPD  PLAN/REC: I counseled her regarding the need for absolute and complete abstinence from cigarettes and all smoking With pulmonary hypertension, we have to avoid even brief episodes of mild hypoxemia. Titrate O2 to maintain SpO2 > 93% I initiated sildenafil for pulmonary hypertension  I have reviewed and adjusted her bronchodilator regimen I have discussed with Dr. Raul Del, her pulmonologist as an outpatient  At some point, it might be appropriate to refer her to [facility] for further evaluation of [place] Paracentesis is planned for today. If this is a transudate (as expected) that would lend further support to my impression above  Merton Border, MD PCCM service [place] (  [phone] Pager [phone] 04/15/2017 1:57 PM

## 2017-04-14 NOTE — ED Notes (Addendum)
Pt is vomiting and c/o severe right side pain - reported to Dr Cinda Quest

## 2017-04-14 NOTE — ED Notes (Signed)
Per Dr Cinda Quest dopamine increased to titrate until BP systolic is in the 44'D

## 2017-04-14 NOTE — ED Notes (Signed)
Per Dr Cinda Quest if pt systolic BP below 72 to notify him and he will determine if dopamine needs to be started

## 2017-04-14 NOTE — H&P (Addendum)
[place] [facility] [place] NAME: Shannon Schneider    MR#:  [id number]  DATE OF BIRTH:  Aug 14, 1971  DATE OF ADMISSION:  04/14/2017  PRIMARY CARE PHYSICIAN: BOSWELL, CHELSA H, NP   REQUESTING/REFERRING PHYSICIAN: dr Cinda Quest  CHIEF COMPLAINT:    Increasing abdominal girth HISTORY OF PRESENT ILLNESS:  Shannon Schneider  is a 46 y.o. female with a known history of chronic diastolic heart failure EF 70% and severe pulmonary HTN/ moderate to severe mitral regurgitation and severe tricuspid regurgitation, COPD with chronic 2 L of oxygen who presents with above complaint. Patient reports over the past week she has had increasing lower extremity edema, abdominal girth, shortness of breath, dyspnea exertion, PND and orthopnea. She saw her cardiologist on Friday who increased her diuretic. Her normal blood pressure systolic is around 68T. In the emergency room her systolic blood pressures have been 70s to 80s. She reports she is compliant with her medications and her diet. Since she has been in the ER she is also had nausea and vomiting. It is noted that she did not follow up with the CHF clinic at the end of March.  Chest x-ray shows increasing right pleural effusion. BNP is elevated.  Bedside echocardiogram was performed in the emergency room which is showing right side of heart not collapsing,but no tamponade, but right atrium/ventricle not moving much.  PAST MEDICAL HISTORY:   Past Medical History:  Diagnosis Date  . Congestive heart failure (CHF) ([place]) 06/27/15  . COPD (chronic obstructive pulmonary disease) ([place]) 06/27/15  . Hyperbilirubinemia   . 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 Rectal mass   . Sickle cell anemia ([facility])     PAST SURGICAL HISTORY:   Past Surgical History:  Procedure Laterality Date  . CESAREAN SECTION  1989  . CHOLECYSTECTOMY  1996  . EUS N/A 07/13/2015   Procedure: LOWER ENDOSCOPIC ULTRASOUND (EUS);  Surgeon: Cora Daniels, MD;  Location:  [facility] ENDOSCOPY;  Service: Endoscopy;  Laterality: N/A;    SOCIAL HISTORY:   Social History  Substance Use Topics  . Smoking status: Current Every Day Smoker    Packs/day: 0.25    Years: 20.00    Types: Cigarettes  . Smokeless tobacco: Never Used  . Alcohol use 0.0 oz/week     Comment: occasionally    FAMILY HISTORY:   Family History  Problem Relation Age of Onset  . Renal Disease Mother   . Diabetes Mother   . Hypertension Mother   . Emphysema Father   . Breast cancer Neg Hx     DRUG ALLERGIES:  No Known Allergies  REVIEW OF SYSTEMS:   Review of Systems  Constitutional: Positive for malaise/fatigue. Negative for chills and fever.  HENT: Negative.  Negative for ear discharge, ear pain, hearing loss, nosebleeds and sore throat.   Eyes: Negative.  Negative for blurred vision and pain.  Respiratory: Positive for shortness of breath. Negative for cough, hemoptysis and wheezing.   Cardiovascular: Positive for orthopnea, leg swelling and PND. Negative for chest pain and palpitations.  Gastrointestinal: Positive for nausea and vomiting. Negative for abdominal pain, blood in stool and diarrhea.  Genitourinary: Negative.  Negative for dysuria.  Musculoskeletal: Negative.  Negative for back pain.  Skin: Negative.   Neurological: Positive for weakness. Negative for dizziness, tremors, speech change, focal weakness, seizures and headaches.  Endo/Heme/Allergies: Negative.  Does not bruise/bleed easily.  Psychiatric/Behavioral: Negative.  Negative for depression, hallucinations and suicidal ideas.    MEDICATIONS AT HOME:  Prior to Admission medications   Medication Sig Start Date End Date Taking? Authorizing Provider  albuterol (PROVENTIL HFA;VENTOLIN HFA) 108 (90 Base) MCG/ACT inhaler Inhale 2 puffs into the lungs every 6 (six) hours as needed for wheezing. 09/16/16   Historical Provider, MD  carvedilol (COREG) 3.125 MG tablet Take 1 tablet (3.125 mg total) by mouth 2 (two)  times daily with a meal. 03/17/17   Loletha Grayer, MD  citalopram (CELEXA) 10 MG tablet Take 10 mg by mouth at bedtime.    Historical Provider, MD  Ergocalciferol 2000 units TABS Take 2,000 Units by mouth daily.     Historical Provider, MD  furosemide (LASIX) 20 MG tablet Take 1 tablet (20 mg total) by mouth daily. 03/17/17   Loletha Grayer, MD  loratadine (CLARITIN) 10 MG tablet Take 10 mg by mouth daily.    Historical Provider, MD  losartan (COZAAR) 25 MG tablet Take 1 tablet (25 mg total) by mouth at bedtime. 03/17/17   Loletha Grayer, MD  mometasone-formoterol [facility]) 200-5 MCG/ACT AERO Inhale 2 puffs into the lungs 2 (two) times daily. 03/17/17   Loletha Grayer, MD  OXYGEN Inhale 2 L into the lungs continuous.     Historical Provider, MD  predniSONE (DELTASONE) 20 MG tablet 2 tablets daily for 4 days 03/17/17   Loletha Grayer, MD  rivaroxaban (XARELTO) 20 MG TABS tablet Take 1 tablet (20 mg total) by mouth daily. 03/17/17   Loletha Grayer, MD  Tiotropium Bromide Monohydrate (SPIRIVA RESPIMAT) 2.5 MCG/ACT AERS Inhale 1 puff into the lungs 2 (two) times daily. 05/13/16   Historical Provider, MD      VITAL SIGNS:  Blood pressure (!) 80/56, pulse 73, temperature 98 F (36.7 C), temperature source Oral, resp. rate 20, height 5' (1.524 m), weight 81.6 kg (180 lb), last menstrual period 04/14/2017, SpO2 97 %.  PHYSICAL EXAMINATION:   Physical Exam  Constitutional: She is oriented to person, place, and time. She appears distressed.  HENT:  Head: Normocephalic and atraumatic.  Neck: Normal range of motion. JVD present. Tracheal deviation present. Thyromegaly present.  Cardiovascular:  Murmur heard. irr, irr   Pulmonary/Chest: No stridor.  Decreased breath sounds right  Crackles left base  Abdominal: She exhibits distension. She exhibits no mass. There is tenderness. There is no rebound and no guarding.  Musculoskeletal: Normal range of motion. She exhibits edema.  Neurological: She is  alert and oriented to person, place, and time.  Skin: Skin is warm and dry.  Psychiatric: Affect and judgment normal.      LABORATORY PANEL:   CBC  Recent Labs Lab 04/14/17 1622  WBC 10.8  HGB 8.9*  HCT 25.8*  PLT 274   ------------------------------------------------------------------------------------------------------------------  Chemistries   Recent Labs Lab 04/14/17 1622  NA 132*  K 5.0  CL 106  CO2 17*  GLUCOSE 91  BUN 28*  CREATININE 1.29*  CALCIUM 7.5*  AST 26  ALT 8*  ALKPHOS 92  BILITOT 6.4*   ------------------------------------------------------------------------------------------------------------------  Cardiac Enzymes  Recent Labs Lab 04/14/17 1622  TROPONINI <0.03   ------------------------------------------------------------------------------------------------------------------  RADIOLOGY:  Dg Chest Portable 1 View  Result Date: 04/14/2017 CLINICAL DATA:  Shortness of breath.  Lower extremity swelling. EXAM: PORTABLE CHEST 1 VIEW COMPARISON:  03/12/2017. FINDINGS: Marked enlargement of cardiac silhouette. Vascular congestion is improved. Increasing RIGHT pleural effusion. No consolidation. IMPRESSION: Improved CHF, but increasing RIGHT pleural effusion. Marked enlargement cardiac silhouette is stable. Electronically Signed   By: Staci Righter M.D.   On: 04/14/2017 16:51  EKG:   Atrial Fluter  3:1 block No STEMI  IMPRESSION AND PLAN:   46 year-old female with Chronic diastolic heart failure, severe pulmonary hypertension with valvular disease and chronic respiratory failure on 2 L oxygen from COPD who presents with shortness of breath.  1.acute on chronic hypoxic respiratory failure Patient signs and symptoms are consistent with acute on chronic diastolic congestive heart failure. Given echocardiogram as stated above we'll order a VQ scan to rule out pulmonary emboli. Start IV Lasix drip due to hypotension. I have spoken with Dr  Humphrey Rolls about plan of care as well as intensivist at [place]. Baseline oxygen at home is 2 L now she is on 4 L.  2. Hypotension in the setting of CHF exacerbation with baseline blood pressures in the 90s Start dopamine with goal MAP>65. Watch for tachycardia Hold Coreg and losartan. 3. Right pleural effusion: Patient may require therapeutic thoracentesis  4. Ascites: Patient may require therapeutic paracentesis in a.m. Abdominal ultrasound ordered.  5. History of atrial fibrillation/atrial flutter: Patient is on Xarelto. Likely she may have increased heart rates with dopamine, if so she will need to be on amiodarone drip.  6. Acute kidney injury in the setting of CHF exacerbation Monitor BMP every 6 hours while on Lasix drip  6. History of COPD without exacerbation: Continue inhalers  8. History of rectal mass: Patient will have this followed up as outpatient  9. History of sickle cell anemia with acute on chronic anemia Follow CBC  10. Severe pulmonary hypertension and mitral and tricuspid valvular disease  11. Patient is critically ill. Patient has a very poor prognosis. Patient needs palliative care consultation. 12 Tobacco dependence: Patient is encouraged to quit smoking. Counseling was provided for 4 minutes.  Case discussed with family and cardiology and intensivist  All the records are reviewed and case discussed with ED provider as above Management plans discussed with the patient and she is in agreement  CODE STATUS: Full  Critical care TOTAL TIME TAKING CARE OF THIS PATIENT: 65 minutes.    Keana Dueitt M.D on 04/14/2017 at 6:15 PM  Between 7am to 6pm - Pager - [phone]  After 6pm go to [email or web address] - password [email or web address] [facility]  Office  [phone]  CC: Primary care physician; Danelle Berry, NP

## 2017-04-14 NOTE — ED Notes (Signed)
[employer]

## 2017-04-14 NOTE — ED Notes (Signed)
VO from Dr Cinda Quest for Zofran 4mg 

## 2017-04-14 NOTE — ED Provider Notes (Signed)
[facility] Emergency Department Provider Note   ____________________________________________   First MD Initiated Contact with Patient 04/14/17 1611     (approximate)  I have reviewed the triage vital signs and the nursing notes.   HISTORY  Chief Complaint Shortness of Breath    HPI Shannon Schneider is a 46 y.o. female who has a history of CHF COPD and sickle cell disease. She reports she's been having increasing swelling in her abdomen and her legs for several weeks. She went to see her doctor recently and had her Lasix and creased but she is continuing to worsen and now is getting short of breath. She called the ambulance and came in. She is worse with exertion. She denies having any chest pain or other symptoms week or 2 ago. At present she is having a little bit of pain in the left side of her abdomen.   Past Medical History:  Diagnosis Date  . Congestive heart failure (CHF) ([place]) 06/27/15  . COPD (chronic obstructive pulmonary disease) ([place]) 06/27/15  . Hyperbilirubinemia   . 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 Rectal mass   . Sickle cell anemia [facility])     Patient Active Problem List   Diagnosis Date Noted  . Elevated troponin   . CHF (congestive heart failure) ([place]) 03/12/2017  . Shortness of breath   . Hyponatremia 12/30/2016  . Mass of perirectal soft tissue 06/22/2015    Past Surgical History:  Procedure Laterality Date  . CESAREAN SECTION  1989  . CHOLECYSTECTOMY  1996  . EUS N/A 07/13/2015   Procedure: LOWER ENDOSCOPIC ULTRASOUND (EUS);  Surgeon: Cora Daniels, MD;  Location: [facility] ENDOSCOPY;  Service: Endoscopy;  Laterality: N/A;    Prior to Admission medications   Medication Sig Start Date End Date Taking? Authorizing Provider  albuterol (PROVENTIL HFA;VENTOLIN HFA) 108 (90 Base) MCG/ACT inhaler Inhale 2 puffs into the lungs every 6 (six) hours as needed for wheezing. 09/16/16   Historical Provider, MD  carvedilol (COREG) 3.125 MG  tablet Take 1 tablet (3.125 mg total) by mouth 2 (two) times daily with a meal. 03/17/17   Loletha Grayer, MD  citalopram (CELEXA) 10 MG tablet Take 10 mg by mouth at bedtime.    Historical Provider, MD  Ergocalciferol 2000 units TABS Take 2,000 Units by mouth daily.     Historical Provider, MD  furosemide (LASIX) 20 MG tablet Take 1 tablet (20 mg total) by mouth daily. 03/17/17   Loletha Grayer, MD  loratadine (CLARITIN) 10 MG tablet Take 10 mg by mouth daily.    Historical Provider, MD  losartan (COZAAR) 25 MG tablet Take 1 tablet (25 mg total) by mouth at bedtime. 03/17/17   Loletha Grayer, MD  mometasone-formoterol [facility]) 200-5 MCG/ACT AERO Inhale 2 puffs into the lungs 2 (two) times daily. 03/17/17   Loletha Grayer, MD  OXYGEN Inhale 2 L into the lungs continuous.     Historical Provider, MD  predniSONE (DELTASONE) 20 MG tablet 2 tablets daily for 4 days 03/17/17   Loletha Grayer, MD  rivaroxaban (XARELTO) 20 MG TABS tablet Take 1 tablet (20 mg total) by mouth daily. 03/17/17   Loletha Grayer, MD  Tiotropium Bromide Monohydrate (SPIRIVA RESPIMAT) 2.5 MCG/ACT AERS Inhale 1 puff into the lungs 2 (two) times daily. 05/13/16   Historical Provider, MD    Allergies Patient has no known allergies.  Family History  Problem Relation Age of Onset  . Renal Disease Mother   . Diabetes Mother   .  Hypertension Mother   . Emphysema Father   . Breast cancer Neg Hx     Social History Social History  Substance Use Topics  . Smoking status: Current Every Day Smoker    Packs/day: 0.25    Years: 20.00    Types: Cigarettes  . Smokeless tobacco: Never Used  . Alcohol use 0.0 oz/week     Comment: occasionally    Review of Systems Constitutional: No fever/chills Eyes: No visual changes. ENT: No sore throat. Cardiovascular: Denies chest pain. Respiratory:shortness of breath. Gastrointestinal: abdominal pain. nausea,  vomitingAll this begins in the ER.  No diarrhea.  No  constipation. Genitourinary: Negative for dysuria. Musculoskeletal: Negative for back pain.  10-point ROS otherwise negative.  ____________________________________________   PHYSICAL EXAM:  VITAL SIGNS: ED Triage Vitals  Enc Vitals Group     BP --      Pulse --      Resp --      Temp --      Temp src --      SpO2 --      Weight 04/14/17 1611 180 lb (81.6 kg)     Height 04/14/17 1611 5' (1.524 m)     Head Circumference --      Peak Flow --      Pain Score 04/14/17 1610 5     Pain Loc --      Pain Edu? --      Excl. in [place]? --     Constitutional: Alert and oriented. Looks very tired Eyes: Conjunctivae are normal. PERRL. EOMI. Head: Atraumatic. Nose: No congestion/rhinnorhea. Mouth/Throat: Mucous membranes are moist.  Oropharynx non-erythematous. Neck: No stridor.   Cardiovascular: Normal rate, regular rhythm. Grossly normal heart sounds.  Good peripheral circulation. Patient has JVD to the ankles bilaterally limits tenderness. Respiratory: Normal respiratory effort.  No retractions. Lungs CTAB. Gastrointestinal: Soft and nontender. Distention distention. No abdominal bruits. No CVA tenderness. Musculoskeletal: No lower extremity tenderness marked edema  No joint effusions. Skin:  Skin is warm, dry and intact. No rash noted.   ____________________________________________   LABS (all labs ordered are listed, but only abnormal results are displayed)  Labs Reviewed  COMPREHENSIVE METABOLIC PANEL - Abnormal; Notable for the following:       Result Value   Sodium 132 (*)    CO2 17 (*)    BUN 28 (*)    Creatinine, Ser 1.29 (*)    Calcium 7.5 (*)    Total Protein 5.7 (*)    Albumin 3.1 (*)    ALT 8 (*)    Total Bilirubin 6.4 (*)    GFR calc non Af Amer 49 (*)    GFR calc Af Amer 57 (*)    All other components within normal limits  BRAIN NATRIURETIC PEPTIDE - Abnormal; Notable for the following:    B Natriuretic Peptide 1,145.0 (*)    All other components within  normal limits  CBC WITH DIFFERENTIAL/PLATELET - Abnormal; Notable for the following:    RBC 2.22 (*)    Hemoglobin 8.9 (*)    HCT 25.8 (*)    MCV 116.4 (*)    MCH 40.2 (*)    RDW 22.4 (*)    nRBC 41 (*)    Neutro Abs 8.8 (*)    All other components within normal limits  TROPONIN I  URINALYSIS, COMPLETE (UACMP) WITH MICROSCOPIC  PREGNANCY, URINE  BASIC METABOLIC PANEL  BASIC METABOLIC PANEL  BASIC METABOLIC PANEL   ____________________________________________  EKG  EKG read and interpreted by me shows atrial flutter rate of 75 block and nonspecific ST-T wave changes diffusely ____________________________________________  RADIOLOGY  Study Result   CLINICAL DATA:  Shortness of breath.  Lower extremity swelling.  EXAM: PORTABLE CHEST 1 VIEW  COMPARISON:  03/12/2017.  FINDINGS: Marked enlargement of cardiac silhouette. Vascular congestion is improved. Increasing RIGHT pleural effusion. No consolidation.  IMPRESSION: Improved CHF, but increasing RIGHT pleural effusion. Marked enlargement cardiac silhouette is stable.   Electronically Signed   By: Staci Righter M.D.   On: 04/14/2017 16:51    ____________________________________________   PROCEDURES  Procedure(s) performed: Bedside cardiac echo done by myself seems to show a pericardial effusion but no right-sided collapse. I called for cardiac echo and they came in and confirmed by impression. We were waiting for the official read by Dr. Governor Rooks time. Should add that I spoke with Dr. Chancy Milroy and he agreed that we should admit the child patient put her on low-dose dopamine and titrated to get a blood pressure 90 systolic range. Procedures  Critical Care performed: Critical care time including speaking with the patient and her family and speaking with Dr. Yancey Flemings and Dr. Lonzo Candy the echo tech and doing a bedside echo etc. and calling for the PICC line 1 hour ____________________________________________   INITIAL  IMPRESSION / [place] / ED COURSE  Pertinent labs & imaging results that were available during my care of the patient were reviewed by me and considered in my medical decision making (see chart for details).        ____________________________________________   FINAL CLINICAL IMPRESSION(S) / ED DIAGNOSES  Final diagnoses:  Dyspnea  Acute systolic congestive heart failure (HCC)  AKI (acute kidney injury) (HCC)  Anemia, unspecified type      NEW MEDICATIONS STARTED DURING THIS VISIT:  New Prescriptions   No medications on file     Note:  This document was prepared using Dragon voice recognition software and may include unintentional dictation errors.    Nena Polio, MD 04/14/17 [phone]

## 2017-04-14 NOTE — ED Notes (Signed)
Per Dr Cinda Quest to have dopamine at bedside but not to hang until further instruction is given

## 2017-04-14 NOTE — ED Notes (Signed)
Pt arrived via ems for c/o shortness of breath - pt has a history of CHF, COPD, sickle cell and afib - pt went to [facility] Friday and Lasix was increased to 40mg  per day - pt continues with shortness of breath and swelling in abe and bilat lower ext - abd is tight and bilat legs +3 pitting edema - lungs are clear in all lobes at this time - whites of eyes appear yellow - Dr Cinda Quest at bedside evaluating pt

## 2017-04-14 NOTE — ED Notes (Signed)
Per Dr Cinda Quest start Dopamine drip

## 2017-04-15 ENCOUNTER — Inpatient Hospital Stay: Payer: BLUE CROSS/BLUE SHIELD

## 2017-04-15 DIAGNOSIS — R748 Abnormal levels of other serum enzymes: Secondary | ICD-10-CM

## 2017-04-15 DIAGNOSIS — J449 Chronic obstructive pulmonary disease, unspecified: Secondary | ICD-10-CM

## 2017-04-15 DIAGNOSIS — D571 Sickle-cell disease without crisis: Secondary | ICD-10-CM

## 2017-04-15 DIAGNOSIS — J9621 Acute and chronic respiratory failure with hypoxia: Secondary | ICD-10-CM

## 2017-04-15 DIAGNOSIS — I2781 Cor pulmonale (chronic): Secondary | ICD-10-CM

## 2017-04-15 DIAGNOSIS — R188 Other ascites: Secondary | ICD-10-CM

## 2017-04-15 DIAGNOSIS — I272 Pulmonary hypertension, unspecified: Secondary | ICD-10-CM

## 2017-04-15 LAB — ECHOCARDIOGRAM COMPLETE
Area-P 1/2: 4.23 cm2
E/e' ratio: 11.13
EWDT: 176 ms
FS: 44 % (ref 28–44)
HEIGHTINCHES: 60 in
IVS/LV PW RATIO, ED: 0.74
LA ID, A-P, ES: 48 mm
LA diam end sys: 48 mm
LA vol A4C: 45.5 ml
LA vol index: 28.5 mL/m2
LADIAMINDEX: 2.53 cm/m2
LAVOL: 54.2 mL
LV E/e' medial: 11.13
LV e' LATERAL: 11.5 cm/s
LVEEAVG: 11.13
MV Dec: 176
MV pk A vel: 55.8 m/s
MVPG: 7 mmHg
MVPKEVEL: 128 m/s
P 1/2 time: 52 ms
PW: 8.36 mm — AB (ref 0.6–1.1)
RV LATERAL S' VELOCITY: 10.1 cm/s
RV TAPSE: 17.5 mm
TDI e' lateral: 11.5
WEIGHTICAEL: 2880 [oz_av]

## 2017-04-15 LAB — BASIC METABOLIC PANEL
ANION GAP: 11 (ref 5–15)
Anion gap: 10 (ref 5–15)
Anion gap: 10 (ref 5–15)
BUN: 36 mg/dL — ABNORMAL HIGH (ref 6–20)
BUN: 38 mg/dL — AB (ref 6–20)
BUN: 37 mg/dL — AB (ref 6–20)
CO2: 22 mmol/L (ref 22–32)
CO2: 23 mmol/L (ref 22–32)
CO2: 23 mmol/L (ref 22–32)
CREATININE: 1.76 mg/dL — AB (ref 0.44–1.00)
CREATININE: 1.85 mg/dL — AB (ref 0.44–1.00)
Calcium: 8.9 mg/dL (ref 8.9–10.3)
Calcium: 8.7 mg/dL — ABNORMAL LOW (ref 8.9–10.3)
Calcium: 9.1 mg/dL (ref 8.9–10.3)
Chloride: 100 mmol/L — ABNORMAL LOW (ref 101–111)
Chloride: 100 mmol/L — ABNORMAL LOW (ref 101–111)
Chloride: 102 mmol/L (ref 101–111)
Creatinine, Ser: 1.66 mg/dL — ABNORMAL HIGH (ref 0.44–1.00)
GFR calc Af Amer: 39 mL/min — ABNORMAL LOW (ref >60)
GFR calc Af Amer: 37 mL/min — ABNORMAL LOW (ref >60)
GFR, EST AFRICAN AMERICAN: 42 mL/min — AB (ref >60)
GFR, EST NON AFRICAN AMERICAN: 36 mL/min — AB (ref >60)
GFR, EST NON AFRICAN AMERICAN: 34 mL/min — AB (ref >60)
GFR, EST NON AFRICAN AMERICAN: 32 mL/min — AB (ref >60)
GLUCOSE: 107 mg/dL — AB (ref 65–99)
GLUCOSE: 133 mg/dL — AB (ref 65–99)
Glucose, Bld: 124 mg/dL — ABNORMAL HIGH (ref 65–99)
POTASSIUM: 4 mmol/L (ref 3.5–5.1)
POTASSIUM: 4 mmol/L (ref 3.5–5.1)
POTASSIUM: 4 mmol/L (ref 3.5–5.1)
SODIUM: 133 mmol/L — AB (ref 135–145)
Sodium: 133 mmol/L — ABNORMAL LOW (ref 135–145)
Sodium: 135 mmol/L (ref 135–145)

## 2017-04-15 LAB — BODY FLUID CELL COUNT WITH DIFFERENTIAL
Eos, Fluid: 0 %
Lymphs, Fluid: 40 %
Monocyte-Macrophage-Serous Fluid: 48 %
Neutrophil Count, Fluid: 12 %
Total Nucleated Cell Count, Fluid: 400 cu mm

## 2017-04-15 LAB — COMPREHENSIVE METABOLIC PANEL
ALBUMIN: 3.4 g/dL — AB (ref 3.5–5.0)
ALK PHOS: 102 U/L (ref 38–126)
ALT: 10 U/L — ABNORMAL LOW (ref 14–54)
AST: 16 U/L (ref 15–41)
Anion gap: 10 (ref 5–15)
BILIRUBIN TOTAL: 7.6 mg/dL — AB (ref 0.3–1.2)
BUN: 35 mg/dL — AB (ref 6–20)
CO2: 22 mmol/L (ref 22–32)
Calcium: 9 mg/dL (ref 8.9–10.3)
Chloride: 100 mmol/L — ABNORMAL LOW (ref 101–111)
Creatinine, Ser: 1.61 mg/dL — ABNORMAL HIGH (ref 0.44–1.00)
GFR calc Af Amer: 43 mL/min — ABNORMAL LOW (ref >60)
GFR calc non Af Amer: 37 mL/min — ABNORMAL LOW (ref >60)
GLUCOSE: 120 mg/dL — AB (ref 65–99)
POTASSIUM: 3.8 mmol/L (ref 3.5–5.1)
Sodium: 132 mmol/L — ABNORMAL LOW (ref 135–145)
TOTAL PROTEIN: 6.2 g/dL — AB (ref 6.5–8.1)

## 2017-04-15 LAB — CBC
HCT: 27.2 % — ABNORMAL LOW (ref 35.0–47.0)
Hemoglobin: 9.4 g/dL — ABNORMAL LOW (ref 12.0–16.0)
MCH: 39.3 pg — AB (ref 26.0–34.0)
MCHC: 34.6 g/dL (ref 32.0–36.0)
MCV: 113.7 fL — ABNORMAL HIGH (ref 80.0–100.0)
Platelets: 283 10*3/uL (ref 150–440)
RBC: 2.39 MIL/uL — ABNORMAL LOW (ref 3.80–5.20)
RDW: 22.5 % — AB (ref 11.5–14.5)
WBC: 11.7 10*3/uL — ABNORMAL HIGH (ref 3.6–11.0)

## 2017-04-15 LAB — PROTEIN, PLEURAL OR PERITONEAL FLUID: Total protein, fluid: <3 g/dL

## 2017-04-15 LAB — PROCALCITONIN
PROCALCITONIN: 0.33 ng/mL
PROCALCITONIN: 0.42 ng/mL

## 2017-04-15 LAB — TROPONIN I
TROPONIN I: 0.19 ng/mL — AB (ref <0.03)
TROPONIN I: 0.14 ng/mL — AB (ref <0.03)

## 2017-04-15 LAB — BILIRUBIN, DIRECT: Bilirubin, Direct: 3.5 mg/dL — ABNORMAL HIGH (ref 0.1–0.5)

## 2017-04-15 LAB — ALBUMIN, PLEURAL OR PERITONEAL FLUID: ALBUMIN FL: 1.5 g/dL

## 2017-04-15 LAB — AMYLASE, PLEURAL OR PERITONEAL FLUID: Amylase, Fluid: 11 U/L

## 2017-04-15 LAB — PHOSPHORUS: Phosphorus: 6.6 mg/dL — ABNORMAL HIGH (ref 2.5–4.6)

## 2017-04-15 LAB — GLUCOSE, PLEURAL OR PERITONEAL FLUID: GLUCOSE FL: 120 mg/dL

## 2017-04-15 LAB — MAGNESIUM: Magnesium: 2.1 mg/dL (ref 1.7–2.4)

## 2017-04-15 LAB — GLUCOSE, CAPILLARY: GLUCOSE-CAPILLARY: 119 mg/dL — AB (ref 65–99)

## 2017-04-15 MED ORDER — OXYCODONE HCL 5 MG PO TABS
5.0000 mg | ORAL_TABLET | ORAL | Status: DC | PRN
  Administered 2017-04-15 – 2017-04-16 (×2): 5 mg via ORAL
  Filled 2017-04-15 (×3): qty 1

## 2017-04-15 MED ORDER — SILDENAFIL CITRATE 20 MG PO TABS
20.0000 mg | ORAL_TABLET | Freq: Three times a day (TID) | ORAL | Status: DC
  Administered 2017-04-15 – 2017-04-23 (×22): 20 mg via ORAL
  Filled 2017-04-15 (×28): qty 1

## 2017-04-15 MED ORDER — SODIUM CHLORIDE 0.9% FLUSH
10.0000 mL | Freq: Two times a day (BID) | INTRAVENOUS | Status: DC
  Administered 2017-04-15: 30 mL
  Administered 2017-04-15 – 2017-04-20 (×10): 10 mL
  Administered 2017-04-20: 13 mL
  Administered 2017-04-21: 20 mL
  Administered 2017-04-22 – 2017-04-23 (×4): 10 mL

## 2017-04-15 MED ORDER — ONDANSETRON HCL 4 MG PO TABS: 4.0000 mg | ORAL_TABLET | Freq: Four times a day (QID) | ORAL | Status: DC | PRN

## 2017-04-15 MED ORDER — SODIUM CHLORIDE 0.9% FLUSH: 10.0000 mL | INTRAVENOUS | Status: DC | PRN

## 2017-04-15 MED ORDER — ONDANSETRON HCL 4 MG/2ML IJ SOLN: 4.0000 mg | Freq: Four times a day (QID) | INTRAMUSCULAR | Status: DC | PRN

## 2017-04-15 MED ORDER — MORPHINE SULFATE (PF) 4 MG/ML IV SOLN
1.0000 mg | Freq: Three times a day (TID) | INTRAVENOUS | Status: DC | PRN
  Administered 2017-04-15 – 2017-04-16 (×3): 1 mg via INTRAVENOUS
  Filled 2017-04-15 (×3): qty 1

## 2017-04-15 MED ORDER — APIXABAN 5 MG PO TABS
5.0000 mg | ORAL_TABLET | Freq: Two times a day (BID) | ORAL | Status: DC
  Administered 2017-04-15 – 2017-04-18 (×7): 5 mg via ORAL
  Filled 2017-04-15 (×7): qty 1

## 2017-04-15 NOTE — Progress Notes (Signed)
MEDICATION RELATED CONSULT NOTE - INITIAL   Pharmacy Consult for electrolyte monitoring and replacement if needed  No Known Allergies  Patient Measurements: Height: 5' (152.4 cm) Weight: 177 lb 12.8 oz (80.6 kg) IBW/kg (Calculated) : 45.5  Vital Signs: Temp: 97 F (36.1 C) (04/17 0800) Temp Source: Axillary (04/17 0800) BP: 79/65 (04/17 1030) Pulse Rate: 76 (04/17 1030) Intake/Output from previous day: 04/16 0701 - 04/17 0700 In: 56.8 [I.V.:56.8] Out: 950 [Urine:950] Intake/Output from this shift: Total I/O In: 35.9 [I.V.:35.9] Out: 350 [Urine:350]  Labs:  Recent Labs  04/14/17 1622 04/15/17 0145 04/15/17 0550 04/15/17 1303  WBC 10.8  --  11.7*  --   HGB 8.9*  --  9.4*  --   HCT 25.8*  --  27.2*  --   PLT 274  --  283  --   CREATININE 1.29* 1.66* 1.61* 1.76*  MG  --   --  2.1  --   PHOS  --   --  6.6*  --   ALBUMIN 3.1*  --  3.4*  --   PROT 5.7*  --  6.2*  --   AST 26  --  16  --   ALT 8*  --  10*  --   ALKPHOS 92  --  102  --   BILITOT 6.4*  --  7.6*  --   BILIDIR  --   --  3.5*  --    .  Estimated Creatinine Clearance: 37.6 mL/min (A) (by C-G formula based on SCr of 1.76 mg/dL (H)).   Microbiology: Recent Results (from the past 720 hour(s))  MRSA PCR Screening     Status: None   Collection Time: 04/14/17  9:28 PM  Result Value Ref Range Status   MRSA by PCR NEGATIVE NEGATIVE Final    Comment:        The GeneXpert MRSA Assay (FDA approved for NASAL specimens only), is one component of a comprehensive MRSA colonization surveillance program. It is not intended to diagnose MRSA infection nor to guide or monitor treatment for MRSA infections.   CULTURE, BLOOD (ROUTINE X 2) w Reflex to ID Panel     Status: None (Preliminary result)   Collection Time: 04/14/17 10:37 PM  Result Value Ref Range Status   Specimen Description BLOOD RIGHT WRIST  Final   Special Requests   Final    BOTTLES DRAWN AEROBIC AND ANAEROBIC Blood Culture adequate volume   Culture NO GROWTH < 12 HOURS  Final   Report Status PENDING  Incomplete  CULTURE, BLOOD (ROUTINE X 2) w Reflex to ID Panel     Status: None (Preliminary result)   Collection Time: 04/14/17 10:47 PM  Result Value Ref Range Status   Specimen Description BLOOD LEFT WRIST  Final   Special Requests   Final    BOTTLES DRAWN AEROBIC AND ANAEROBIC Blood Culture adequate volume   Culture NO GROWTH < 12 HOURS  Final   Report Status PENDING  Incomplete    Medical History: Past Medical History:  Diagnosis Date  . Congestive heart failure (CHF) ([place]) 06/27/15  . COPD (chronic obstructive pulmonary disease) ([place]) 06/27/15  . Hyperbilirubinemia   . 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 Rectal mass   . Sickle cell anemia (HCC)     Assessment: 46 y/o F admitted with acute on chronic respiratory failure.   Plan:  Electrolytes are WNL. Will continue to follow BMETs and replace electrolytes PRN.   Napoleon Form , PharmD 04/15/2017,1:59 PM

## 2017-04-15 NOTE — Consult Note (Signed)
Shannon Lame, MD [facility]  [address], [place],  [address] Phone: [phone] Fax : [phone]  Consultation  Referring Provider:     Dr. Benjie Karvonen Primary Care Physician:  Danelle Berry, NP Primary Gastroenterologist:  Dr. Gustavo Lah         Reason for Consultation:     Abnormal LFT's and ascties  Date of Admission:  04/14/2017 Date of Consultation:  04/15/2017         HPI:   Shannon Schneider is a 46 y.o. female who was admitted with congestive heart failure and was noted to have increased LFTs. The patient has had this for some time and has had an extensive workup in the past. The patient's most recent workup included notes back in December and in January of this year. There is also mention of consultation with [facility] hepatology about this patient. The increased bilirubin was thought to be a combination of multiple factors including the patient sickle cell disease. The patient's direct bilirubin only accounted for half of the total bilirubin. The patient is now admitted with COPD exacerbation. The patient also had a fibrosis scan that showed possible fibrosis score of 3 and 4 consistent with possible cirrhosis.  Past Medical History:  Diagnosis Date  . Congestive heart failure (CHF) ([place]) 06/27/15  . COPD (chronic obstructive pulmonary disease) ([place]) 06/27/15  . Hyperbilirubinemia   . 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 Rectal mass   . Sickle cell anemia (HCC)     Past Surgical History:  Procedure Laterality Date  . CESAREAN SECTION  1989  . CHOLECYSTECTOMY  1996  . EUS N/A 07/13/2015   Procedure: LOWER ENDOSCOPIC ULTRASOUND (EUS);  Surgeon: Cora Daniels, MD;  Location: [facility] ENDOSCOPY;  Service: Endoscopy;  Laterality: N/A;    Prior to Admission medications   Medication Sig Start Date End Date Taking? Authorizing Provider  albuterol (PROVENTIL HFA;VENTOLIN HFA) 108 (90 Base) MCG/ACT inhaler Inhale 2 puffs into the lungs every 6 (six) hours as needed for wheezing.  09/16/16  Yes Historical Provider, MD  citalopram (CELEXA) 10 MG tablet Take 10 mg by mouth at bedtime.   Yes Historical Provider, MD  Ergocalciferol 2000 units TABS Take 2,000 Units by mouth daily.    Yes Historical Provider, MD  furosemide (LASIX) 20 MG tablet Take 1 tablet (20 mg total) by mouth daily. 03/17/17  Yes Richard Leslye Peer, MD  loratadine (CLARITIN) 10 MG tablet Take 10 mg by mouth daily.   Yes Historical Provider, MD  metolazone (ZAROXOLYN) 5 MG tablet Take 5 mg by mouth 2 (two) times daily.   Yes Historical Provider, MD  metoprolol succinate (TOPROL-XL) 100 MG 24 hr tablet Take 100 mg by mouth daily. Take with or immediately following a meal.   Yes Historical Provider, MD  mometasone-formoterol (DULERA) 200-5 MCG/ACT AERO Inhale 2 puffs into the lungs 2 (two) times daily. 03/17/17  Yes Loletha Grayer, MD  rivaroxaban (XARELTO) 20 MG TABS tablet Take 1 tablet (20 mg total) by mouth daily. 03/17/17  Yes Loletha Grayer, MD  spironolactone (ALDACTONE) 25 MG tablet Take 25 mg by mouth daily.   Yes Historical Provider, MD  Tiotropium Bromide Monohydrate (SPIRIVA RESPIMAT) 2.5 MCG/ACT AERS Inhale 1 puff into the lungs 2 (two) times daily. 05/13/16  Yes Historical Provider, MD  OXYGEN Inhale 2 L into the lungs continuous.     Historical Provider, MD    Family History  Problem Relation Age of Onset  . Renal Disease Mother   .  Diabetes Mother   . Hypertension Mother   . Emphysema Father   . Breast cancer Neg Hx      Social History  Substance Use Topics  . Smoking status: Current Every Day Smoker    Packs/day: 0.25    Years: 20.00    Types: Cigarettes  . Smokeless tobacco: Never Used  . Alcohol use 0.0 oz/week     Comment: occasionally    Allergies as of 04/14/2017  . (No Known Allergies)    Review of Systems:    All systems reviewed and negative except where noted in HPI.   Physical Exam:  Vital signs in last 24 hours: Temp:  [97 F (36.1 C)-98 F (36.7 C)] 97 F (36.1  C) (04/17 0800) Pulse Rate:  [70-115] 76 (04/17 1030) Resp:  [15-25] 18 (04/17 1030) BP: (72-100)/(47-69) 79/65 (04/17 1030) SpO2:  [88 %-100 %] 96 % (04/17 1030) FiO2 (%):  [40 %-50 %] 50 % (04/17 1000) Weight:  [177 lb 12.8 oz (80.6 kg)-180 lb (81.6 kg)] 177 lb 12.8 oz (80.6 kg) (04/17 0500) Last BM Date: 04/14/17 General:   Pleasant, cooperative in NAD Head:  Normocephalic and atraumatic. Eyes:   No icterus.   Conjunctiva pink. PERRLA. Ears:  Normal auditory acuity. Neck:  Supple; no masses or thyroidomegaly Lungs: Patient is on BiPAP with diffuse crackles.  Heart:  Regular rate and rhythm;  Without murmur, clicks, rubs or gallops Abdomen:  Soft, nondistended, nontender. Normal bowel sounds. No appreciable masses or hepatomegaly.  No rebound or guarding.  Rectal:  Not performed. Msk:  Symmetrical without gross deformities.   Extremities: Significant pitting edema bilaterally with out any cyanosis or clubbing. Neurologic:  Alert and oriented x3;  grossly normal neurologically. Skin:  Intact without significant lesions or rashes. Cervical Nodes:  No significant cervical adenopathy. Psych:  Alert and cooperative. Normal affect.  LAB RESULTS:  Recent Labs  04/14/17 1622 04/15/17 0550  WBC 10.8 11.7*  HGB 8.9* 9.4*  HCT 25.8* 27.2*  PLT 274 283   BMET  Recent Labs  04/14/17 1622 04/15/17 0145 04/15/17 0550  NA 132* 133* 132*  K 5.0 4.0 3.8  CL 106 100* 100*  CO2 17* 22 22  GLUCOSE 91 124* 120*  BUN 28* 36* 35*  CREATININE 1.29* 1.66* 1.61*  CALCIUM 7.5* 8.9 9.0   LFT  Recent Labs  04/15/17 0550  PROT 6.2*  ALBUMIN 3.4*  AST 16  ALT 10*  ALKPHOS 102  BILITOT 7.6*  BILIDIR 3.5*   PT/INR No results for input(s): LABPROT, INR in the last 72 hours.  STUDIES: Dg Chest 1 View  Result Date: 04/14/2017 CLINICAL DATA:  PICC line placement. EXAM: CHEST 1 VIEW COMPARISON:  Chest radiograph April 14, 2017 at 1616 hours FINDINGS: RIGHT PICC distal tip projects  an proximal to mid superior vena cava. Stable cardiomegaly. Mediastinal silhouette is nonsuspicious. Small, decreased RIGHT pleural effusion with underlying airspace opacity. No pneumothorax. Soft tissue planes and included osseous structures are nonsuspicious. IMPRESSION: RIGHT PICC distal tip projects in proximal to mid superior vena cava, no pneumothorax. Stable cardiomegaly, superimposed pericardial effusion is possible. Small residual RIGHT pleural effusion with underlying airspace opacity. Electronically Signed   By: Elon Alas M.D.   On: 04/14/2017 20:54   US Venous Img Lower Bilateral  Result Date: 04/14/2017 CLINICAL DATA:  Bilateral lower extremity edema for 1 month. EXAM: BILATERAL LOWER EXTREMITY VENOUS DOPPLER ULTRASOUND TECHNIQUE: Gray-scale sonography with graded compression, as well as color Doppler and duplex ultrasound  were performed to evaluate the lower extremity deep venous systems from the level of the common femoral vein and including the common femoral, femoral, profunda femoral, popliteal and calf veins including the posterior tibial, peroneal and gastrocnemius veins when visible. The superficial great saphenous vein was also interrogated. Spectral Doppler was utilized to evaluate flow at rest and with distal augmentation maneuvers in the common femoral, femoral and popliteal veins. COMPARISON:  None. FINDINGS: RIGHT LOWER EXTREMITY Common Femoral Vein: No evidence of thrombus. Normal compressibility, respiratory phasicity and response to augmentation. Saphenofemoral Junction: No evidence of thrombus. Normal compressibility and flow on color Doppler imaging. Profunda Femoral Vein: No evidence of thrombus. Normal compressibility and flow on color Doppler imaging. Femoral Vein: No evidence of thrombus. Normal compressibility, respiratory phasicity and response to augmentation. Popliteal Vein: No evidence of thrombus. Normal compressibility, respiratory phasicity and response to  augmentation. Calf Veins: No evidence of thrombus. Normal compressibility and flow on color Doppler imaging. Superficial Great Saphenous Vein: No evidence of thrombus. Normal compressibility and flow on color Doppler imaging. Venous Reflux:  None. Other Findings: Ill-defined edema within the subcutaneous soft tissues, most prominent at the level of the calf LEFT LOWER EXTREMITY Common Femoral Vein: No evidence of thrombus. Normal compressibility, respiratory phasicity and response to augmentation. Saphenofemoral Junction: No evidence of thrombus. Normal compressibility and flow on color Doppler imaging. Profunda Femoral Vein: No evidence of thrombus. Normal compressibility and flow on color Doppler imaging. Femoral Vein: No evidence of thrombus. Normal compressibility, respiratory phasicity and response to augmentation. Popliteal Vein: No evidence of thrombus. Normal compressibility, respiratory phasicity and response to augmentation. Calf Veins: No evidence of thrombus. Normal compressibility and flow on color Doppler imaging. Superficial Great Saphenous Vein: No evidence of thrombus. Normal compressibility and flow on color Doppler imaging. Venous Reflux:  None. Other Findings: Ill-defined edema within the subcutaneous soft tissues, most prominent at the level of the calf. IMPRESSION: 1. No evidence of DVT, bilateral lower extremities. 2. Ill-defined edema within the subcutaneous soft tissues of both lower extremities, most prominent at the level of the calves. Electronically Signed   By: Franki Cabot M.D.   On: 04/14/2017 19:58   Dg Chest Port 1 View  Result Date: 04/15/2017 CLINICAL DATA:  Acute respiratory failure. EXAM: PORTABLE CHEST 1 VIEW COMPARISON:  04/14/2017 FINDINGS: Shallow inspiration. Diffuse cardiac enlargement. Pulmonary vascularity is normal. Small right pleural effusion. Atelectasis or consolidation in the right lung base. Left lung appears clear and expanded. Right PICC line with tip over  the low SVC region. No pneumothorax. IMPRESSION: Cardiac enlargement. Small right pleural effusion with basilar atelectasis or consolidation. Electronically Signed   By: Lucienne Capers M.D.   On: 04/15/2017 05:36   Dg Chest Portable 1 View  Result Date: 04/14/2017 CLINICAL DATA:  Shortness of breath.  Lower extremity swelling. EXAM: PORTABLE CHEST 1 VIEW COMPARISON:  03/12/2017. FINDINGS: Marked enlargement of cardiac silhouette. Vascular congestion is improved. Increasing RIGHT pleural effusion. No consolidation. IMPRESSION: Improved CHF, but increasing RIGHT pleural effusion. Marked enlargement cardiac silhouette is stable. Electronically Signed   By: Staci Righter M.D.   On: 04/14/2017 16:51      Impression / Plan:   Shannon Schneider is a 46 y.o. y/o female with an extensive workup by Dr. Gustavo Lah and his nurse practitioner within the last 3 months. Please review those notes including the mentioning of discussing the case with [facility] hepatology. No further GI workup is needed at this time since the patient recently had an entire  GI workup for this and is being followed by Dr. Gustavo Lah. The patient's bilirubin in fact is lower than it has been in the past. Her liver disease is unlikely the cause of her acute decompensation. The patient should follow-up with Dr. Gustavo Lah as an outpatient.  Thank you for involving me in the care of this patient.      LOS: 1 day   Shannon Lame, MD  04/15/2017, 11:08 AM   Note: This dictation was prepared with Dragon dictation along with smaller phrase technology. Any transcriptional errors that result from this process are unintentional.

## 2017-04-15 NOTE — Progress Notes (Signed)
MEDICATION RELATED CONSULT NOTE - INITIAL   Pharmacy Consult for electrolyte monitoring and replacement if needed  No Known Allergies  Patient Measurements: Height: 5' (152.4 cm) Weight: 177 lb 12.8 oz (80.6 kg) IBW/kg (Calculated) : 45.5  Vital Signs: Temp: 97 F (36.1 C) (04/17 0800) Temp Source: Axillary (04/17 0800) BP: 78/67 (04/17 1900) Pulse Rate: 77 (04/17 1900) Intake/Output from previous day: 04/16 0701 - 04/17 0700 In: 56.8 [I.V.:56.8] Out: 950 [Urine:950] Intake/Output from this shift: No intake/output data recorded.  Labs:  Recent Labs  04/14/17 1622  04/15/17 0550 04/15/17 1303 04/15/17 1824  WBC 10.8  --  11.7*  --   --   HGB 8.9*  --  9.4*  --   --   HCT 25.8*  --  27.2*  --   --   PLT 274  --  283  --   --   CREATININE 1.29*  < > 1.61* 1.76* 1.85*  MG  --   --  2.1  --   --   PHOS  --   --  6.6*  --   --   ALBUMIN 3.1*  --  3.4*  --   --   PROT 5.7*  --  6.2*  --   --   AST 26  --  16  --   --   ALT 8*  --  10*  --   --   ALKPHOS 92  --  102  --   --   BILITOT 6.4*  --  7.6*  --   --   BILIDIR  --   --  3.5*  --   --   < > = values in this interval not displayed. .  Estimated Creatinine Clearance: 35.8 mL/min (A) (by C-G formula based on SCr of 1.85 mg/dL (H)).   Microbiology: Recent Results (from the past 720 hour(s))  MRSA PCR Screening     Status: None   Collection Time: 04/14/17  9:28 PM  Result Value Ref Range Status   MRSA by PCR NEGATIVE NEGATIVE Final    Comment:        The GeneXpert MRSA Assay (FDA approved for NASAL specimens only), is one component of a comprehensive MRSA colonization surveillance program. It is not intended to diagnose MRSA infection nor to guide or monitor treatment for MRSA infections.   CULTURE, BLOOD (ROUTINE X 2) w Reflex to ID Panel     Status: None (Preliminary result)   Collection Time: 04/14/17 10:37 PM  Result Value Ref Range Status   Specimen Description BLOOD RIGHT WRIST  Final   Special  Requests   Final    BOTTLES DRAWN AEROBIC AND ANAEROBIC Blood Culture adequate volume   Culture NO GROWTH < 12 HOURS  Final   Report Status PENDING  Incomplete  CULTURE, BLOOD (ROUTINE X 2) w Reflex to ID Panel     Status: None (Preliminary result)   Collection Time: 04/14/17 10:47 PM  Result Value Ref Range Status   Specimen Description BLOOD LEFT WRIST  Final   Special Requests   Final    BOTTLES DRAWN AEROBIC AND ANAEROBIC Blood Culture adequate volume   Culture NO GROWTH < 12 HOURS  Final   Report Status PENDING  Incomplete  Body fluid culture     Status: None (Preliminary result)   Collection Time: 04/15/17 11:20 AM  Result Value Ref Range Status   Specimen Description PERITONEAL  Final   Special Requests NONE  Final  Gram Stain   Final    RARE WBC PRESENT,BOTH PMN AND MONONUCLEAR NO ORGANISMS SEEN Performed at [facility], [place] [address], [place], [place] [address]    Culture PENDING  Incomplete   Report Status PENDING  Incomplete    Medical History: Past Medical History:  Diagnosis Date  . Congestive heart failure (CHF) ([place]) 06/27/15  . COPD (chronic obstructive pulmonary disease) ([place]) 06/27/15  . Hyperbilirubinemia   . 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 Rectal mass   . Sickle cell anemia (HCC)     Assessment: 46 y/o F admitted with acute on chronic respiratory failure.   Plan:  Electrolytes are WNL. Will continue to follow BMETs and replace electrolytes PRN.   Shannon Schneider , PharmD 04/15/2017,7:07 PM

## 2017-04-15 NOTE — Care Management (Signed)
Attempted to meet with patient while in ICU but is sleeping and receiving HFO2. Per last CM assessment patient has scales to weigh herself and on O2 dependent at home. RNCM will need to follow.

## 2017-04-15 NOTE — Procedures (Signed)
US paracentesis.  Removed 1 liter of yellow ascites from right abdomen.  Therapeutic paracentesis not performed due to hypotension.  Minimal blood loss and no immediate complication.

## 2017-04-15 NOTE — Progress Notes (Signed)
[place] for apixaban Indication: atrial fibrillation  No Known Allergies  Patient Measurements: Height: 5' (152.4 cm) Weight: 177 lb 12.8 oz (80.6 kg) IBW/kg (Calculated) : 45.5   Vital Signs: Temp: 97 F (36.1 C) (04/17 0800) Temp Source: Axillary (04/17 0800) BP: 79/65 (04/17 1030) Pulse Rate: 76 (04/17 1030)  Labs:  Recent Labs  04/14/17 1622 04/14/17 2140 04/15/17 0145 04/15/17 0550 04/15/17 1303  HGB 8.9*  --   --  9.4*  --   HCT 25.8*  --   --  27.2*  --   PLT 274  --   --  283  --   CREATININE 1.29*  --  1.66* 1.61* 1.76*  TROPONINI <0.03 0.13* 0.19*  --  0.14*    Estimated Creatinine Clearance: 37.6 mL/min (A) (by C-G formula based on SCr of 1.76 mg/dL (H)).   Medical History: Past Medical History:  Diagnosis Date  . Congestive heart failure (CHF) ([place]) 06/27/15  . COPD (chronic obstructive pulmonary disease) ([place]) 06/27/15  . Hyperbilirubinemia   . 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 Rectal mass   . Sickle cell anemia (HCC)    Assessment: 46 y/o F with a h/o atrial fibrillation/flutter on chronic anticoagulation with rivaroxaban. Patient admitted with acute on chronic respiratory failure due to pulmonary hypertension and acute renal failure.   Plan:  Will transition patient to apixaban at a dose of 5 mg bid.   Ulice Dash D 04/15/2017,2:13 PM

## 2017-04-15 NOTE — Progress Notes (Signed)
   04/15/17 1000  Output (mL)  Urine 350 mL

## 2017-04-15 NOTE — Progress Notes (Signed)
[facility] NAME: Shannon Schneider    MR#:  [id number]  DATE OF BIRTH:  [birth year]  SUBJECTIVE:  CHIEF COMPLAINT:   Chief Complaint  Patient presents with  . Shortness of Breath  The patient is 46 year old African-American female with past medical history significant for history of chronic diastolic CHF, severe pulmonary hypertension, moderate to severe mitral regurgitation and severe tricuspid regurgitation, COPD,, chronic respiratory failure, on 2 L of oxygen nasal cannula at home, who presents to the hospital with complaints of shortness of breath, dyspnea on exertion, PND, orthopnea. She was seen by cardiologist, few days ago, who advance her diuretic, on arrival to emergency room, she was noted to be hypotensive, systolic blood pressure in 70s to 80s. Due to concerns of right heart failure, acute cor pulmonale. Patient was admitted to intensive care unit for IV dopamine, REVATIO administration, she remains on high flow oxygen, at 35% FiO2, oxygen saturation is 94%. Patient feels comfortable. She was seen by cardiologist, quit recommended to initiate patient digoxin if kidney function improves.  Review of Systems  Constitutional: Negative for chills, fever and weight loss.  HENT: Negative for congestion.   Eyes: Negative for blurred vision and double vision.  Respiratory: Positive for cough and shortness of breath. Negative for sputum production and wheezing.   Cardiovascular: Positive for leg swelling. Negative for chest pain, palpitations, orthopnea and PND.  Gastrointestinal: Negative for abdominal pain, blood in stool, constipation, diarrhea, nausea and vomiting.  Genitourinary: Negative for dysuria, frequency, hematuria and urgency.  Musculoskeletal: Negative for falls.  Neurological: Negative for dizziness, tremors, focal weakness and headaches.  Endo/Heme/Allergies: Does not bruise/bleed easily.  Psychiatric/Behavioral:  Negative for depression. The patient does not have insomnia.     VITAL SIGNS: Blood pressure (!) 79/65, pulse 76, temperature 97 F (36.1 C), temperature source Axillary, resp. rate 18, height 5' (1.524 m), weight 80.6 kg (177 lb 12.8 oz), last menstrual period 04/14/2017, SpO2 96 %.  PHYSICAL EXAMINATION:   GENERAL:  46 y.o.-year-old patient lying in the bed in mild to moderate respiratory distress, tachypneic, now on high flow oxygen nasal cannulas.  EYES: Pupils equal, round, reactive to light and accommodation. No scleral icterus. Extraocular muscles intact.  HEENT: Head atraumatic, normocephalic. Oropharynx and nasopharynx clear.  NECK:  Supple, no jugular venous distention. No thyroid enlargement, no tenderness.  LUNGS: Normal breath sounds bilaterally, no wheezing, rales,rhonchi or crepitation. Intermittent use of accessory muscles of respiration.  CARDIOVASCULAR: S1, S2 normal. No murmurs, rubs, or gallops.  ABDOMEN: Soft, nontender, some distended. fluid wave was percussed. Bowel sounds present. No organomegaly or mass.  EXTREMITIES: 2+ lower extremity and pedal edema, no cyanosis, or clubbing.  NEUROLOGIC: Cranial nerves II through XII are intact. Muscle strength 5/5 in all extremities. Sensation intact. Gait not checked.  PSYCHIATRIC: The patient is alert and oriented x 3.  SKIN: No obvious rash, lesion, or ulcer.   ORDERS/RESULTS REVIEWED:   CBC  Recent Labs Lab 04/14/17 1622 04/15/17 0550  WBC 10.8 11.7*  HGB 8.9* 9.4*  HCT 25.8* 27.2*  PLT 274 283  MCV 116.4* 113.7*  MCH 40.2* 39.3*  MCHC 34.6 34.6  RDW 22.4* 22.5*  LYMPHSABS 1.2  --   MONOABS 0.8  --   EOSABS 0.0  --   BASOSABS 0.0  --    ------------------------------------------------------------------------------------------------------------------  Chemistries   Recent Labs Lab 04/14/17 1622 04/15/17 0145 04/15/17 0550 04/15/17 1303  NA 132* 133* 132* 133*  K 5.0 4.0 3.8 4.0  CL 106 100* 100*  100*  CO2 17* 22 22 23   GLUCOSE 91 124* 120* 107*  BUN 28* 36* 35* 38*  CREATININE 1.29* 1.66* 1.61* 1.76*  CALCIUM 7.5* 8.9 9.0 8.7*  MG  --   --  2.1  --   AST 26  --  16  --   ALT 8*  --  10*  --   ALKPHOS 92  --  102  --   BILITOT 6.4*  --  7.6*  --    ------------------------------------------------------------------------------------------------------------------ estimated creatinine clearance is 37.6 mL/min (A) (by C-G formula based on SCr of 1.76 mg/dL (H)). ------------------------------------------------------------------------------------------------------------------ No results for input(s): TSH, T4TOTAL, T3FREE, THYROIDAB in the last 72 hours.  Invalid input(s): FREET3  Cardiac Enzymes  Recent Labs Lab 04/14/17 2140 04/15/17 0145 04/15/17 1303  TROPONINI 0.13* 0.19* 0.14*   ------------------------------------------------------------------------------------------------------------------ Invalid input(s): POCBNP ---------------------------------------------------------------------------------------------------------------  RADIOLOGY: Dg Chest 1 View  Result Date: 04/14/2017 CLINICAL DATA:  PICC line placement. EXAM: CHEST 1 VIEW COMPARISON:  Chest radiograph April 14, 2017 at 1616 hours FINDINGS: RIGHT PICC distal tip projects an proximal to mid superior vena cava. Stable cardiomegaly. Mediastinal silhouette is nonsuspicious. Small, decreased RIGHT pleural effusion with underlying airspace opacity. No pneumothorax. Soft tissue planes and included osseous structures are nonsuspicious. IMPRESSION: RIGHT PICC distal tip projects in proximal to mid superior vena cava, no pneumothorax. Stable cardiomegaly, superimposed pericardial effusion is possible. Small residual RIGHT pleural effusion with underlying airspace opacity. Electronically Signed   By: Elon Alas M.D.   On: 04/14/2017 20:54   US Venous Img Lower Bilateral  Result Date: 04/14/2017 CLINICAL DATA:   Bilateral lower extremity edema for 1 month. EXAM: BILATERAL LOWER EXTREMITY VENOUS DOPPLER ULTRASOUND TECHNIQUE: Gray-scale sonography with graded compression, as well as color Doppler and duplex ultrasound were performed to evaluate the lower extremity deep venous systems from the level of the common femoral vein and including the common femoral, femoral, profunda femoral, popliteal and calf veins including the posterior tibial, peroneal and gastrocnemius veins when visible. The superficial great saphenous vein was also interrogated. Spectral Doppler was utilized to evaluate flow at rest and with distal augmentation maneuvers in the common femoral, femoral and popliteal veins. COMPARISON:  None. FINDINGS: RIGHT LOWER EXTREMITY Common Femoral Vein: No evidence of thrombus. Normal compressibility, respiratory phasicity and response to augmentation. Saphenofemoral Junction: No evidence of thrombus. Normal compressibility and flow on color Doppler imaging. Profunda Femoral Vein: No evidence of thrombus. Normal compressibility and flow on color Doppler imaging. Femoral Vein: No evidence of thrombus. Normal compressibility, respiratory phasicity and response to augmentation. Popliteal Vein: No evidence of thrombus. Normal compressibility, respiratory phasicity and response to augmentation. Calf Veins: No evidence of thrombus. Normal compressibility and flow on color Doppler imaging. Superficial Great Saphenous Vein: No evidence of thrombus. Normal compressibility and flow on color Doppler imaging. Venous Reflux:  None. Other Findings: Ill-defined edema within the subcutaneous soft tissues, most prominent at the level of the calf LEFT LOWER EXTREMITY Common Femoral Vein: No evidence of thrombus. Normal compressibility, respiratory phasicity and response to augmentation. Saphenofemoral Junction: No evidence of thrombus. Normal compressibility and flow on color Doppler imaging. Profunda Femoral Vein: No evidence of  thrombus. Normal compressibility and flow on color Doppler imaging. Femoral Vein: No evidence of thrombus. Normal compressibility, respiratory phasicity and response to augmentation. Popliteal Vein: No evidence of thrombus. Normal compressibility, respiratory phasicity and response to augmentation. Calf Veins: No evidence of thrombus. Normal compressibility and flow  on color Doppler imaging. Superficial Great Saphenous Vein: No evidence of thrombus. Normal compressibility and flow on color Doppler imaging. Venous Reflux:  None. Other Findings: Ill-defined edema within the subcutaneous soft tissues, most prominent at the level of the calf. IMPRESSION: 1. No evidence of DVT, bilateral lower extremities. 2. Ill-defined edema within the subcutaneous soft tissues of both lower extremities, most prominent at the level of the calves. Electronically Signed   By: Franki Cabot M.D.   On: 04/14/2017 19:58   US Paracentesis  Result Date: 04/15/2017 INDICATION: 46 year old with ascites.  Request for a diagnostic paracentesis. EXAM: ULTRASOUND GUIDED PARACENTESIS MEDICATIONS: None. COMPLICATIONS: None immediate. PROCEDURE: Informed written consent was obtained from the patient after a discussion of the risks, benefits and alternatives to treatment. A timeout was performed prior to the initiation of the procedure. Initial ultrasound scanning demonstrates a large amount of ascites within the right lower abdominal quadrant. The right lower abdomen was prepped and draped in the usual sterile fashion. 1% lidocaine was used for local anesthesia. Following this, a 19 gauge, 10-cm, Yueh catheter was introduced. An ultrasound image was saved for documentation purposes. The paracentesis was performed. The catheter was removed and a dressing was applied. The patient tolerated the procedure well without immediate post procedural complication. FINDINGS: A total of approximately 1 L of yellow fluid was removed. Samples were sent to the  laboratory as requested by the clinical team. IMPRESSION: Successful ultrasound-guided paracentesis yielding 1 L of peritoneal fluid. Electronically Signed   By: Markus Daft M.D.   On: 04/15/2017 13:12   Dg Chest Port 1 View  Result Date: 04/15/2017 CLINICAL DATA:  Acute respiratory failure. EXAM: PORTABLE CHEST 1 VIEW COMPARISON:  04/14/2017 FINDINGS: Shallow inspiration. Diffuse cardiac enlargement. Pulmonary vascularity is normal. Small right pleural effusion. Atelectasis or consolidation in the right lung base. Left lung appears clear and expanded. Right PICC line with tip over the low SVC region. No pneumothorax. IMPRESSION: Cardiac enlargement. Small right pleural effusion with basilar atelectasis or consolidation. Electronically Signed   By: Lucienne Capers M.D.   On: 04/15/2017 05:36   Dg Chest Portable 1 View  Result Date: 04/14/2017 CLINICAL DATA:  Shortness of breath.  Lower extremity swelling. EXAM: PORTABLE CHEST 1 VIEW COMPARISON:  03/12/2017. FINDINGS: Marked enlargement of cardiac silhouette. Vascular congestion is improved. Increasing RIGHT pleural effusion. No consolidation. IMPRESSION: Improved CHF, but increasing RIGHT pleural effusion. Marked enlargement cardiac silhouette is stable. Electronically Signed   By: Staci Righter M.D.   On: 04/14/2017 16:51    EKG:  Orders placed or performed during the hospital encounter of 04/14/17  . ED EKG  . ED EKG  . EKG 12-Lead  . EKG 12-Lead  . EKG 12-Lead  . EKG 12-Lead    ASSESSMENT AND PLAN:  Active Problems:   CHF (congestive heart failure) (HCC)   Ascites   Increased liver enzymes   Acute on chronic respiratory failure with hypoxia (HCC)   Moderate COPD (chronic obstructive pulmonary disease) (HCC)   Moderate to severe pulmonary hypertension (HCC)   Hb-SS disease without crisis ([place])   Cor pulmonale (chronic) ([place])  #1. Acute cor pulmonale, cardiogenic shock, patient is initiated on dopamine, sildenafil, may benefit from  digoxin, continue oxygen at high flow, intensivist is involved, appreciate cardiology's input #2. Acute renal insufficiency, now on dopamine and follow creatinine the morning, not on diuretic #3. Right-sided heart failure with ascites, status post paracentesis, lower extremity swelling, status post ultrasound, no DVT, follow closely for hypotension  after paracentesis, ascitic fluid was noted to have a 400 white blood cells, ? SBP, however, procalcitonin level is normal #4. Hyponatremia, seems to be stable in fluid overloaded. Patient #5. Elevated troponin, likely demand ischemia #6 . Leukocytosis, questionable stress, cannot rule out infection, not on antibiotics at this time, blood cultures and negative for less than 12 hours, peritoneal fluid, urine cultures are pending, MRSA PCR is negative Management plans discussed with the patient, family and they are in agreement.   DRUG ALLERGIES: No Known Allergies  CODE STATUS:     Code Status Orders        Start     Ordered   04/14/17 2046  Full code  Continuous     04/14/17 2045    Code Status History    Date Active Date Inactive Code Status Order ID Comments User Context   03/12/2017  7:32 PM 03/17/2017  7:35 PM Full Code [id number]  Gladstone Lighter, MD Inpatient   12/30/2016  4:15 PM 01/02/2017  7:09 PM Full Code [id number]  Hillary Bow, MD ED   06/27/2015  1:31 PM 06/29/2015  5:59 PM Full Code [id number]  Henreitta Leber, MD Inpatient      TOTAL TIME TAKING CARE OF THIS PATIENT: 40  minutes.  Discussed with patient's family, all questions were answered  Emryn Flanery M.D on 04/15/2017 at 4:07 PM  Between 7am to 6pm - Pager - [phone]  After 6pm go to [email or web address] - password [email or web address] [facility]  Office  [phone]  CC: Primary care physician; Danelle Berry, NP

## 2017-04-15 NOTE — Progress Notes (Signed)
Shannon Schneider is a 46 y.o. female  [id number]  Primary Cardiologist: Neoma Laming Reason for Consultation: Congestive heart failure and hypotension  HPI: This is a 46 year old African-American female with a history of congestive heart failure and sickle cell anemia and COPD presented to the hospital with weakness shortness of breath and swelling of the legs and was found to have blood pressure systolic 70. She says she's been feeling weak and could not take it anymore.   Review of Systems: No chest pain but does have leg swelling and shortness of breath orthopnea PND   Past Medical History:  Diagnosis Date  . Congestive heart failure (CHF) ([place]) 06/27/15  . COPD (chronic obstructive pulmonary disease) ([place]) 06/27/15  . Hyperbilirubinemia   . 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 Rectal mass   . Sickle cell anemia (HCC)     Medications Prior to Admission  Medication Sig Dispense Refill  . albuterol (PROVENTIL HFA;VENTOLIN HFA) 108 (90 Base) MCG/ACT inhaler Inhale 2 puffs into the lungs every 6 (six) hours as needed for wheezing.    . citalopram (CELEXA) 10 MG tablet Take 10 mg by mouth at bedtime.    . Ergocalciferol 2000 units TABS Take 2,000 Units by mouth daily.     . furosemide (LASIX) 20 MG tablet Take 1 tablet (20 mg total) by mouth daily. 30 tablet 0  . loratadine (CLARITIN) 10 MG tablet Take 10 mg by mouth daily.    . metolazone (ZAROXOLYN) 5 MG tablet Take 5 mg by mouth 2 (two) times daily.    . metoprolol succinate (TOPROL-XL) 100 MG 24 hr tablet Take 100 mg by mouth daily. Take with or immediately following a meal.    . mometasone-formoterol (DULERA) 200-5 MCG/ACT AERO Inhale 2 puffs into the lungs 2 (two) times daily. 1 Inhaler 0  . rivaroxaban (XARELTO) 20 MG TABS tablet Take 1 tablet (20 mg total) by mouth daily. 30 tablet 0  . spironolactone (ALDACTONE) 25 MG tablet Take 25 mg by mouth daily.    . Tiotropium Bromide Monohydrate (SPIRIVA RESPIMAT) 2.5 MCG/ACT AERS Inhale 1 puff  into the lungs 2 (two) times daily.    . OXYGEN Inhale 2 L into the lungs continuous.        . chlorhexidine  15 mL Mouth Rinse BID  . cholecalciferol  2,000 Units Oral Daily  . citalopram  10 mg Oral QHS  . loratadine  10 mg Oral Daily  . mouth rinse  15 mL Mouth Rinse q12n4p  . mometasone-formoterol  2 puff Inhalation BID  . sodium chloride flush  3 mL Intravenous Q12H  . sodium chloride flush  3 mL Intravenous Q12H  . tiotropium  1 capsule Inhalation Daily    Infusions: . sodium chloride    . DOPamine 5 mcg/kg/min (04/15/17 0801)    No Known Allergies  Social History   Social History  . Marital status: Married    Spouse name: N/A  . Number of children: N/A  . Years of education: N/A   Occupational History  . Not on file.   Social History Main Topics  . Smoking status: Current Every Day Smoker    Packs/day: 0.25    Years: 20.00    Types: Cigarettes  . Smokeless tobacco: Never Used  . Alcohol use 0.0 oz/week     Comment: occasionally  . Drug use: No  . Sexual activity: Not on file   Other Topics Concern  . Not on file   Social History  Narrative   Independent at baseline    Family History  Problem Relation Age of Onset  . Renal Disease Mother   . Diabetes Mother   . Hypertension Mother   . Emphysema Father   . Breast cancer Neg Hx     PHYSICAL EXAM: Vitals:   04/15/17 0700 04/15/17 0800  BP: (!) 88/66 92/67  Pulse: 75 76  Resp: 17 16  Temp:  97 F (36.1 C)     Intake/Output Summary (Last 24 hours) at 04/15/17 0906 Last data filed at 04/15/17 0600  Gross per 24 hour  Intake             56.8 ml  Output              950 ml  Net           -893.2 ml    General:  Well appearing. No respiratory difficulty HEENT: normal Neck: supple. no JVD. Carotids 2+ bilat; no bruits. No lymphadenopathy or thryomegaly appreciated. Cor: PMI nondisplaced. Regular rate & rhythm. No rubs, gallops or murmurs. Lungs: clear Abdomen: soft, nontender,  nondistended. No hepatosplenomegaly. No bruits or masses. Good bowel sounds. Extremities: no cyanosis, clubbing, rash, edema Neuro: alert & oriented x 3, cranial nerves grossly intact. moves all 4 extremities w/o difficulty. Affect pleasant.  ECG: Atrial flutter with right bundle branch block and RVH  Results for orders placed or performed during the hospital encounter of 04/14/17 (from the past 24 hour(s))  Comprehensive metabolic panel     Status: Abnormal   Collection Time: 04/14/17  4:22 PM  Result Value Ref Range   Sodium 132 (L) 135 - 145 mmol/L   Potassium 5.0 3.5 - 5.1 mmol/L   Chloride 106 101 - 111 mmol/L   CO2 17 (L) 22 - 32 mmol/L   Glucose, Bld 91 65 - 99 mg/dL   BUN 28 (H) 6 - 20 mg/dL   Creatinine, Ser 1.29 (H) 0.44 - 1.00 mg/dL   Calcium 7.5 (L) 8.9 - 10.3 mg/dL   Total Protein 5.7 (L) 6.5 - 8.1 g/dL   Albumin 3.1 (L) 3.5 - 5.0 g/dL   AST 26 15 - 41 U/L   ALT 8 (L) 14 - 54 U/L   Alkaline Phosphatase 92 38 - 126 U/L   Total Bilirubin 6.4 (H) 0.3 - 1.2 mg/dL   GFR calc non Af Amer 49 (L) >60 mL/min   GFR calc Af Amer 57 (L) >60 mL/min   Anion gap 9 5 - 15  Brain natriuretic peptide     Status: Abnormal   Collection Time: 04/14/17  4:22 PM  Result Value Ref Range   B Natriuretic Peptide 1,145.0 (H) 0.0 - 100.0 pg/mL  Troponin I     Status: None   Collection Time: 04/14/17  4:22 PM  Result Value Ref Range   Troponin I <0.03 <0.03 ng/mL  CBC with Differential     Status: Abnormal   Collection Time: 04/14/17  4:22 PM  Result Value Ref Range   WBC 10.8 3.6 - 11.0 K/uL   RBC 2.22 (L) 3.80 - 5.20 MIL/uL   Hemoglobin 8.9 (L) 12.0 - 16.0 g/dL   HCT 25.8 (L) 35.0 - 47.0 %   MCV 116.4 (H) 80.0 - 100.0 fL   MCH 40.2 (H) 26.0 - 34.0 pg   MCHC 34.6 32.0 - 36.0 g/dL   RDW 22.4 (H) 11.5 - 14.5 %   Platelets 274 150 - 440 K/uL   Neutrophils Relative %  82 %   Lymphocytes Relative 11 %   Monocytes Relative 7 %   Eosinophils Relative 0 %   Basophils Relative 0 %   Band  Neutrophils 0 %   Metamyelocytes Relative 0 %   Myelocytes 0 %   Promyelocytes Absolute 0 %   Blasts 0 %   nRBC 41 (H) 0 /100 WBC   Other 0 %   Neutro Abs 8.8 (H) 1.4 - 6.5 K/uL   Lymphs Abs 1.2 1.0 - 3.6 K/uL   Monocytes Absolute 0.8 0.2 - 0.9 K/uL   Eosinophils Absolute 0.0 0 - 0.7 K/uL   Basophils Absolute 0.0 0 - 0.1 K/uL   RBC Morphology BURR CELLS    WBC Morphology PREVIOUSLY SENT FOR PATH REVIEW   Glucose, capillary     Status: Abnormal   Collection Time: 04/14/17  9:14 PM  Result Value Ref Range   Glucose-Capillary 107 (H) 65 - 99 mg/dL  MRSA PCR Screening     Status: None   Collection Time: 04/14/17  9:28 PM  Result Value Ref Range   MRSA by PCR NEGATIVE NEGATIVE  Troponin I (q 6hr x 3)     Status: Abnormal   Collection Time: 04/14/17  9:40 PM  Result Value Ref Range   Troponin I 0.13 (HH) <0.03 ng/mL  Procalcitonin - Baseline     Status: None   Collection Time: 04/14/17  9:40 PM  Result Value Ref Range   Procalcitonin 0.33 ng/mL  CULTURE, BLOOD (ROUTINE X 2) w Reflex to ID Panel     Status: None (Preliminary result)   Collection Time: 04/14/17 10:37 PM  Result Value Ref Range   Specimen Description BLOOD RIGHT WRIST    Special Requests      BOTTLES DRAWN AEROBIC AND ANAEROBIC Blood Culture adequate volume   Culture NO GROWTH < 12 HOURS    Report Status PENDING   CULTURE, BLOOD (ROUTINE X 2) w Reflex to ID Panel     Status: None (Preliminary result)   Collection Time: 04/14/17 10:47 PM  Result Value Ref Range   Specimen Description BLOOD LEFT WRIST    Special Requests      BOTTLES DRAWN AEROBIC AND ANAEROBIC Blood Culture adequate volume   Culture NO GROWTH < 12 HOURS    Report Status PENDING   Urinalysis, Complete w Microscopic     Status: Abnormal   Collection Time: 04/14/17 11:40 PM  Result Value Ref Range   Color, Urine AMBER (A) YELLOW   APPearance HAZY (A) CLEAR   Specific Gravity, Urine 1.008 1.005 - 1.030   pH 5.0 5.0 - 8.0   Glucose, UA NEGATIVE  NEGATIVE mg/dL   Hgb urine dipstick NEGATIVE NEGATIVE   Bilirubin Urine NEGATIVE NEGATIVE   Ketones, ur NEGATIVE NEGATIVE mg/dL   Protein, ur NEGATIVE NEGATIVE mg/dL   Nitrite NEGATIVE NEGATIVE   Leukocytes, UA NEGATIVE NEGATIVE   RBC / HPF 0-5 0 - 5 RBC/hpf   WBC, UA 0-5 0 - 5 WBC/hpf   Bacteria, UA RARE (A) NONE SEEN   Squamous Epithelial / LPF 0-5 (A) NONE SEEN   Mucous PRESENT    Hyaline Casts, UA PRESENT   Pregnancy, urine     Status: None   Collection Time: 04/14/17 11:40 PM  Result Value Ref Range   Preg Test, Ur NEGATIVE NEGATIVE  Glucose, capillary     Status: Abnormal   Collection Time: 04/15/17 12:08 AM  Result Value Ref Range   Glucose-Capillary 119 (H) 65 -  99 mg/dL  Basic metabolic panel     Status: Abnormal   Collection Time: 04/15/17  1:45 AM  Result Value Ref Range   Sodium 133 (L) 135 - 145 mmol/L   Potassium 4.0 3.5 - 5.1 mmol/L   Chloride 100 (L) 101 - 111 mmol/L   CO2 22 22 - 32 mmol/L   Glucose, Bld 124 (H) 65 - 99 mg/dL   BUN 36 (H) 6 - 20 mg/dL   Creatinine, Ser 1.66 (H) 0.44 - 1.00 mg/dL   Calcium 8.9 8.9 - 10.3 mg/dL   GFR calc non Af Amer 36 (L) >60 mL/min   GFR calc Af Amer 42 (L) >60 mL/min   Anion gap 11 5 - 15  Troponin I (q 6hr x 3)     Status: Abnormal   Collection Time: 04/15/17  1:45 AM  Result Value Ref Range   Troponin I 0.19 (HH) <0.03 ng/mL  Comprehensive metabolic panel     Status: Abnormal   Collection Time: 04/15/17  5:50 AM  Result Value Ref Range   Sodium 132 (L) 135 - 145 mmol/L   Potassium 3.8 3.5 - 5.1 mmol/L   Chloride 100 (L) 101 - 111 mmol/L   CO2 22 22 - 32 mmol/L   Glucose, Bld 120 (H) 65 - 99 mg/dL   BUN 35 (H) 6 - 20 mg/dL   Creatinine, Ser 1.61 (H) 0.44 - 1.00 mg/dL   Calcium 9.0 8.9 - 10.3 mg/dL   Total Protein 6.2 (L) 6.5 - 8.1 g/dL   Albumin 3.4 (L) 3.5 - 5.0 g/dL   AST 16 15 - 41 U/L   ALT 10 (L) 14 - 54 U/L   Alkaline Phosphatase 102 38 - 126 U/L   Total Bilirubin 7.6 (H) 0.3 - 1.2 mg/dL   GFR calc non  Af Amer 37 (L) >60 mL/min   GFR calc Af Amer 43 (L) >60 mL/min   Anion gap 10 5 - 15  CBC     Status: Abnormal   Collection Time: 04/15/17  5:50 AM  Result Value Ref Range   WBC 11.7 (H) 3.6 - 11.0 K/uL   RBC 2.39 (L) 3.80 - 5.20 MIL/uL   Hemoglobin 9.4 (L) 12.0 - 16.0 g/dL   HCT 27.2 (L) 35.0 - 47.0 %   MCV 113.7 (H) 80.0 - 100.0 fL   MCH 39.3 (H) 26.0 - 34.0 pg   MCHC 34.6 32.0 - 36.0 g/dL   RDW 22.5 (H) 11.5 - 14.5 %   Platelets 283 150 - 440 K/uL  Magnesium     Status: None   Collection Time: 04/15/17  5:50 AM  Result Value Ref Range   Magnesium 2.1 1.7 - 2.4 mg/dL  Procalcitonin     Status: None   Collection Time: 04/15/17  5:50 AM  Result Value Ref Range   Procalcitonin 0.42 ng/mL  Phosphorus     Status: Abnormal   Collection Time: 04/15/17  5:50 AM  Result Value Ref Range   Phosphorus 6.6 (H) 2.5 - 4.6 mg/dL  Bilirubin, direct     Status: Abnormal   Collection Time: 04/15/17  5:50 AM  Result Value Ref Range   Bilirubin, Direct 3.5 (H) 0.1 - 0.5 mg/dL   Dg Chest 1 View  Result Date: 04/14/2017 CLINICAL DATA:  PICC line placement. EXAM: CHEST 1 VIEW COMPARISON:  Chest radiograph April 14, 2017 at 1616 hours FINDINGS: RIGHT PICC distal tip projects an proximal to mid superior vena cava. Stable cardiomegaly.  Mediastinal silhouette is nonsuspicious. Small, decreased RIGHT pleural effusion with underlying airspace opacity. No pneumothorax. Soft tissue planes and included osseous structures are nonsuspicious. IMPRESSION: RIGHT PICC distal tip projects in proximal to mid superior vena cava, no pneumothorax. Stable cardiomegaly, superimposed pericardial effusion is possible. Small residual RIGHT pleural effusion with underlying airspace opacity. Electronically Signed   By: Elon Alas M.D.   On: 04/14/2017 20:54   US Venous Img Lower Bilateral  Result Date: 04/14/2017 CLINICAL DATA:  Bilateral lower extremity edema for 1 month. EXAM: BILATERAL LOWER EXTREMITY VENOUS DOPPLER  ULTRASOUND TECHNIQUE: Gray-scale sonography with graded compression, as well as color Doppler and duplex ultrasound were performed to evaluate the lower extremity deep venous systems from the level of the common femoral vein and including the common femoral, femoral, profunda femoral, popliteal and calf veins including the posterior tibial, peroneal and gastrocnemius veins when visible. The superficial great saphenous vein was also interrogated. Spectral Doppler was utilized to evaluate flow at rest and with distal augmentation maneuvers in the common femoral, femoral and popliteal veins. COMPARISON:  None. FINDINGS: RIGHT LOWER EXTREMITY Common Femoral Vein: No evidence of thrombus. Normal compressibility, respiratory phasicity and response to augmentation. Saphenofemoral Junction: No evidence of thrombus. Normal compressibility and flow on color Doppler imaging. Profunda Femoral Vein: No evidence of thrombus. Normal compressibility and flow on color Doppler imaging. Femoral Vein: No evidence of thrombus. Normal compressibility, respiratory phasicity and response to augmentation. Popliteal Vein: No evidence of thrombus. Normal compressibility, respiratory phasicity and response to augmentation. Calf Veins: No evidence of thrombus. Normal compressibility and flow on color Doppler imaging. Superficial Great Saphenous Vein: No evidence of thrombus. Normal compressibility and flow on color Doppler imaging. Venous Reflux:  None. Other Findings: Ill-defined edema within the subcutaneous soft tissues, most prominent at the level of the calf LEFT LOWER EXTREMITY Common Femoral Vein: No evidence of thrombus. Normal compressibility, respiratory phasicity and response to augmentation. Saphenofemoral Junction: No evidence of thrombus. Normal compressibility and flow on color Doppler imaging. Profunda Femoral Vein: No evidence of thrombus. Normal compressibility and flow on color Doppler imaging. Femoral Vein: No evidence of  thrombus. Normal compressibility, respiratory phasicity and response to augmentation. Popliteal Vein: No evidence of thrombus. Normal compressibility, respiratory phasicity and response to augmentation. Calf Veins: No evidence of thrombus. Normal compressibility and flow on color Doppler imaging. Superficial Great Saphenous Vein: No evidence of thrombus. Normal compressibility and flow on color Doppler imaging. Venous Reflux:  None. Other Findings: Ill-defined edema within the subcutaneous soft tissues, most prominent at the level of the calf. IMPRESSION: 1. No evidence of DVT, bilateral lower extremities. 2. Ill-defined edema within the subcutaneous soft tissues of both lower extremities, most prominent at the level of the calves. Electronically Signed   By: Franki Cabot M.D.   On: 04/14/2017 19:58   Dg Chest Port 1 View  Result Date: 04/15/2017 CLINICAL DATA:  Acute respiratory failure. EXAM: PORTABLE CHEST 1 VIEW COMPARISON:  04/14/2017 FINDINGS: Shallow inspiration. Diffuse cardiac enlargement. Pulmonary vascularity is normal. Small right pleural effusion. Atelectasis or consolidation in the right lung base. Left lung appears clear and expanded. Right PICC line with tip over the low SVC region. No pneumothorax. IMPRESSION: Cardiac enlargement. Small right pleural effusion with basilar atelectasis or consolidation. Electronically Signed   By: Lucienne Capers M.D.   On: 04/15/2017 05:36   Dg Chest Portable 1 View  Result Date: 04/14/2017 CLINICAL DATA:  Shortness of breath.  Lower extremity swelling. EXAM: PORTABLE CHEST 1 VIEW COMPARISON:  03/12/2017. FINDINGS: Marked enlargement of cardiac silhouette. Vascular congestion is improved. Increasing RIGHT pleural effusion. No consolidation. IMPRESSION: Improved CHF, but increasing RIGHT pleural effusion. Marked enlargement cardiac silhouette is stable. Electronically Signed   By: Staci Righter M.D.   On: 04/14/2017 16:51     ASSESSMENT AND PLAN: Severe  pulmonary hypertension leading to pressure overload of the left ventricle with severely dilated right ventricle and right atrium and hypotension. Left reticular systolic function is normal. Swelling of the legs is related to pulmonary hypertension. She has history of sickle cell anemia and has always been having blood pressure on the low side. Advise starting the patient on Lasix drip and consider digoxin once creatinine improves.  Vandana Haman A

## 2017-04-15 NOTE — Progress Notes (Signed)
   04/15/17 1000  Unmeasured Output  Emesis Occurrence 1

## 2017-04-16 LAB — BASIC METABOLIC PANEL
Anion gap: 7 (ref 5–15)
BUN: 38 mg/dL — ABNORMAL HIGH (ref 6–20)
CHLORIDE: 99 mmol/L — AB (ref 101–111)
CO2: 24 mmol/L (ref 22–32)
CREATININE: 1.65 mg/dL — AB (ref 0.44–1.00)
Calcium: 8.6 mg/dL — ABNORMAL LOW (ref 8.9–10.3)
GFR calc non Af Amer: 36 mL/min — ABNORMAL LOW (ref >60)
GFR, EST AFRICAN AMERICAN: 42 mL/min — AB (ref >60)
Glucose, Bld: 211 mg/dL — ABNORMAL HIGH (ref 65–99)
Potassium: 3.8 mmol/L (ref 3.5–5.1)
Sodium: 130 mmol/L — ABNORMAL LOW (ref 135–145)

## 2017-04-16 LAB — CYTOLOGY - NON PAP

## 2017-04-16 LAB — URINE CULTURE: CULTURE: NO GROWTH

## 2017-04-16 LAB — PROCALCITONIN: Procalcitonin: 0.51 ng/mL

## 2017-04-16 LAB — MISC LABCORP TEST (SEND OUT): LABCORP TEST CODE: 19588

## 2017-04-16 LAB — HEPATITIS PANEL, ACUTE
HEP B S AG: NEGATIVE
Hep A IgM: NEGATIVE
Hep B C IgM: NEGATIVE

## 2017-04-16 MED ORDER — POTASSIUM CHLORIDE CRYS ER 20 MEQ PO TBCR
20.0000 meq | EXTENDED_RELEASE_TABLET | Freq: Once | ORAL | Status: AC
  Administered 2017-04-16: 20 meq via ORAL
  Filled 2017-04-16: qty 1

## 2017-04-16 MED ORDER — ALBUMIN HUMAN 5 % IV SOLN
12.5000 g | Freq: Once | INTRAVENOUS | Status: AC
  Administered 2017-04-16: 12.5 g via INTRAVENOUS
  Filled 2017-04-16: qty 250

## 2017-04-16 MED ORDER — FUROSEMIDE 10 MG/ML IJ SOLN
40.0000 mg | Freq: Once | INTRAMUSCULAR | Status: AC
  Administered 2017-04-16: 40 mg via INTRAVENOUS
  Filled 2017-04-16: qty 4

## 2017-04-16 MED ORDER — SODIUM CHLORIDE 0.9 % IV SOLN
1.0000 mg | Freq: Once | INTRAVENOUS | Status: AC
  Administered 2017-04-16: 1 mg via INTRAVENOUS
  Filled 2017-04-16: qty 0.2

## 2017-04-16 MED ORDER — MORPHINE SULFATE (PF) 4 MG/ML IV SOLN
1.0000 mg | INTRAVENOUS | Status: DC | PRN
  Administered 2017-04-17: 1 mg via INTRAVENOUS
  Administered 2017-04-17: 2 mg via INTRAVENOUS
  Administered 2017-04-18: 1 mg via INTRAVENOUS
  Administered 2017-04-18: 2 mg via INTRAVENOUS
  Administered 2017-04-18: 1 mg via INTRAVENOUS
  Administered 2017-04-19 – 2017-04-20 (×5): 2 mg via INTRAVENOUS
  Filled 2017-04-16 (×10): qty 1

## 2017-04-16 MED ORDER — IPRATROPIUM-ALBUTEROL 0.5-2.5 (3) MG/3ML IN SOLN
3.0000 mL | Freq: Four times a day (QID) | RESPIRATORY_TRACT | Status: DC | PRN
  Administered 2017-04-19: 3 mL via RESPIRATORY_TRACT
  Filled 2017-04-16: qty 3

## 2017-04-16 NOTE — Progress Notes (Signed)
SUBJECTIVE: Pt appears fatigued with SOB, cannot tolerate laying down in bed, must be upright.    Vitals:   04/16/17 1000 04/16/17 1100 04/16/17 1200 04/16/17 1300  BP: (!) 88/65 (!) 88/67 101/81 99/75  Pulse: (!) 117 (!) 105 (!) 120 (!) 117  Resp: 16 18 19 20   Temp:   97.4 F (36.3 C)   TempSrc:   Axillary   SpO2: 92% 92% 92% 92%  Weight:      Height:        Intake/Output Summary (Last 24 hours) at 04/16/17 1326 Last data filed at 04/16/17 1300  Gross per 24 hour  Intake              645 ml  Output             1725 ml  Net            -1080 ml    LABS: Basic Metabolic Panel:  Recent Labs  04/15/17 0550  04/15/17 1824 04/16/17 0443  NA 132*  < > 135 130*  K 3.8  < > 4.0 3.8  CL 100*  < > 102 99*  CO2 22  < > 23 24  GLUCOSE 120*  < > 133* 211*  BUN 35*  < > 37* 38*  CREATININE 1.61*  < > 1.85* 1.65*  CALCIUM 9.0  < > 9.1 8.6*  MG 2.1  --   --   --   PHOS 6.6*  --   --   --   < > = values in this interval not displayed. Liver Function Tests:  Recent Labs  04/14/17 1622 04/15/17 0550  AST 26 16  ALT 8* 10*  ALKPHOS 92 102  BILITOT 6.4* 7.6*  PROT 5.7* 6.2*  ALBUMIN 3.1* 3.4*   No results for input(s): LIPASE, AMYLASE in the last 72 hours. CBC:  Recent Labs  04/14/17 1622 04/15/17 0550  WBC 10.8 11.7*  NEUTROABS 8.8*  --   HGB 8.9* 9.4*  HCT 25.8* 27.2*  MCV 116.4* 113.7*  PLT 274 283   Cardiac Enzymes:  Recent Labs  04/14/17 2140 04/15/17 0145 04/15/17 1303  TROPONINI 0.13* 0.19* 0.14*   BNP: Invalid input(s): POCBNP D-Dimer: No results for input(s): DDIMER in the last 72 hours. Hemoglobin A1C: No results for input(s): HGBA1C in the last 72 hours. Fasting Lipid Panel: No results for input(s): CHOL, HDL, LDLCALC, TRIG, CHOLHDL, LDLDIRECT in the last 72 hours. Thyroid Function Tests: No results for input(s): TSH, T4TOTAL, T3FREE, THYROIDAB in the last 72 hours.  Invalid input(s): FREET3 Anemia Panel: No results for input(s):  VITAMINB12, FOLATE, FERRITIN, TIBC, IRON, RETICCTPCT in the last 72 hours.   PHYSICAL EXAM General: Well developed, well nourished, in no acute distress HEENT:  Normocephalic and atramatic Neck:  No JVD.  Lungs: Increased WOB noted. Heart: HRRR . Normal S1 and S2 without gallops or murmurs.  Abdomen: Abd is full, and tender Extremities: Bilateral pedal edema Neuro: Alert and oriented X 3. Psych:  Good affect, responds appropriately  TELEMETRY: NSR 94bpm  ASSESSMENT AND PLAN: CHF exacerbation secondary to severe pulmonary HTN. Known to have low blood pressures at baseline in our office of 80s/50s. Advise adding lasix drip as pt reports significant edema of abdomen and feet with shortness of breath. Agree with [facility] evaluation.   Active Problems:   CHF (congestive heart failure) (HCC)   Ascites   Increased liver enzymes   Acute on chronic respiratory failure with hypoxia ([place])  Moderate COPD (chronic obstructive pulmonary disease) (HCC)   Moderate to severe pulmonary hypertension (HCC)   Hb-SS disease without crisis ([place])   Cor pulmonale (chronic) ([place])    [name], NP-C 04/16/2017 1:26 PM

## 2017-04-16 NOTE — Progress Notes (Signed)
MEDICATION RELATED CONSULT NOTE - INITIAL   Pharmacy Consult for electrolyte monitoring and replacement if needed  No Known Allergies  Patient Measurements: Height: 5' (152.4 cm) Weight: 173 lb 8 oz (78.7 kg) IBW/kg (Calculated) : 45.5  Vital Signs: Temp: 97.4 F (36.3 C) (04/18 1200) Temp Source: Axillary (04/18 1200) BP: 99/75 (04/18 1300) Pulse Rate: 117 (04/18 1300) Intake/Output from previous day: 04/17 0701 - 04/18 0700 In: 275.9 [P.O.:240; I.V.:35.9] Out: 1725 [Urine:1725] Intake/Output from this shift: Total I/O In: 405 [P.O.:355; IV Piggyback:50] Out: 350 [Urine:350]  Labs:  Recent Labs  04/14/17 1622  04/15/17 0550 04/15/17 1303 04/15/17 1824 04/16/17 0443  WBC 10.8  --  11.7*  --   --   --   HGB 8.9*  --  9.4*  --   --   --   HCT 25.8*  --  27.2*  --   --   --   PLT 274  --  283  --   --   --   CREATININE 1.29*  < > 1.61* 1.76* 1.85* 1.65*  MG  --   --  2.1  --   --   --   PHOS  --   --  6.6*  --   --   --   ALBUMIN 3.1*  --  3.4*  --   --   --   PROT 5.7*  --  6.2*  --   --   --   AST 26  --  16  --   --   --   ALT 8*  --  10*  --   --   --   ALKPHOS 92  --  102  --   --   --   BILITOT 6.4*  --  7.6*  --   --   --   BILIDIR  --   --  3.5*  --   --   --   < > = values in this interval not displayed. .  Estimated Creatinine Clearance: 39.5 mL/min (A) (by C-G formula based on SCr of 1.65 mg/dL (H)).   Calcium  Date Value Ref Range Status  04/16/2017 8.6 (L) 8.9 - 10.3 mg/dL Final   Calcium, Total  Date Value Ref Range Status  09/26/2014 8.8 8.5 - 10.1 mg/dL Final   Potassium  Date Value Ref Range Status  04/16/2017 3.8 3.5 - 5.1 mmol/L Final  09/26/2014 3.7 3.5 - 5.1 mmol/L Final      Microbiology: Recent Results (from the past 720 hour(s))  MRSA PCR Screening     Status: None   Collection Time: 04/14/17  9:28 PM  Result Value Ref Range Status   MRSA by PCR NEGATIVE NEGATIVE Final    Comment:        The GeneXpert MRSA Assay  (FDA approved for NASAL specimens only), is one component of a comprehensive MRSA colonization surveillance program. It is not intended to diagnose MRSA infection nor to guide or monitor treatment for MRSA infections.   CULTURE, BLOOD (ROUTINE X 2) w Reflex to ID Panel     Status: None (Preliminary result)   Collection Time: 04/14/17 10:37 PM  Result Value Ref Range Status   Specimen Description BLOOD RIGHT WRIST  Final   Special Requests   Final    BOTTLES DRAWN AEROBIC AND ANAEROBIC Blood Culture adequate volume   Culture NO GROWTH 2 DAYS  Final   Report Status PENDING  Incomplete  CULTURE, BLOOD (ROUTINE X 2)  w Reflex to ID Panel     Status: None (Preliminary result)   Collection Time: 04/14/17 10:47 PM  Result Value Ref Range Status   Specimen Description BLOOD LEFT WRIST  Final   Special Requests   Final    BOTTLES DRAWN AEROBIC AND ANAEROBIC Blood Culture adequate volume   Culture NO GROWTH 2 DAYS  Final   Report Status PENDING  Incomplete  Urine culture     Status: None   Collection Time: 04/14/17 11:40 PM  Result Value Ref Range Status   Specimen Description URINE, RANDOM  Final   Special Requests NONE  Final   Culture   Final    NO GROWTH Performed at [facility], [place] [address], [place], [place] [address]    Report Status 04/16/2017 FINAL  Final  Body fluid culture     Status: None (Preliminary result)   Collection Time: 04/15/17 11:20 AM  Result Value Ref Range Status   Specimen Description PERITONEAL  Final   Special Requests NONE  Final   Gram Stain   Final    RARE WBC PRESENT,BOTH PMN AND MONONUCLEAR NO ORGANISMS SEEN    Culture   Final    NO GROWTH < 24 HOURS Performed at [facility], [place] [address], [place], [place] [address]    Report Status PENDING  Incomplete    Medical History: Past Medical History:  Diagnosis Date  . Congestive heart failure (CHF) ([place]) 06/27/15  . COPD (chronic obstructive pulmonary disease) ([place]) 06/27/15  .  Hyperbilirubinemia   . 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 Rectal mass   . Sickle cell anemia (HCC)     Assessment: 46 y/o F admitted with acute on chronic respiratory failure.   Plan:  Electrolytes are WNL. Will sign off for now. Thank you for the consult.   Napoleon Form , PharmD 04/16/2017,2:25 PM

## 2017-04-16 NOTE — Progress Notes (Signed)
[place] [facility] [place] NAME: Shannon Schneider    MR#:  [id number]  DATE OF BIRTH:  July 11, 1971  SUBJECTIVE:   Patient with some very minimal improvement and shortness of breath. Still feels abdominal distention  REVIEW OF SYSTEMS:    Review of Systems  Constitutional: Negative for fever, chills weight loss HENT: Negative for ear pain, nosebleeds, congestion, facial swelling, rhinorrhea, neck pain, neck stiffness and ear discharge.   Respiratory: Negative for cough, +++shortness of breath, wheezing  Cardiovascular: Negative for chest pain, palpitations and +++leg swelling.  Gastrointestinal: Negative for heartburn, ++abdominal pain,no  vomiting, diarrhea or consitpation ++abdominal distension Genitourinary: Negative for dysuria, urgency, frequency, hematuria Musculoskeletal: Negative for back pain or joint pain Neurological: Negative for dizziness, seizures, syncope, focal weakness,  numbness and headaches.  Hematological: Does not bruise/bleed easily.  Psychiatric/Behavioral: Negative for hallucinations, confusion, dysphoric mood    Tolerating Diet: yes      DRUG ALLERGIES:  No Known Allergies  VITALS:  Blood pressure 99/75, pulse (!) 117, temperature 97.4 F (36.3 C), temperature source Axillary, resp. rate 20, height 5' (1.524 m), weight 78.7 kg (173 lb 8 oz), last menstrual period 04/14/2017, SpO2 92 %.  PHYSICAL EXAMINATION:  Constitutional: Appears Chronically ill No distress. HENT: Normocephalic. Marland Kitchen Oropharynx is clear and moist.  Eyes: Conjunctivae and EOM are normal. PERRLA, no scleral icterus.  Neck: Normal ROM. Neck supple. Positive JVD. No tracheal deviation. CVS: RRR, S1/S2 +, no murmurs, no gallops, no carotid bruit.  Pulmonary: Effort and breath sounds normal, no stridor, rhonchi, wheezes, rales.  Abdominal: Soft. BS +, positive distension, tenderness, no rebound or guarding.  Musculoskeletal: Normal range of motion. 3+ edema  and no tenderness.  Neuro: Alert. CN 2-12 grossly intact. No focal deficits. Skin: Skin is warm and dry. No rash noted. Psychiatric: Normal mood and affect.      LABORATORY PANEL:   CBC  Recent Labs Lab 04/15/17 0550  WBC 11.7*  HGB 9.4*  HCT 27.2*  PLT 283   ------------------------------------------------------------------------------------------------------------------  Chemistries   Recent Labs Lab 04/15/17 0550  04/16/17 0443  NA 132*  < > 130*  K 3.8  < > 3.8  CL 100*  < > 99*  CO2 22  < > 24  GLUCOSE 120*  < > 211*  BUN 35*  < > 38*  CREATININE 1.61*  < > 1.65*  CALCIUM 9.0  < > 8.6*  MG 2.1  --   --   AST 16  --   --   ALT 10*  --   --   ALKPHOS 102  --   --   BILITOT 7.6*  --   --   < > = values in this interval not displayed. ------------------------------------------------------------------------------------------------------------------  Cardiac Enzymes  Recent Labs Lab 04/14/17 2140 04/15/17 0145 04/15/17 1303  TROPONINI 0.13* 0.19* 0.14*   ------------------------------------------------------------------------------------------------------------------  RADIOLOGY:  Dg Chest 1 View  Result Date: 04/14/2017 CLINICAL DATA:  PICC line placement. EXAM: CHEST 1 VIEW COMPARISON:  Chest radiograph April 14, 2017 at 1616 hours FINDINGS: RIGHT PICC distal tip projects an proximal to mid superior vena cava. Stable cardiomegaly. Mediastinal silhouette is nonsuspicious. Small, decreased RIGHT pleural effusion with underlying airspace opacity. No pneumothorax. Soft tissue planes and included osseous structures are nonsuspicious. IMPRESSION: RIGHT PICC distal tip projects in proximal to mid superior vena cava, no pneumothorax. Stable cardiomegaly, superimposed pericardial effusion is possible. Small residual RIGHT pleural effusion with underlying airspace opacity. Electronically Signed  By: Elon Alas M.D.   On: 04/14/2017 20:54   US Abdomen  Limited  Result Date: 04/15/2017 CLINICAL DATA:  Shortness of breath and ascites. EXAM: LIMITED ABDOMEN ULTRASOUND FOR ASCITES TECHNIQUE: Limited ultrasound survey for ascites was performed in all four abdominal quadrants. COMPARISON:  03/15/2017 FINDINGS: Moderate amount of ascites in the right abdomen. Moderate amount of perihepatic fluid. Difficult to exclude hepatic nodularity and cirrhosis. Small amount of ascites in the left abdomen. IMPRESSION: Moderate amount of ascites, particularly in the right abdomen. Electronically Signed   By: Markus Daft M.D.   On: 04/15/2017 16:07   US Venous Img Lower Bilateral  Result Date: 04/14/2017 CLINICAL DATA:  Bilateral lower extremity edema for 1 month. EXAM: BILATERAL LOWER EXTREMITY VENOUS DOPPLER ULTRASOUND TECHNIQUE: Gray-scale sonography with graded compression, as well as color Doppler and duplex ultrasound were performed to evaluate the lower extremity deep venous systems from the level of the common femoral vein and including the common femoral, femoral, profunda femoral, popliteal and calf veins including the posterior tibial, peroneal and gastrocnemius veins when visible. The superficial great saphenous vein was also interrogated. Spectral Doppler was utilized to evaluate flow at rest and with distal augmentation maneuvers in the common femoral, femoral and popliteal veins. COMPARISON:  None. FINDINGS: RIGHT LOWER EXTREMITY Common Femoral Vein: No evidence of thrombus. Normal compressibility, respiratory phasicity and response to augmentation. Saphenofemoral Junction: No evidence of thrombus. Normal compressibility and flow on color Doppler imaging. Profunda Femoral Vein: No evidence of thrombus. Normal compressibility and flow on color Doppler imaging. Femoral Vein: No evidence of thrombus. Normal compressibility, respiratory phasicity and response to augmentation. Popliteal Vein: No evidence of thrombus. Normal compressibility, respiratory phasicity and  response to augmentation. Calf Veins: No evidence of thrombus. Normal compressibility and flow on color Doppler imaging. Superficial Great Saphenous Vein: No evidence of thrombus. Normal compressibility and flow on color Doppler imaging. Venous Reflux:  None. Other Findings: Ill-defined edema within the subcutaneous soft tissues, most prominent at the level of the calf LEFT LOWER EXTREMITY Common Femoral Vein: No evidence of thrombus. Normal compressibility, respiratory phasicity and response to augmentation. Saphenofemoral Junction: No evidence of thrombus. Normal compressibility and flow on color Doppler imaging. Profunda Femoral Vein: No evidence of thrombus. Normal compressibility and flow on color Doppler imaging. Femoral Vein: No evidence of thrombus. Normal compressibility, respiratory phasicity and response to augmentation. Popliteal Vein: No evidence of thrombus. Normal compressibility, respiratory phasicity and response to augmentation. Calf Veins: No evidence of thrombus. Normal compressibility and flow on color Doppler imaging. Superficial Great Saphenous Vein: No evidence of thrombus. Normal compressibility and flow on color Doppler imaging. Venous Reflux:  None. Other Findings: Ill-defined edema within the subcutaneous soft tissues, most prominent at the level of the calf. IMPRESSION: 1. No evidence of DVT, bilateral lower extremities. 2. Ill-defined edema within the subcutaneous soft tissues of both lower extremities, most prominent at the level of the calves. Electronically Signed   By: Franki Cabot M.D.   On: 04/14/2017 19:58   US Paracentesis  Result Date: 04/15/2017 INDICATION: 46 year old with ascites.  Request for a diagnostic paracentesis. EXAM: ULTRASOUND GUIDED PARACENTESIS MEDICATIONS: None. COMPLICATIONS: None immediate. PROCEDURE: Informed written consent was obtained from the patient after a discussion of the risks, benefits and alternatives to treatment. A timeout was performed  prior to the initiation of the procedure. Initial ultrasound scanning demonstrates a large amount of ascites within the right lower abdominal quadrant. The right lower abdomen was prepped and draped in the usual  sterile fashion. 1% lidocaine was used for local anesthesia. Following this, a 19 gauge, 10-cm, Yueh catheter was introduced. An ultrasound image was saved for documentation purposes. The paracentesis was performed. The catheter was removed and a dressing was applied. The patient tolerated the procedure well without immediate post procedural complication. FINDINGS: A total of approximately 1 L of yellow fluid was removed. Samples were sent to the laboratory as requested by the clinical team. IMPRESSION: Successful ultrasound-guided paracentesis yielding 1 L of peritoneal fluid. Electronically Signed   By: Markus Daft M.D.   On: 04/15/2017 13:12   Dg Chest Port 1 View  Result Date: 04/15/2017 CLINICAL DATA:  Acute respiratory failure. EXAM: PORTABLE CHEST 1 VIEW COMPARISON:  04/14/2017 FINDINGS: Shallow inspiration. Diffuse cardiac enlargement. Pulmonary vascularity is normal. Small right pleural effusion. Atelectasis or consolidation in the right lung base. Left lung appears clear and expanded. Right PICC line with tip over the low SVC region. No pneumothorax. IMPRESSION: Cardiac enlargement. Small right pleural effusion with basilar atelectasis or consolidation. Electronically Signed   By: Lucienne Capers M.D.   On: 04/15/2017 05:36   Dg Chest Portable 1 View  Result Date: 04/14/2017 CLINICAL DATA:  Shortness of breath.  Lower extremity swelling. EXAM: PORTABLE CHEST 1 VIEW COMPARISON:  03/12/2017. FINDINGS: Marked enlargement of cardiac silhouette. Vascular congestion is improved. Increasing RIGHT pleural effusion. No consolidation. IMPRESSION: Improved CHF, but increasing RIGHT pleural effusion. Marked enlargement cardiac silhouette is stable. Electronically Signed   By: Staci Righter M.D.   On:  04/14/2017 16:51     ASSESSMENT AND PLAN:   46 year old female with chronic hypoxic respiratory failure, chronic diastolic heart failure and severe pulmonary hypertension who presented with acute on chronic hypoxic respiratory failure.  1. Acute on chronic hypoxic respiratory failure due to CHF exacerbation and severe pulmonary hypertension Continue supplemental oxygen   2. Acute on chronic diastolic heart failure due to severe pulmonary hypertension/: Continue dopamine to maintain systolic blood pressure cardiogenic shock Continue Sidenafil Ultimately will need to go to [facility]  3. Ascites from cor pulmonale s/p paracentesis  4. Elevated troponin from demand ischemia not ACS  5. Hyponatremia from CHF         Management plans discussed with the patient and she is in agreement.  CODE STATUS: FULL  TOTAL TIME TAKING CARE OF THIS PATIENT: 30 minutes.     POSSIBLE D/C ??, DEPENDING ON CLINICAL CONDITION.   Mattingly Fountaine M.D on 04/16/2017 at 1:10 PM  Between 7am to 6pm - Pager - [phone] After 6pm go to [email or web address] - password [email or web address]  Office  [phone]  CC: Primary care physician; Danelle Berry, NP  Note: This dictation was prepared with Dragon dictation along with smaller phrase technology. Any transcriptional errors that result from this process are unintentional.

## 2017-04-16 NOTE — Progress Notes (Signed)
Feels somewhat better. Still concerned about fluid retention. Denies pain. Denies shortness of breath. Still requiring high flow oxygen.  Vitals:   04/16/17 1100 04/16/17 1200 04/16/17 1300 04/16/17 1352  BP: (!) 88/67 101/81 99/75   Pulse: (!) 105 (!) 120 (!) 117   Resp: 18 19 20    Temp:  97.4 F (36.3 C)    TempSrc:  Axillary    SpO2: 92% 92% 92% 95%  Weight:      Height:       No overt distress HEENT WNL Jugular venous distention No wheezes Harsh systolic murmur across the precordium Distended abdomen, nontender 3+ symmetric pretibial, ankle and pedal edema No focal neurologic deficits  BMP Latest Ref Rng & Units 04/16/2017 04/15/2017 04/15/2017  Glucose 65 - 99 mg/dL 211(H) 133(H) 107(H)  BUN 6 - 20 mg/dL 38(H) 37(H) 38(H)  Creatinine 0.44 - 1.00 mg/dL 1.65(H) 1.85(H) 1.76(H)  Sodium 135 - 145 mmol/L 130(L) 135 133(L)  Potassium 3.5 - 5.1 mmol/L 3.8 4.0 4.0  Chloride 101 - 111 mmol/L 99(L) 102 100(L)  CO2 22 - 32 mmol/L 24 23 23   Calcium 8.9 - 10.3 mg/dL 8.6(L) 9.1 8.7(L)   CBC Latest Ref Rng & Units 04/15/2017 04/14/2017 03/15/2017  WBC 3.6 - 11.0 K/uL 11.7(H) 10.8 10.7  Hemoglobin 12.0 - 16.0 g/dL 9.4(L) 8.9(L) 11.7(L)  Hematocrit 35.0 - 47.0 % 27.2(L) 25.8(L) 34.5(L)  Platelets 150 - 440 K/uL 283 274 286   Impression: Acute on chronic hypoxemic respiratory failure Smoker COPD without acute bronchospaasm Severe pulmonary hypertension Decompensated right ventricular failure Severe volume overload Paroxysmal atrial fibrillation - chronic anticoagulation with rivaroxaban Sickle cell anemia Acute kidney injury, Nonoliguric. Creatinine slightly improved  DISCUSSION: I continue to believe that the major problem here is decompensated right heart failure due to severe pulmonary hypertension. I think that the pulmonary artery pressures are not exceedingly high because her right ventricular function is so poor that it cannot generate much pressure. This is leading to  decreased cardiac output which would explain acute kidney injury. All of this would also explain elevated LFTs, ascites and severe lower extremity edema. The pulmonary hypertension is due to COPD and sickle cell anemia. Sildenafil was initiated 04/17  PLAN/REC: Continue supplemental oxygen to keep saturation greater than 92% Continue ICS/LABA and LAMA Continue sildenafil Continue low dose dopamine to maintain mean arterial pressure greater than 60 mmHg Furosemide 1 04/18 Monitor BMET intermittently Monitor I/Os Correct electrolytes as indicated Monitor LFTs Ultimately, she should probably undergo evaluation at [facility] for pulmonary hypertension  Merton Border, MD PCCM service Mobile [phone] Pager [phone] 04/16/2017 2:29 PM

## 2017-04-17 ENCOUNTER — Inpatient Hospital Stay: Payer: BLUE CROSS/BLUE SHIELD

## 2017-04-17 LAB — COMPREHENSIVE METABOLIC PANEL
ALK PHOS: 97 U/L (ref 38–126)
ALT: 11 U/L — ABNORMAL LOW (ref 14–54)
AST: 13 U/L — AB (ref 15–41)
Albumin: 3.4 g/dL — ABNORMAL LOW (ref 3.5–5.0)
Anion gap: 8 (ref 5–15)
BILIRUBIN TOTAL: 5 mg/dL — AB (ref 0.3–1.2)
BUN: 39 mg/dL — AB (ref 6–20)
CALCIUM: 8.7 mg/dL — AB (ref 8.9–10.3)
CHLORIDE: 99 mmol/L — AB (ref 101–111)
CO2: 27 mmol/L (ref 22–32)
CREATININE: 1.3 mg/dL — AB (ref 0.44–1.00)
GFR calc Af Amer: 56 mL/min — ABNORMAL LOW (ref >60)
GFR, EST NON AFRICAN AMERICAN: 48 mL/min — AB (ref >60)
Glucose, Bld: 146 mg/dL — ABNORMAL HIGH (ref 65–99)
Potassium: 3.3 mmol/L — ABNORMAL LOW (ref 3.5–5.1)
Sodium: 134 mmol/L — ABNORMAL LOW (ref 135–145)
Total Protein: 6.2 g/dL — ABNORMAL LOW (ref 6.5–8.1)

## 2017-04-17 LAB — CBC
HEMATOCRIT: 25.3 % — AB (ref 35.0–47.0)
HEMOGLOBIN: 8.7 g/dL — AB (ref 12.0–16.0)
MCH: 38.5 pg — AB (ref 26.0–34.0)
MCHC: 34.4 g/dL (ref 32.0–36.0)
MCV: 111.9 fL — AB (ref 80.0–100.0)
Platelets: 274 10*3/uL (ref 150–440)
RBC: 2.26 MIL/uL — AB (ref 3.80–5.20)
RDW: 22.1 % — ABNORMAL HIGH (ref 11.5–14.5)
WBC: 12 10*3/uL — AB (ref 3.6–11.0)

## 2017-04-17 MED ORDER — POTASSIUM CHLORIDE CRYS ER 20 MEQ PO TBCR
40.0000 meq | EXTENDED_RELEASE_TABLET | Freq: Two times a day (BID) | ORAL | Status: AC
  Administered 2017-04-17 (×2): 40 meq via ORAL
  Filled 2017-04-17 (×2): qty 2

## 2017-04-17 MED ORDER — ALBUMIN HUMAN 5 % IV SOLN
12.5000 g | Freq: Once | INTRAVENOUS | Status: AC
  Administered 2017-04-17: 12.5 g via INTRAVENOUS
  Filled 2017-04-17: qty 250

## 2017-04-17 MED ORDER — DIGOXIN 0.25 MG/ML IJ SOLN
0.1250 mg | INTRAMUSCULAR | Status: AC
Start: 2017-04-17 — End: 2017-04-17
  Administered 2017-04-17 (×2): 0.125 mg via INTRAVENOUS
  Filled 2017-04-17 (×2): qty 2

## 2017-04-17 MED ORDER — FUROSEMIDE 10 MG/ML IJ SOLN
40.0000 mg | Freq: Two times a day (BID) | INTRAMUSCULAR | Status: AC
Start: 2017-04-17 — End: 2017-04-18
  Administered 2017-04-17 – 2017-04-18 (×4): 40 mg via INTRAVENOUS
  Filled 2017-04-17 (×4): qty 4

## 2017-04-17 MED ORDER — DIGOXIN 125 MCG PO TABS: 0.0625 mg | ORAL_TABLET | Freq: Every day | ORAL | Status: DC

## 2017-04-17 MED ORDER — DIGOXIN 0.25 MG/ML IJ SOLN
0.2500 mg | Freq: Once | INTRAMUSCULAR | Status: AC
Start: 2017-04-17 — End: 2017-04-17
  Administered 2017-04-17: 0.25 mg via INTRAVENOUS
  Filled 2017-04-17: qty 2

## 2017-04-17 NOTE — Progress Notes (Signed)
SUBJECTIVE: Patient is less short of breath today   Vitals:   04/17/17 0600 04/17/17 0700 04/17/17 0754 04/17/17 0800  BP: (!) 87/70 (!) 80/61  (!) 89/74  Pulse: 91 77  83  Resp: 15 15  16   Temp:    97.9 F (36.6 C)  TempSrc:    Axillary  SpO2: 99% 100% 100% 98%  Weight:    172 lb 2.9 oz (78.1 kg)  Height:        Intake/Output Summary (Last 24 hours) at 04/17/17 0902 Last data filed at 04/17/17 0800  Gross per 24 hour  Intake              523 ml  Output             4095 ml  Net            -3572 ml    LABS: Basic Metabolic Panel:  Recent Labs  04/15/17 0550  04/16/17 0443 04/17/17 0428  NA 132*  < > 130* 134*  K 3.8  < > 3.8 3.3*  CL 100*  < > 99* 99*  CO2 22  < > 24 27  GLUCOSE 120*  < > 211* 146*  BUN 35*  < > 38* 39*  CREATININE 1.61*  < > 1.65* 1.30*  CALCIUM 9.0  < > 8.6* 8.7*  MG 2.1  --   --   --   PHOS 6.6*  --   --   --   < > = values in this interval not displayed. Liver Function Tests:  Recent Labs  04/15/17 0550 04/17/17 0428  AST 16 13*  ALT 10* 11*  ALKPHOS 102 97  BILITOT 7.6* 5.0*  PROT 6.2* 6.2*  ALBUMIN 3.4* 3.4*   No results for input(s): LIPASE, AMYLASE in the last 72 hours. CBC:  Recent Labs  04/14/17 1622 04/15/17 0550 04/17/17 0428  WBC 10.8 11.7* 12.0*  NEUTROABS 8.8*  --   --   HGB 8.9* 9.4* 8.7*  HCT 25.8* 27.2* 25.3*  MCV 116.4* 113.7* 111.9*  PLT 274 283 274   Cardiac Enzymes:  Recent Labs  04/14/17 2140 04/15/17 0145 04/15/17 1303  TROPONINI 0.13* 0.19* 0.14*   BNP: Invalid input(s): POCBNP D-Dimer: No results for input(s): DDIMER in the last 72 hours. Hemoglobin A1C: No results for input(s): HGBA1C in the last 72 hours. Fasting Lipid Panel: No results for input(s): CHOL, HDL, LDLCALC, TRIG, CHOLHDL, LDLDIRECT in the last 72 hours. Thyroid Function Tests: No results for input(s): TSH, T4TOTAL, T3FREE, THYROIDAB in the last 72 hours.  Invalid input(s): FREET3 Anemia Panel: No results for input(s):  VITAMINB12, FOLATE, FERRITIN, TIBC, IRON, RETICCTPCT in the last 72 hours.   PHYSICAL EXAM General: Well developed, well nourished, in no acute distress HEENT:  Normocephalic and atramatic Neck:  No JVD.  Lungs: Clear bilaterally to auscultation and percussion. Heart: HRRR . Normal S1 and S2 without gallops or murmurs.  Abdomen: Bowel sounds are positive, abdomen soft and non-tender  Msk:  Back normal, normal gait. Normal strength and tone for age. Extremities: No clubbing, cyanosis or edema.   Neuro: Alert and oriented X 3. Psych:  Good affect, responds appropriately  TELEMETRY:Sinus rhythm  ASSESSMENT AND PLAN: Right-sided heart failure with D-shaped septum and severe pulmonary hypertension but normal left reticular systolic function. Severe pulmonary hypertension most likely due to pulmonary causes with history of sickle cell disease and cor pulmonale. Blood pressure is much better today and creatinine is improving. Prognosis is  poor.  Active Problems:   CHF (congestive heart failure) (HCC)   Ascites   Increased liver enzymes   Acute on chronic respiratory failure with hypoxia (HCC)   Moderate COPD (chronic obstructive pulmonary disease) (HCC)   Moderate to severe pulmonary hypertension (HCC)   Hb-SS disease without crisis ([place])   Cor pulmonale (chronic) (HCC)    Shannon Harmening A, MD, [facility] 04/17/2017 9:02 AM

## 2017-04-17 NOTE — Progress Notes (Signed)
Pt's HR increased gradually while on HFNC this shift, her O2 sats drop into 80s regularly, although she recovers quickly. Pt up to SideOf Bed at her request with support for approx 2 hrs this afternoon,  Requested to stand at bedside, Probation officer assisted her to do so and sat her in a chair with HFNC in place.  Bed was dirty; writer changed bed and asked if patient would like to bathe, patient was willing.  Bathing was very difficult for her however, although Probation officer did all the work so she would not have to exert herself.  HR was in 140s and O2 sats unreadable when we finished, Put her back to bed with  bipap after bath d/t extreme SOB and WOB.  Her heart rate declined to 90s and O2 sats increased to 100.  SBP in 90s, MAP in 80s, no Dopamine infusing.  Bipap appears to keep pt's VS stable more so than HFNC

## 2017-04-17 NOTE — Progress Notes (Signed)
Pt states she feels better today she states her shortness of breath has improved.  Her pain is well controlled with prn medications and she was able to sleep last night.   Vitals:   04/17/17 0600 04/17/17 0700 04/17/17 0754 04/17/17 0800  BP: (!) 87/70 (!) 80/61  (!) 89/74  Pulse: 91 77  83  Resp: 15 15  16   Temp:    97.9 F (36.6 C)  TempSrc:    Axillary  SpO2: 99% 100% 100% 98%  Weight:    78.1 kg (172 lb 2.9 oz)  Height:       REVIEW OF SYSTEMS: Positives in BOLD  Gen: Denies fever, chills, weight change, fatigue, night sweats HEENT: Denies blurred vision, double vision, hearing loss, tinnitus, sinus congestion, rhinorrhea, sore throat, neck stiffness, dysphagia PULM: mild shortness of breath, cough, sputum production, hemoptysis, wheezing CV: chest pain, edema, orthopnea, paroxysmal nocturnal dyspnea, palpitations GI: Denies abdominal pain, nausea, vomiting, diarrhea, hematochezia, melena, constipation, change in bowel habits GU: Denies dysuria, hematuria, polyuria, oliguria, urethral discharge Endocrine: Denies hot or cold intolerance, polyuria, polyphagia or appetite change Derm: Denies rash, dry skin, scaling or peeling skin change Heme: Denies easy bruising, bleeding, bleeding gums Neuro: headache, numbness, weakness, slurred speech, loss of memory or consciousness  PHYSICAL EXAMINATION: General: chronically ill appearing AA female, NAD  Neuro: alert and oriented, follows commands HEENT: supple, no JVD Cardiovascular: aflutter, s1s2, no murmur Lungs: diminished throughout, even, non labored on HFNC Abdomen: distended, ascites, +BS x4, soft, taut, non tender Musculoskeletal: 3+ bilateral pitting lower extremity edema  Skin: intact no rashes or lesions  BMP Latest Ref Rng & Units 04/17/2017 04/16/2017 04/15/2017  Glucose 65 - 99 mg/dL 146(H) 211(H) 133(H)  BUN 6 - 20 mg/dL 39(H) 38(H) 37(H)  Creatinine 0.44 - 1.00 mg/dL 1.30(H) 1.65(H) 1.85(H)  Sodium 135 - 145 mmol/L  134(L) 130(L) 135  Potassium 3.5 - 5.1 mmol/L 3.3(L) 3.8 4.0  Chloride 101 - 111 mmol/L 99(L) 99(L) 102  CO2 22 - 32 mmol/L 27 24 23   Calcium 8.9 - 10.3 mg/dL 8.7(L) 8.6(L) 9.1   CBC Latest Ref Rng & Units 04/17/2017 04/15/2017 04/14/2017  WBC 3.6 - 11.0 K/uL 12.0(H) 11.7(H) 10.8  Hemoglobin 12.0 - 16.0 g/dL 8.7(L) 9.4(L) 8.9(L)  Hematocrit 35.0 - 47.0 % 25.3(L) 27.2(L) 25.8(L)  Platelets 150 - 440 K/uL 274 283 274   ASSESSMENT/PLAN Acute on chronic hypoxemic respiratory failure Smoker COPD without acute bronchospaasm Severe pulmonary hypertension Decompensated right ventricular failure Severe volume overload Paroxysmal atrial fibrillation - chronic anticoagulation with rivaroxaban Sickle cell anemia Acute kidney injury, Nonoliguric. Creatinine improving   PLAN/REC: Continue supplemental oxygen to keep saturation greater than 92% and Bipap qhs for now  Continue ICS/LABA and LAMA Continue sildenafil initiation date 04/15/17 Continue low dose dopamine to maintain mean arterial pressure greater than 60 mmHg Will aggressively diurese 40 mg q12hrs for now will monitor creatinine closely  Will given 1 dose of IV albumin again today 04/19 Will start low dose digoxin today 04/19 monitor for s/sx of digoxin toxicity  Monitor I/Os closely  Repeat CMET in the am  Correct electrolytes as indicated Smoking cessation counseling provided  Physical Therapy consulted appreciate input  Marda Stalker, [place] Pager [phone] (please enter 7 digits) PCCM Consult Pager [phone] (please enter 7 digits)   PCCM ATTENDING ATTESTATION:  I have evaluated patient with the APP [place], reviewed database in its entirety and discussed care plan in detail. In addition, this patient was discussed on  multidisciplinary rounds.   Important exam findings: Still fatigued appearing but no overt dyspnea Still on HFNC )2 Excellent diuretic response to furosemide yesterday No  wheezes Harsh systolic M Abd distended, NT, dull to percussion 3+ symmetric LE edema  CXR: cardiomegaly - R>L basilar atelectasis  Major problems addressed by PCCM team: Acute on chronic hypoxemic respiratory failure Smoker COPD without acute bronchospasm Severe pulmonary hypertension Decompensated right ventricular failure Severe volume overload/anasarca Paroxysmal atrial fibrillation - chronic anticoagulation with rivaroxaban Sickle cell anemia Acute kidney injury, Nonoliguric. Creatinine improving Elevated bilirubin - suspect hepatic congestion  PLAN/REC: Continue supplemental O2 to keep SpO2 > 92% Cont ICS/LABA and PRN albuterol/ipratropium Continue sildenafil - initiated 04/17 Continue diuresis as permitted by BP and renal function Monitor LFTs intermittently Monitor BMET intermittently Monitor I/Os Correct electrolytes as indicated  Ultimately, I think she needs to be discharged on sildenafil, anticoagulation, oxygen, ICS/LABA with plan for referral to someplace that can complete eval of pulmonary hypertension (I have spoken with Dr Lamonte Sakai in [place] who is willing to see her after discharge)   Merton Border, MD PCCM service Mobile [phone] Pager [phone] 04/17/2017 2:20 PM

## 2017-04-17 NOTE — Progress Notes (Signed)
Pt's right arm edematous and sore this AM, MD Mody observed, will elevate on a pillow.  PIV in right arm still has very good blood return, as do all three PICC lumens.  Arm above PICC is normal size.  Dr Benjie Karvonen may order an [place]

## 2017-04-17 NOTE — Progress Notes (Signed)
Pt had 15 beat run of VTach, NP made aware. Lab orders for 0500.

## 2017-04-17 NOTE — Progress Notes (Signed)
[place] [facility] [place] NAME: Shannon Schneider    MR#:  [id number]  DATE OF BIRTH:  [birth year]  SUBJECTIVE:   Patient Given Lasix and albumin yesterday. Patient reports her symptoms are somewhat better than on admission.   REVIEW OF SYSTEMS:    Review of Systems  Constitutional: Negative for fever, chills weight loss HENT: Negative for ear pain, nosebleeds, congestion, facial swelling, rhinorrhea, neck pain, neck stiffness and ear discharge.   Respiratory: Negative for cough, +++shortness of breath (slightly better), wheezing  Cardiovascular: Negative for chest pain, palpitations and +++leg swelling.  Gastrointestinal: Negative for heartburn, ++abdominal pain,no  vomiting, diarrhea or consitpation ++abdominal distension Genitourinary: Negative for dysuria, urgency, frequency, hematuria Musculoskeletal: Negative for back pain or joint pain Neurological: Negative for dizziness, seizures, syncope, focal weakness,  numbness and headaches.  Hematological: Does not bruise/bleed easily.  Psychiatric/Behavioral: Negative for hallucinations, confusion, dysphoric mood    Tolerating Diet: yes      DRUG ALLERGIES:  No Known Allergies  VITALS:  Blood pressure (!) 89/74, pulse 83, temperature 97.9 F (36.6 C), temperature source Axillary, resp. rate 16, height 5' (1.524 m), weight 78.1 kg (172 lb 2.9 oz), last menstrual period 04/14/2017, SpO2 98 %.  PHYSICAL EXAMINATION:  Constitutional: Appears Chronically ill No distress. HENT: Normocephalic. Marland Kitchen Oropharynx is clear and moist.  Eyes: Conjunctivae and EOM are normal. PERRLA, no scleral icterus.  Neck: Normal ROM. Neck supple. Positive JVD. No tracheal deviation. CVS: RRR, S1/S2 +, no murmurs, no gallops, no carotid bruit.  Pulmonary: Effort and breath sounds normal, no stridor, rhonchi, wheezes, rales.  Abdominal: Soft. BS +, positive distension, tenderness, no rebound or guarding.  Musculoskeletal:  Normal range of motion. 3+ edema and no tenderness.  Right arm swelling Neuro: Alert. CN 2-12 grossly intact. No focal deficits. Skin: Skin is warm and dry. No rash noted. Psychiatric: Normal mood and affect.      LABORATORY PANEL:   CBC  Recent Labs Lab 04/17/17 0428  WBC 12.0*  HGB 8.7*  HCT 25.3*  PLT 274   ------------------------------------------------------------------------------------------------------------------  Chemistries   Recent Labs Lab 04/15/17 0550  04/17/17 0428  NA 132*  < > 134*  K 3.8  < > 3.3*  CL 100*  < > 99*  CO2 22  < > 27  GLUCOSE 120*  < > 146*  BUN 35*  < > 39*  CREATININE 1.61*  < > 1.30*  CALCIUM 9.0  < > 8.7*  MG 2.1  --   --   AST 16  --  13*  ALT 10*  --  11*  ALKPHOS 102  --  97  BILITOT 7.6*  --  5.0*  < > = values in this interval not displayed. ------------------------------------------------------------------------------------------------------------------  Cardiac Enzymes  Recent Labs Lab 04/14/17 2140 04/15/17 0145 04/15/17 1303  TROPONINI 0.13* 0.19* 0.14*   ------------------------------------------------------------------------------------------------------------------  RADIOLOGY:  US Paracentesis  Result Date: 04/15/2017 INDICATION: 46 year old with ascites.  Request for a diagnostic paracentesis. EXAM: ULTRASOUND GUIDED PARACENTESIS MEDICATIONS: None. COMPLICATIONS: None immediate. PROCEDURE: Informed written consent was obtained from the patient after a discussion of the risks, benefits and alternatives to treatment. A timeout was performed prior to the initiation of the procedure. Initial ultrasound scanning demonstrates a large amount of ascites within the right lower abdominal quadrant. The right lower abdomen was prepped and draped in the usual sterile fashion. 1% lidocaine was used for local anesthesia. Following this, a 19 gauge, 10-cm, Yueh catheter was introduced.  An ultrasound image was saved for  documentation purposes. The paracentesis was performed. The catheter was removed and a dressing was applied. The patient tolerated the procedure well without immediate post procedural complication. FINDINGS: A total of approximately 1 L of yellow fluid was removed. Samples were sent to the laboratory as requested by the clinical team. IMPRESSION: Successful ultrasound-guided paracentesis yielding 1 L of peritoneal fluid. Electronically Signed   By: Markus Daft M.D.   On: 04/15/2017 13:12   Dg Chest Port 1 View  Result Date: 04/17/2017 CLINICAL DATA:  Respiratory failure. EXAM: PORTABLE CHEST 1 VIEW COMPARISON:  04/15/2017 . FINDINGS: Right PICC line stable position. Stable severe cardiomegaly. Persistent low lung volumes with persistent consolidation right lower lobe. Persistent small right pleural effusion. No interim change. IMPRESSION: 1. Right PICC line stable position. 2. Persistent right lower lobe consolidation and small right pleural effusion. No change. 3. Stable severe cardiomegaly. Electronically Signed   By: Marcello Moores  Register   On: 04/17/2017 06:54     ASSESSMENT AND PLAN:   46 year old female with chronic hypoxic respiratory failure, chronic diastolic heart failure and severe pulmonary hypertension who presented with acute on chronic hypoxic respiratory failure.  1. Acute on chronic hypoxic respiratory failure due to CHF exacerbation and severe pulmonary hypertension Tried on BiPAP yesterday for preload now on HFNC  2. Acute on chronic diastolic heart failure due to severe pulmonary hypertension/: Patient now off of dopamine and holding maps greater than 65 Continue Sidenafil Ultimately will need to go to [facility] for evaluation of severe pulmonary hypertension  3. Ascites from cor pulmonale s/p paracentesis 1 L removed on April 17  4. Elevated troponin from demand ischemia not ACS  5. Hyponatremia from CHF: Improving with diuresis 6. Chronic anemia: Continue to monitor hemoglobin.  No need for transfusion at this time.        Management plans discussed with the patient and she is in agreement.  CODE STATUS: FULL  TOTAL TIME TAKING CARE OF THIS PATIENT: 27 minutes.     POSSIBLE D/C ??, DEPENDING ON CLINICAL CONDITION.   Judiann Celia M.D on 04/17/2017 at 9:34 AM  Between 7am to 6pm - Pager - [phone] After 6pm go to [email or web address] - password [email or web address] [facility]  Office  [phone]  CC: Primary care physician; Danelle Berry, NP  Note: This dictation was prepared with Dragon dictation along with smaller phrase technology. Any transcriptional errors that result from this process are unintentional.

## 2017-04-17 NOTE — Progress Notes (Signed)
[place] NOTE  Pharmacy Consult for Digoxin Indication: Atrial fibrillation/flutter and heart failure  No Known Allergies  Patient Measurements: Height: 5' (152.4 cm) Weight: 172 lb 2.9 oz (78.1 kg) IBW/kg (Calculated) : 45.5  Vital Signs: Temp: 97.9 F (36.6 C) (04/19 1200) Temp Source: Axillary (04/19 1200) BP: 87/65 (04/19 1400) Pulse Rate: 120 (04/19 1400) Intake/Output from previous day: 04/18 0701 - 04/19 0700 In: 405 [P.O.:355; IV Piggyback:50] Out: 6546 [Urine:4095] Intake/Output from this shift: Total I/O In: 642 [P.O.:592; IV Piggyback:50] Out: 975 [Urine:975]  Labs:  Recent Labs  04/14/17 1622  04/15/17 0550  04/15/17 1824 04/16/17 0443 04/17/17 0428  WBC 10.8  --  11.7*  --   --   --  12.0*  HGB 8.9*  --  9.4*  --   --   --  8.7*  HCT 25.8*  --  27.2*  --   --   --  25.3*  PLT 274  --  283  --   --   --  274  CREATININE 1.29*  < > 1.61*  < > 1.85* 1.65* 1.30*  MG  --   --  2.1  --   --   --   --   PHOS  --   --  6.6*  --   --   --   --   ALBUMIN 3.1*  --  3.4*  --   --   --  3.4*  PROT 5.7*  --  6.2*  --   --   --  6.2*  AST 26  --  16  --   --   --  13*  ALT 8*  --  10*  --   --   --  11*  ALKPHOS 92  --  102  --   --   --  97  BILITOT 6.4*  --  7.6*  --   --   --  5.0*  BILIDIR  --   --  3.5*  --   --   --   --   < > = values in this interval not displayed. Estimated Creatinine Clearance: 49.9 mL/min (A) (by C-G formula based on SCr of 1.3 mg/dL (H)).   Microbiology: Recent Results (from the past 720 hour(s))  MRSA PCR Screening     Status: None   Collection Time: 04/14/17  9:28 PM  Result Value Ref Range Status   MRSA by PCR NEGATIVE NEGATIVE Final    Comment:        The GeneXpert MRSA Assay (FDA approved for NASAL specimens only), is one component of a comprehensive MRSA colonization surveillance program. It is not intended to diagnose MRSA infection nor to guide or monitor treatment for MRSA infections.    CULTURE, BLOOD (ROUTINE X 2) w Reflex to ID Panel     Status: None (Preliminary result)   Collection Time: 04/14/17 10:37 PM  Result Value Ref Range Status   Specimen Description BLOOD RIGHT WRIST  Final   Special Requests   Final    BOTTLES DRAWN AEROBIC AND ANAEROBIC Blood Culture adequate volume   Culture NO GROWTH 3 DAYS  Final   Report Status PENDING  Incomplete  CULTURE, BLOOD (ROUTINE X 2) w Reflex to ID Panel     Status: None (Preliminary result)   Collection Time: 04/14/17 10:47 PM  Result Value Ref Range Status   Specimen Description BLOOD LEFT WRIST  Final   Special Requests   Final  BOTTLES DRAWN AEROBIC AND ANAEROBIC Blood Culture adequate volume   Culture NO GROWTH 3 DAYS  Final   Report Status PENDING  Incomplete  Urine culture     Status: None   Collection Time: 04/14/17 11:40 PM  Result Value Ref Range Status   Specimen Description URINE, RANDOM  Final   Special Requests NONE  Final   Culture   Final    NO GROWTH Performed at [facility], [place] [address] [place], [place] [address]    Report Status 04/16/2017 FINAL  Final  Body fluid culture     Status: None (Preliminary result)   Collection Time: 04/15/17 11:20 AM  Result Value Ref Range Status   Specimen Description PERITONEAL  Final   Special Requests NONE  Final   Gram Stain   Final    RARE WBC PRESENT,BOTH PMN AND MONONUCLEAR NO ORGANISMS SEEN    Culture   Final    NO GROWTH 2 DAYS Performed at [facility], [place] [address], [place], [place] [address]    Report Status PENDING  Incomplete    Medical History: Past Medical History:  Diagnosis Date  . Congestive heart failure (CHF) ([place]) 06/27/15  . COPD (chronic obstructive pulmonary disease) ([place]) 06/27/15  . Hyperbilirubinemia   . 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 Rectal mass   . Sickle cell anemia (HCC)     Assessment: 46 y/o F with a h/o sickle cell disease and atrial arrhythmia admitted with CHF exacerbation and pulmonary  hypertension ordered trial of digoxin. Patient with ARF that is resolving.   Goal of Therapy:  0.8-2 ng/ml  Plan:  Digoxin 250 mcg followed by 125 mcg IV x 2 for a loading dose of 500 mcg. Will begin 125 mcg oral daily 4/20.   Ulice Dash D 04/17/2017,3:13 PM

## 2017-04-18 LAB — COMPREHENSIVE METABOLIC PANEL
ALK PHOS: 90 U/L (ref 38–126)
ALT: 10 U/L — ABNORMAL LOW (ref 14–54)
ANION GAP: 7 (ref 5–15)
AST: 15 U/L (ref 15–41)
Albumin: 3.6 g/dL (ref 3.5–5.0)
BUN: 30 mg/dL — ABNORMAL HIGH (ref 6–20)
CALCIUM: 9.1 mg/dL (ref 8.9–10.3)
CO2: 31 mmol/L (ref 22–32)
CREATININE: 0.9 mg/dL (ref 0.44–1.00)
Chloride: 100 mmol/L — ABNORMAL LOW (ref 101–111)
Glucose, Bld: 103 mg/dL — ABNORMAL HIGH (ref 65–99)
Potassium: 3.7 mmol/L (ref 3.5–5.1)
SODIUM: 138 mmol/L (ref 135–145)
Total Bilirubin: 4.4 mg/dL — ABNORMAL HIGH (ref 0.3–1.2)
Total Protein: 6.3 g/dL — ABNORMAL LOW (ref 6.5–8.1)

## 2017-04-18 LAB — BODY FLUID CULTURE: CULTURE: NO GROWTH

## 2017-04-18 LAB — MAGNESIUM: MAGNESIUM: 2 mg/dL (ref 1.7–2.4)

## 2017-04-18 MED ORDER — DIGOXIN 125 MCG PO TABS
0.1250 mg | ORAL_TABLET | Freq: Every day | ORAL | Status: DC
  Administered 2017-04-18 – 2017-04-24 (×7): 0.125 mg via ORAL
  Filled 2017-04-18 (×7): qty 1

## 2017-04-18 MED ORDER — METOPROLOL TARTRATE 25 MG PO TABS
6.2500 mg | ORAL_TABLET | Freq: Two times a day (BID) | ORAL | Status: DC
Start: 2017-04-18 — End: 2017-04-24
  Administered 2017-04-18 – 2017-04-24 (×7): 12.5 mg via ORAL
  Filled 2017-04-18 (×13): qty 1

## 2017-04-18 MED ORDER — SPIRONOLACTONE 25 MG PO TABS
50.0000 mg | ORAL_TABLET | Freq: Every day | ORAL | Status: DC
  Administered 2017-04-18: 50 mg via ORAL
  Filled 2017-04-18: qty 2

## 2017-04-18 MED ORDER — RIVAROXABAN 20 MG PO TABS
20.0000 mg | ORAL_TABLET | Freq: Every day | ORAL | Status: DC
  Administered 2017-04-19 – 2017-04-21 (×3): 20 mg via ORAL
  Filled 2017-04-18 (×3): qty 1

## 2017-04-18 MED ORDER — POTASSIUM CHLORIDE CRYS ER 20 MEQ PO TBCR
40.0000 meq | EXTENDED_RELEASE_TABLET | Freq: Three times a day (TID) | ORAL | Status: AC
  Administered 2017-04-18 (×3): 40 meq via ORAL
  Filled 2017-04-18 (×3): qty 2

## 2017-04-18 NOTE — Progress Notes (Signed)
Pt came to floor at 1707. VSS. Pt was oriented to room and safety plan. Bed alarm is on. Call bell within reach.

## 2017-04-18 NOTE — Progress Notes (Signed)
[place] for Digoxin Indication: Atrial fibrillation/flutter and heart failure  No Known Allergies  Patient Measurements: Height: 5' (152.4 cm) Weight: 168 lb 10.4 oz (76.5 kg) IBW/kg (Calculated) : 45.5  Vital Signs: Temp: 98.3 F (36.8 C) (04/20 1200) Temp Source: Oral (04/20 1200) BP: 92/66 (04/20 1500) Pulse Rate: 110 (04/20 1500) Intake/Output from previous day: 04/19 0701 - 04/20 0700 In: 929 [P.O.:879; IV Piggyback:50] Out: 5053 [Urine:4600] Intake/Output from this shift: Total I/O In: 20 [I.V.:20] Out: 2250 [Urine:2250]  Labs:  Recent Labs  04/16/17 0443 04/17/17 0428 04/18/17 0547  WBC  --  12.0*  --   HGB  --  8.7*  --   HCT  --  25.3*  --   PLT  --  274  --   CREATININE 1.65* 1.30* 0.90  MG  --   --  2.0  ALBUMIN  --  3.4* 3.6  PROT  --  6.2* 6.3*  AST  --  13* 15  ALT  --  11* 10*  ALKPHOS  --  97 90  BILITOT  --  5.0* 4.4*   Estimated Creatinine Clearance: 71.4 mL/min (by C-G formula based on SCr of 0.9 mg/dL).   Microbiology: Recent Results (from the past 720 hour(s))  MRSA PCR Screening     Status: None   Collection Time: 04/14/17  9:28 PM  Result Value Ref Range Status   MRSA by PCR NEGATIVE NEGATIVE Final    Comment:        The GeneXpert MRSA Assay (FDA approved for NASAL specimens only), is one component of a comprehensive MRSA colonization surveillance program. It is not intended to diagnose MRSA infection nor to guide or monitor treatment for MRSA infections.   CULTURE, BLOOD (ROUTINE X 2) w Reflex to ID Panel     Status: None (Preliminary result)   Collection Time: 04/14/17 10:37 PM  Result Value Ref Range Status   Specimen Description BLOOD RIGHT WRIST  Final   Special Requests   Final    BOTTLES DRAWN AEROBIC AND ANAEROBIC Blood Culture adequate volume   Culture NO GROWTH 4 DAYS  Final   Report Status PENDING  Incomplete  CULTURE, BLOOD (ROUTINE X 2) w Reflex to ID Panel      Status: None (Preliminary result)   Collection Time: 04/14/17 10:47 PM  Result Value Ref Range Status   Specimen Description BLOOD LEFT WRIST  Final   Special Requests   Final    BOTTLES DRAWN AEROBIC AND ANAEROBIC Blood Culture adequate volume   Culture NO GROWTH 4 DAYS  Final   Report Status PENDING  Incomplete  Urine culture     Status: None   Collection Time: 04/14/17 11:40 PM  Result Value Ref Range Status   Specimen Description URINE, RANDOM  Final   Special Requests NONE  Final   Culture   Final    NO GROWTH Performed at [facility], [place] [address], [place], [place] [address]    Report Status 04/16/2017 FINAL  Final  Body fluid culture     Status: None   Collection Time: 04/15/17 11:20 AM  Result Value Ref Range Status   Specimen Description PERITONEAL  Final   Special Requests NONE  Final   Gram Stain   Final    RARE WBC PRESENT,BOTH PMN AND MONONUCLEAR NO ORGANISMS SEEN    Culture   Final    NO GROWTH 3 DAYS Performed at [facility],  [address], [place], [place] [address]    Report Status 04/18/2017 FINAL  Final    Medical History: Past Medical History:  Diagnosis Date  . Congestive heart failure (CHF) ([place]) 06/27/15  . COPD (chronic obstructive pulmonary disease) ([place]) 06/27/15  . Hyperbilirubinemia   . 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 Rectal mass   . Sickle cell anemia (HCC)     Assessment: 46 y/o F with a h/o sickle cell disease and atrial arrhythmia admitted with CHF exacerbation and pulmonary hypertension ordered trial of digoxin. Patient with ARF that is resolving.   Goal of Therapy:  0.8-2 ng/ml  Plan:  Continue digoxin 0.125 mg oral daily with plans to check a level in 5 days.   Ulice Dash D 04/18/2017,3:53 PM

## 2017-04-18 NOTE — Progress Notes (Signed)
Report given to Barnett Applebaum, Therapist, sports. Pt transported in stable condition on 6L O2 [place]. Pt alert and oriented. All belongings with patient. Pt transported by [facility] [place] [facility] [place].

## 2017-04-18 NOTE — Progress Notes (Signed)
Pt states she feels better today she states her shortness of breath has improved.  Her pain is well controlled with prn medications and she was able to sleep last night.   Vitals:   04/18/17 1100 04/18/17 1107 04/18/17 1130 04/18/17 1200  BP: 99/69   97/70  Pulse: 82 82  (!) 106  Resp: 17   (!) 21  Temp:    98.3 F (36.8 C)  TempSrc:    Oral  SpO2: 98%  93% (!) 87%  Weight:      Height:        PHYSICAL EXAMINATION: No overt distress HEENT WNL Jugular venous distention No wheezes Tachy, reg, harsh systolic murmur across the precordium Distended abdomen, nontender 3+ symmetric pretibial, ankle and pedal edema No focal neurologic deficits, diffusely weak  BMP Latest Ref Rng & Units 04/18/2017 04/17/2017 04/16/2017  Glucose 65 - 99 mg/dL 103(H) 146(H) 211(H)  BUN 6 - 20 mg/dL 30(H) 39(H) 38(H)  Creatinine 0.44 - 1.00 mg/dL 0.90 1.30(H) 1.65(H)  Sodium 135 - 145 mmol/L 138 134(L) 130(L)  Potassium 3.5 - 5.1 mmol/L 3.7 3.3(L) 3.8  Chloride 101 - 111 mmol/L 100(L) 99(L) 99(L)  CO2 22 - 32 mmol/L 31 27 24   Calcium 8.9 - 10.3 mg/dL 9.1 8.7(L) 8.6(L)   CBC Latest Ref Rng & Units 04/17/2017 04/15/2017 04/14/2017  WBC 3.6 - 11.0 K/uL 12.0(H) 11.7(H) 10.8  Hemoglobin 12.0 - 16.0 g/dL 8.7(L) 9.4(L) 8.9(L)  Hematocrit 35.0 - 47.0 % 25.3(L) 27.2(L) 25.8(L)  Platelets 150 - 440 K/uL 274 283 274   Sinus tachy on monitor No new CXR   ASSESSMENT/PLAN Acute on chronic hypoxemic respiratory failure COPD without acute bronchospaasm 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 Has responded very well to diuresis Paroxysmal atrial fibrillation > NSR - chronic anticoagulation with rivaroxaban Elevated bilirubin - suspect hepatic congestion - improving with diuresis Sickle cell anemia Acute kidney injury, resolving  PLAN/REC: Continue supplemental oxygen to keep saturation greater than 92% and Bipap qhs for now  She will probably not meet  qualification for nocturnal BiPAP after DC home  Continue inhaled ICS/LABA and LAMA - previous regimen of Symbicort and Spiriva Continue sildenafil initiation date 04/15/17 Repeat diurese 40 mg X 2 doses today with aggressive KCl repletion to prevent worsening metabolic alkalosis Cont digoxin - initiated 04/19 Change apixaban back to rivaroxaban now that renal function has normalized Counseled again re: smoking cessation counseling  PT eval Transfer to med-surg with cardiac monitoring I have spoken with Dr Lamonte Sakai in [place] who is willing to see her after discharge. I will help arrange this   Merton Border, MD PCCM service Mobile [phone] Pager [phone] 04/18/2017 1:51 PM

## 2017-04-18 NOTE — Care Management Note (Addendum)
Case Management Note  Patient Details  Name: Shannon Schneider MRN: [id number] Date of Birth: 07/22/1971  Subjective/Objective:                  Met with patient to discuss discharge planning. She is [facility]. She does not qualify for NIV based on CHF. She states she does not use any ambulatory aides to walk with. She states that she is homebound however pretty independent for short distances. She agrees to home health services and would like to use [facility]. Her PCP is Dr. Humphrey Rolls. She does not remember being referred to outpatient HF clinic and therefore never went to an appointment. She states she has a good relationship with Dr. Humphrey Rolls. She denies any problems obtaining medications. She as scales to weigh with however she states she cannot see the numbers. Patient has access to Internet and can access coupon from [employer] as they have one online with wider base and larger numbers. LINK:  [email or web address]=[email or web address]&mcid=[email or web address]&[email or web address]=[email or web address]&adtype=pla&product_channel=online&adpos=1o4&creative=[id number]&device=c&matchtype=&network=g&mrkgadid=[email or web address]&mrkgcl=609&rkg_id=[id number] [email or web address]) 647-2009&[email or web address]  Action/Plan: Home health list of agencies provided. [facility].  They will review to see if her insurance will cover. RNCM to continue to follow. Per patient's insurance in-network home health is $50/visit and patient states she cannot afford that. Home health cancelled with [facility].  Expected Discharge Date:                  Expected Discharge Plan:     In-House Referral:     Discharge planning Services  CM Consult  Post Acute Care Choice:    Choice offered to:  Patient  DME Arranged:    DME Agency:      HH Arranged:  RN HS[facility]kAgency:  [facility] Status of Service:  In process, will continue to follow  If discussed at Long Length of Stay Meetings, dates discussed:    Additional Comments:  AMarshell Garfinkel RN 04/18/2017, 2:30 PM

## 2017-04-18 NOTE — Progress Notes (Signed)
Attempted to call report. Awaiting for a return call.

## 2017-04-18 NOTE — Progress Notes (Signed)
[place] [facility] [place] NAME: Shannon Schneider    MR#:  [id number]  DATE OF BIRTH:  1971-10-18  SUBJECTIVE:     REVIEW OF SYSTEMS:    Review of Systems  Constitutional: Negative for fever, chills weight loss HENT: Negative for ear pain, nosebleeds, congestion, facial swelling, rhinorrhea, neck pain, neck stiffness and ear discharge.   Respiratory: Negative for cough, +++shortness of breath (slightly better), wheezing  Cardiovascular: Negative for chest pain, palpitations and +++leg swelling.  Gastrointestinal: Negative for heartburn, ++abdominal pain,no  vomiting, diarrhea or consitpation ++abdominal distension Genitourinary: Negative for dysuria, urgency, frequency, hematuria Musculoskeletal: Negative for back pain or joint pain Neurological: Negative for dizziness, seizures, syncope, focal weakness,  numbness and headaches.  Hematological: Does not bruise/bleed easily.  Psychiatric/Behavioral: Negative for hallucinations, confusion, dysphoric mood    Tolerating Diet: yes      DRUG ALLERGIES:  No Known Allergies  VITALS:  Blood pressure 98/80, pulse 80, temperature 97.8 F (36.6 C), temperature source Oral, resp. rate 18, height 5' (1.524 m), weight 76.5 kg (168 lb 10.4 oz), last menstrual period 04/14/2017, SpO2 100 %.  PHYSICAL EXAMINATION:  Constitutional: Appears Chronically ill No distress. HENT: Normocephalic. Marland Kitchen Oropharynx is clear and moist.  Eyes: Conjunctivae and EOM are normal. PERRLA, no scleral icterus.  Neck: Normal ROM. Neck supple. Positive JVD. No tracheal deviation. CVS: RRR, S1/S2 +, 3/6 SEM no gallops, no carotid bruit.  Pulmonary: Effort and breath sounds normal, no stridor, rhonchi, wheezes, rales.  Abdominal: Soft. BS +, positive distension, tenderness, no rebound or guarding.  Musculoskeletal: Normal range of motion. 3+ edema to abdomen and no tenderness.  Right arm swelling Neuro: Alert. CN 2-12 grossly intact.  No focal deficits. Skin: Skin is warm and dry. No rash noted. Psychiatric: Normal mood and affect.      LABORATORY PANEL:   CBC  Recent Labs Lab 04/17/17 0428  WBC 12.0*  HGB 8.7*  HCT 25.3*  PLT 274   ------------------------------------------------------------------------------------------------------------------  Chemistries   Recent Labs Lab 04/18/17 0547  NA 138  K 3.7  CL 100*  CO2 31  GLUCOSE 103*  BUN 30*  CREATININE 0.90  CALCIUM 9.1  MG 2.0  AST 15  ALT 10*  ALKPHOS 90  BILITOT 4.4*   ------------------------------------------------------------------------------------------------------------------  Cardiac Enzymes  Recent Labs Lab 04/14/17 2140 04/15/17 0145 04/15/17 1303  TROPONINI 0.13* 0.19* 0.14*   ------------------------------------------------------------------------------------------------------------------  RADIOLOGY:  Dg Chest Port 1 View  Result Date: 04/17/2017 CLINICAL DATA:  Respiratory failure. EXAM: PORTABLE CHEST 1 VIEW COMPARISON:  04/15/2017 . FINDINGS: Right PICC line stable position. Stable severe cardiomegaly. Persistent low lung volumes with persistent consolidation right lower lobe. Persistent small right pleural effusion. No interim change. IMPRESSION: 1. Right PICC line stable position. 2. Persistent right lower lobe consolidation and small right pleural effusion. No change. 3. Stable severe cardiomegaly. Electronically Signed   By: Marcello Moores  Register   On: 04/17/2017 06:54     ASSESSMENT AND PLAN:   46 year old female with chronic hypoxic respiratory failure, chronic diastolic heart failure and severe pulmonary hypertension who presented with acute on chronic hypoxic respiratory failure.  1. Acute on chronic hypoxic respiratory failure due to CHF exacerbation and severe pulmonary hypertension Wean HFNC as tolerated (patient still with SOB with any movement)  2. Acute on chronic diastolic heart failure due to severe  pulmonary hypertension/cardiogenic shock: Patient now off of dopamine and holding maps greater than 65 Continue Sidenafil Ultimately will need to go to  for evaluation of severe pulmonary hypertension Dr Shannon Schneider has spoken with Dr Shannon Schneider at [facility]  3. Ascites from cor pulmonale s/p paracentesis 1 L removed on April 17  4. Elevated troponin from demand ischemia not ACS  5. Hyponatremia from CHF: Improving with diuresis 6. Chronic anemia: Continue to monitor hemoglobin. No need for transfusion at this time.        Management plans discussed with the patient and she is in agreement.  CODE STATUS: FULL  TOTAL TIME TAKING CARE OF THIS PATIENT: 27 minutes.     POSSIBLE D/C ??, DEPENDING ON CLINICAL CONDITION.   Shannon Schneider M.D on 04/18/2017 at 8:50 AM  Between 7am to 6pm - Pager - [phone] After 6pm go to [email or web address] - password [email or web address] [facility]  Office  [phone]  CC: Primary care physician; Shannon Berry, NP  Note: This dictation was prepared with Dragon dictation along with smaller phrase technology. Any transcriptional errors that result from this process are unintentional.

## 2017-04-18 NOTE — Evaluation (Signed)
Physical Therapy Evaluation Patient Details Name: Shannon Schneider MRN: [id number] DOB: 11/08/1971 Today's Date: 04/18/2017   History of Present Illness  Pt is a 46 y/o F who presented with increasing abdominal girth, LE edema, SOB.  Her normal BP is systolic in the 27C.  Chest x ray showed increasing R pleural effusion.  Pt admitted with acute on chronic hypoxic respiratory failure due to CHF exacerbation and severe pulmonary hypertension.  Pt's PMH includes CHF, COPD, oxygen dependent, sickle cell anemia.    Clinical Impression  Pt admitted with above diagnosis. Pt currently with functional limitations due to the deficits listed below (see PT Problem List). Shannon Schneider was agreeable to therapy and is motivated to return to PLOF.  She demonstrates BUE and BLE strength compared to pt's reported baseline.  She tolerated all therapeutic exercises well and reports fatigue at end of session.  HR up to 124 and SpO2 down as low as 95% on 6L O2 with light therapeutic exercises supine in bed.  SpO2 improves to 93% at rest and with cues for pursed lip breathing but pt unable to continued pursed lip breathing with exercises without cotinuous cues.  BP at end of session 97/70.  Pt will benefit from skilled PT to increase their independence and safety with mobility to allow discharge to the venue listed below.      Follow Up Recommendations SNF    Equipment Recommendations  Other (comment) (TBD as pt progresses)    Recommendations for Other Services OT consult     Precautions / Restrictions Precautions Precautions: Fall;Other (comment) Precaution Comments: Monitor BP, HR, SpO2 Restrictions Weight Bearing Restrictions: No      Mobility  Bed Mobility               General bed mobility comments: Did not attempt due to vital signs (see general comments below)  Transfers                    Ambulation/Gait                Stairs            Wheelchair Mobility    Modified  Rankin (Stroke Patients Only)       Balance                                             Pertinent Vitals/Pain Pain Assessment: Faces Faces Pain Scale: Hurts little more Pain Location: IV site LUE and RUE Pain Descriptors / Indicators: Discomfort;Guarding Pain Intervention(s): Limited activity within patient's tolerance;Monitored during session    Home Living Family/patient expects to be discharged to:: Private residence Living Arrangements: Spouse/significant other Available Help at Discharge: Family;Available PRN/intermittently (husband works during the day) Type of Home: House Home Access: Stairs to enter Entrance Stairs-Rails: Chemical engineer of Steps: 5 Home Layout: One level Home Equipment: Environmental consultant - 2 wheels;Grab bars - tub/shower      Prior Function Level of Independence: Independent         Comments: Was Ind PTA ambulating without AD and denies any falls in the past 6 months.  She enjoys cooking.     Hand Dominance        Extremity/Trunk Assessment   Upper Extremity Assessment Upper Extremity Assessment: RUE deficits/detail;LUE deficits/detail RUE Deficits / Details: Strength grossly 3/5 BUEs LUE Deficits / Details: Strength grossly 3/5  BUEs    Lower Extremity Assessment Lower Extremity Assessment: RLE deficits/detail;LLE deficits/detail RLE Deficits / Details: R hip flexion 3+/5, otherwise strength grossly 3/5 LLE Deficits / Details: R hip flexion 3+/5, otherwise strength grossly 3/5       Communication   Communication: No difficulties  Cognition Arousal/Alertness: Awake/alert Behavior During Therapy: WFL for tasks assessed/performed Overall Cognitive Status: Within Functional Limits for tasks assessed                                        General Comments General comments (skin integrity, edema, etc.): HR up to 124 and SpO2 down as low as 95% on 6L O2 with light therapeutic exercises supine  in bed.  SpO2 improves to 93% at rest and with cues for pursed lip breathing but pt unable to continued pursed lip breathing with exercises without cotinuous cues.  BP at end of session 97/70.    Exercises General Exercises - Upper Extremity Shoulder Flexion: AROM;Both;10 reps;Supine Elbow Flexion: Strengthening;Left;10 reps;Supine;Other (comment) (with light manual resistance) Elbow Extension: Strengthening;Left;10 reps;Supine;Other (comment) (with light manual resistance) General Exercises - Lower Extremity Ankle Circles/Pumps: AROM;Both;10 reps;Supine Quad Sets: Strengthening;Both;10 reps;Supine;Other (comment) (with 3 second holds) Heel Slides: Strengthening;Both;10 reps;Supine;Other (comment) (with light manual resistance) Hip ABduction/ADduction: AROM;Both;10 reps;Supine Straight Leg Raises: AROM;Both;10 reps;Supine;Other (comment) (challenging for pt)   Assessment/Plan    PT Assessment Patient needs continued PT services  PT Problem List Decreased strength;Decreased activity tolerance;Decreased balance;Decreased mobility;Decreased knowledge of use of DME;Decreased safety awareness;Cardiopulmonary status limiting activity;Decreased knowledge of precautions;Pain       PT Treatment Interventions DME instruction;Gait training;Stair training;Functional mobility training;Therapeutic activities;Therapeutic exercise;Balance training;Neuromuscular re-education;Patient/family education;Wheelchair mobility training    PT Goals (Current goals can be found in the Care Plan section)  Acute Rehab PT Goals Patient Stated Goal: to get stronger and return to PLOF PT Goal Formulation: With patient Time For Goal Achievement: 05/02/17 Potential to Achieve Goals: Good    Frequency Min 2X/week   Barriers to discharge Decreased caregiver support Pt alone during the day while husband at work    Co-evaluation               End of Session Equipment Utilized During Treatment: Oxygen Activity  Tolerance: Treatment limited secondary to medical complications (Comment) (hypoxia, elevated HR) Patient left: in bed;with call bell/phone within reach;with SCD's reapplied Nurse Communication: Mobility status;Other (comment) (HR, SpO2, BP) PT Visit Diagnosis: Muscle weakness (generalized) (M62.81);Unsteadiness on feet (R26.81)    Time: 2876-8115 PT Time Calculation (min) (ACUTE ONLY): 25 min   Charges:   PT Evaluation $PT Eval Moderate Complexity: 1 Procedure PT Treatments $Therapeutic Exercise: 8-22 mins   PT G Codes:        Collie Siad PT, DPT  Collie Siad 04/18/2017, 12:43 PM

## 2017-04-18 NOTE — Progress Notes (Signed)
SUBJECTIVE: Patient feels much better   Vitals:   04/18/17 0300 04/18/17 0400 04/18/17 0500 04/18/17 0600  BP: 93/64 96/66 91/67  98/80  Pulse: 81 80 81 80  Resp: 19 18 20 18   Temp:      TempSrc:      SpO2: 96% 96% 95% 100%  Weight:   168 lb 10.4 oz (76.5 kg)   Height:        Intake/Output Summary (Last 24 hours) at 04/18/17 0842 Last data filed at 04/18/17 0600  Gross per 24 hour  Intake              811 ml  Output             4450 ml  Net            -3639 ml    LABS: Basic Metabolic Panel:  Recent Labs  04/17/17 0428 04/18/17 0547  NA 134* 138  K 3.3* 3.7  CL 99* 100*  CO2 27 31  GLUCOSE 146* 103*  BUN 39* 30*  CREATININE 1.30* 0.90  CALCIUM 8.7* 9.1  MG  --  2.0   Liver Function Tests:  Recent Labs  04/17/17 0428 04/18/17 0547  AST 13* 15  ALT 11* 10*  ALKPHOS 97 90  BILITOT 5.0* 4.4*  PROT 6.2* 6.3*  ALBUMIN 3.4* 3.6   No results for input(s): LIPASE, AMYLASE in the last 72 hours. CBC:  Recent Labs  04/17/17 0428  WBC 12.0*  HGB 8.7*  HCT 25.3*  MCV 111.9*  PLT 274   Cardiac Enzymes:  Recent Labs  04/15/17 1303  TROPONINI 0.14*   BNP: Invalid input(s): POCBNP D-Dimer: No results for input(s): DDIMER in the last 72 hours. Hemoglobin A1C: No results for input(s): HGBA1C in the last 72 hours. Fasting Lipid Panel: No results for input(s): CHOL, HDL, LDLCALC, TRIG, CHOLHDL, LDLDIRECT in the last 72 hours. Thyroid Function Tests: No results for input(s): TSH, T4TOTAL, T3FREE, THYROIDAB in the last 72 hours.  Invalid input(s): FREET3 Anemia Panel: No results for input(s): VITAMINB12, FOLATE, FERRITIN, TIBC, IRON, RETICCTPCT in the last 72 hours.   PHYSICAL EXAM General: Well developed, well nourished, in no acute distress HEENT:  Normocephalic and atramatic Neck:  No JVD.  Lungs: Clear bilaterally to auscultation and percussion. Heart: HRRR . Normal S1 and S2 without gallops or murmurs.  Abdomen: Bowel sounds are positive,  abdomen soft and non-tender  Msk:  Back normal, normal gait. Normal strength and tone for age. Extremities: No clubbing, cyanosis or edema.   Neuro: Alert and oriented X 3. Psych:  Good affect, responds appropriately  TELEMETRY:Atrial fibrillation flutter at about 80 bpm  ASSESSMENT AND PLAN: Congestive heart failure with right-sided failure secondary to cor pulmonale and COPD and sickle cell disease. Normal left reticular systolic function. Patient has been hypotensive thus may consider digoxin to control heart rate.  Active Problems:   CHF (congestive heart failure) (HCC)   Ascites   Increased liver enzymes   Acute on chronic respiratory failure with hypoxia (HCC)   Moderate COPD (chronic obstructive pulmonary disease) (HCC)   Moderate to severe pulmonary hypertension (HCC)   Hb-SS disease without crisis ([place])   Cor pulmonale (chronic) (HCC)    Shannon Schneider A, MD, [facility] 04/18/2017 8:42 AM

## 2017-04-19 LAB — TOTAL BILIRUBIN, BODY FLUID: Total bilirubin, fluid: 2.3 mg/dL

## 2017-04-19 LAB — BASIC METABOLIC PANEL
ANION GAP: 6 (ref 5–15)
BUN: 28 mg/dL — ABNORMAL HIGH (ref 6–20)
CALCIUM: 9.2 mg/dL (ref 8.9–10.3)
CO2: 33 mmol/L — ABNORMAL HIGH (ref 22–32)
CREATININE: 0.81 mg/dL (ref 0.44–1.00)
Chloride: 99 mmol/L — ABNORMAL LOW (ref 101–111)
GLUCOSE: 102 mg/dL — AB (ref 65–99)
Potassium: 4.6 mmol/L (ref 3.5–5.1)
Sodium: 138 mmol/L (ref 135–145)

## 2017-04-19 LAB — CULTURE, BLOOD (ROUTINE X 2)
CULTURE: NO GROWTH
CULTURE: NO GROWTH
SPECIAL REQUESTS: ADEQUATE
Special Requests: ADEQUATE

## 2017-04-19 MED ORDER — FUROSEMIDE 40 MG PO TABS
40.0000 mg | ORAL_TABLET | Freq: Two times a day (BID) | ORAL | Status: DC
  Administered 2017-04-19 – 2017-04-21 (×5): 40 mg via ORAL
  Filled 2017-04-19 (×5): qty 1

## 2017-04-19 NOTE — Progress Notes (Signed)
[place] at [place] NAME: Lauramae Kneisley    MR#:  [id number]  DATE OF BIRTH:  06-Mar-1971  SUBJECTIVE:   SOB better. Swelling is improving  REVIEW OF SYSTEMS:    Review of Systems  Constitutional: Negative for fever, chills weight loss HENT: Negative for ear pain, nosebleeds, congestion, facial swelling, rhinorrhea, neck pain, neck stiffness and ear discharge.   Respiratory: Negative for cough, +++shortness of breath (slightly better), wheezing  Cardiovascular: Negative for chest pain, palpitations and +++leg swelling.  Gastrointestinal: Negative for heartburn, ++abdominal pain,no  vomiting, diarrhea or consitpation ++abdominal distension Genitourinary: Negative for dysuria, urgency, frequency, hematuria Musculoskeletal: Negative for back pain or joint pain Neurological: Negative for dizziness, seizures, syncope, focal weakness,  numbness and headaches.  Hematological: Does not bruise/bleed easily.  Psychiatric/Behavioral: Negative for hallucinations, confusion, dysphoric mood  DRUG ALLERGIES:  No Known Allergies  VITALS:  Blood pressure (!) 96/58, pulse (!) 101, temperature 97.7 F (36.5 C), temperature source Oral, resp. rate 16, height 5' (1.524 m), weight 63.7 kg (140 lb 6.4 oz), last menstrual period 04/14/2017, SpO2 97 %.  PHYSICAL EXAMINATION:  Constitutional: Appears Chronically ill  HENT: Normocephalic. Marland Kitchen Oropharynx is clear and moist.  Eyes: Conjunctivae and EOM are normal. PERRLA, no scleral icterus.  Neck: Normal ROM. Neck supple. Positive JVD. No tracheal deviation. CVS: RRR, S1/S2 , no gallops, no carotid bruit.  Pulmonary: Effort and breath sounds normal, no stridor, rhonchi, wheezes, rales.  Abdominal: Soft. BS +, positive distension, tenderness, no rebound or guarding.  Musculoskeletal: Normal range of motion. 2+ edema to abdomen and no tenderness.  Neuro: Alert. CN 2-12 grossly intact. No focal deficits. Skin: Skin is  warm and dry. No rash noted. Psychiatric: Normal mood and affect.   LABORATORY PANEL:   CBC  Recent Labs Lab 04/17/17 0428  WBC 12.0*  HGB 8.7*  HCT 25.3*  PLT 274   ------------------------------------------------------------------------------------------------------------------  Chemistries   Recent Labs Lab 04/18/17 0547 04/19/17 0510  NA 138 138  K 3.7 4.6  CL 100* 99*  CO2 31 33*  GLUCOSE 103* 102*  BUN 30* 28*  CREATININE 0.90 0.81  CALCIUM 9.1 9.2  MG 2.0  --   AST 15  --   ALT 10*  --   ALKPHOS 90  --   BILITOT 4.4*  --    ------------------------------------------------------------------------------------------------------------------  Cardiac Enzymes  Recent Labs Lab 04/14/17 2140 04/15/17 0145 04/15/17 1303  TROPONINI 0.13* 0.19* 0.14*   ------------------------------------------------------------------------------------------------------------------  RADIOLOGY:  No results found.   ASSESSMENT AND PLAN:   46 year old female with chronic hypoxic respiratory failure, chronic diastolic heart failure and severe pulmonary hypertension who presented with acute on chronic hypoxic respiratory failure  * Acute on chronic hypoxic respiratory failure due to CHF exacerbation and severe pulmonary hypertension Off of HFNC and now on [place].  * Acute on chronic diastolic heart failure due to severe pulmonary hypertension/cardiogenic shock: Patient was on dopamine. Continue Sidenafil Ultimately will need to go to for evaluation of severe pulmonary hypertension, Dr Alva Garnet has spoken with Dr Malvin Johns (Pulm HTN clinic) at [facility].  * Ascites from cor pulmonale s/p paracentesis 1 L removed on April 17 Continue diuresis  * Elevated troponin from demand ischemia not ACS  * Hyponatremia from CHF  Improving with diuresis  * Chronic anemia Continue to monitor hemoglobin. No need for transfusion at this time.  Management plans discussed with the patient and she is  in agreement.  Will have PT eval. Likely  d/c in AM  CODE STATUS: FULL  TOTAL TIME TAKING CARE OF THIS PATIENT: 35 minutes.   Hillary Bow R M.D on 04/19/2017 at 2:29 PM  Between 7am to 6pm - Pager - [phone]  After 6pm go to [email or web address] - password [email or web address] [place] [facility]  Office  [phone]  CC: Primary care physician; Danelle Berry, NP  Note: This dictation was prepared with Dragon dictation along with smaller phrase technology. Any transcriptional errors that result from this process are unintentional.

## 2017-04-19 NOTE — Progress Notes (Signed)
C/O SOB, O2 sat 88% on O2 @ 4lpm/[place]; C/DB, IS use without improvement; Respiratory therapist called for Duoneb Rx. Barbaraann Faster, RN 12:36 AM; 12:36 AM

## 2017-04-19 NOTE — Progress Notes (Signed)
Pt is in no distress. Pt is refusing to wear Cpap. Cpap not in room

## 2017-04-19 NOTE — Progress Notes (Signed)
Continues to feel better. No new complaints  Vitals:   04/19/17 0049 04/19/17 0500 04/19/17 0550 04/19/17 0820  BP:   (!) 87/54 (!) 96/58  Pulse:   83 (!) 101  Resp:   16   Temp:   97.7 F (36.5 C)   TempSrc:   Oral   SpO2: 93%  97%   Weight:  63.7 kg (140 lb 6.4 oz)    Height:        PHYSICAL EXAMINATION: No overt distress HEENT WNL Jugular venous distention No wheezes Tachy, reg, harsh systolic murmur across the precordium [place] distended abdomen, nontender 1+ symmetric pretibial edema No focal neurologic deficits, diffusely weak  BMP Latest Ref Rng & Units 04/19/2017 04/18/2017 04/17/2017  Glucose 65 - 99 mg/dL 102(H) 103(H) 146(H)  BUN 6 - 20 mg/dL 28(H) 30(H) 39(H)  Creatinine 0.44 - 1.00 mg/dL 0.81 0.90 1.30(H)  Sodium 135 - 145 mmol/L 138 138 134(L)  Potassium 3.5 - 5.1 mmol/L 4.6 3.7 3.3(L)  Chloride 101 - 111 mmol/L 99(L) 100(L) 99(L)  CO2 22 - 32 mmol/L 33(H) 31 27  Calcium 8.9 - 10.3 mg/dL 9.2 9.1 8.7(L)   CBC Latest Ref Rng & Units 04/17/2017 04/15/2017 04/14/2017  WBC 3.6 - 11.0 K/uL 12.0(H) 11.7(H) 10.8  Hemoglobin 12.0 - 16.0 g/dL 8.7(L) 9.4(L) 8.9(L)  Hematocrit 35.0 - 47.0 % 25.3(L) 27.2(L) 25.8(L)  Platelets 150 - 440 K/uL 274 283 274   No new CXR   ASSESSMENT/PLAN Acute on chronic hypoxemic respiratory failure Moderate COPD without acute bronchospaasm Severe pulmonary hypertension with decompensated RV failure  Due to COPD, Hb-SS and chronic hypoxemia Severe volume overload - much improved. Down 14 liters since admission Paroxysmal atrial fibrillation > NSR - chronic anticoagulation with rivaroxaban Elevated bilirubin - likely hepatic congestion - improving Sickle cell anemia Acute kidney injury, resolving  PLAN/REC: Continue supplemental oxygen to keep saturation greater than 92%   4 LPM [place] prior to admission Continue inhaled ICS/LABA and LAMA   DC home on previous regimen of Symbicort and Spiriva Continue sildenafil 20 mg TID for PAH -  initiation date 04/15/17  She has clinically responded well to this Cont digoxin - initiated 04/19 Will likely need a chronic diuretic regimen - suggests Lasix once a day and spironolactone once a day Cont prior dose of rivaroxaban Counseled again re: smoking cessation counseling  Transfer to med-surg with cardiac monitoring   I have requested evaluation by Dr Lamonte Sakai @ [facility] in [place] for pulmonary hypertension She should also follow up with Dr Raul Del  PCCM will sign off. Please call if we can be of further assistance  Merton Border, MD PCCM service Mobile [phone] Pager [phone] 04/19/2017 12:12 PM

## 2017-04-20 LAB — CBC WITH DIFFERENTIAL/PLATELET
BAND NEUTROPHILS: 0 %
BASOS ABS: 0 10*3/uL (ref 0–0.1)
BLASTS: 0 %
Basophils Relative: 0 %
EOS ABS: 0.1 10*3/uL (ref 0–0.7)
Eosinophils Relative: 1 %
HEMATOCRIT: 24.3 % — AB (ref 35.0–47.0)
HEMOGLOBIN: 8.4 g/dL — AB (ref 12.0–16.0)
Lymphocytes Relative: 22 %
Lymphs Abs: 2.2 10*3/uL (ref 1.0–3.6)
MCH: 38 pg — ABNORMAL HIGH (ref 26.0–34.0)
MCHC: 34.6 g/dL (ref 32.0–36.0)
MCV: 110.1 fL — ABNORMAL HIGH (ref 80.0–100.0)
METAMYELOCYTES PCT: 0 %
MYELOCYTES: 0 %
Monocytes Absolute: 0.7 10*3/uL (ref 0.2–0.9)
Monocytes Relative: 7 %
NEUTROS ABS: 7.2 10*3/uL — AB (ref 1.4–6.5)
Neutrophils Relative %: 70 %
Other: 0 %
PROMYELOCYTES ABS: 0 %
Platelets: 273 10*3/uL (ref 150–440)
RBC: 2.21 MIL/uL — ABNORMAL LOW (ref 3.80–5.20)
RDW: 21.4 % — ABNORMAL HIGH (ref 11.5–14.5)
WBC: 10.2 10*3/uL (ref 3.6–11.0)
nRBC: 7 /100 WBC — ABNORMAL HIGH

## 2017-04-20 LAB — BASIC METABOLIC PANEL
Anion gap: 7 (ref 5–15)
BUN: 27 mg/dL — AB (ref 6–20)
CALCIUM: 9.1 mg/dL (ref 8.9–10.3)
CO2: 35 mmol/L — ABNORMAL HIGH (ref 22–32)
Chloride: 94 mmol/L — ABNORMAL LOW (ref 101–111)
Creatinine, Ser: 0.62 mg/dL (ref 0.44–1.00)
GFR calc Af Amer: >60 mL/min (ref >60)
Glucose, Bld: 97 mg/dL (ref 65–99)
Potassium: 3.8 mmol/L (ref 3.5–5.1)
Sodium: 136 mmol/L (ref 135–145)

## 2017-04-20 MED ORDER — ACETAZOLAMIDE 250 MG PO TABS
500.0000 mg | ORAL_TABLET | Freq: Once | ORAL | Status: AC
Start: 2017-04-20 — End: 2017-04-20
  Administered 2017-04-20: 500 mg via ORAL
  Filled 2017-04-20 (×2): qty 2

## 2017-04-20 MED ORDER — OXYCODONE HCL 5 MG PO TABS
5.0000 mg | ORAL_TABLET | ORAL | Status: DC | PRN
  Administered 2017-04-20: 5 mg via ORAL
  Filled 2017-04-20 (×2): qty 1

## 2017-04-20 MED ORDER — MORPHINE SULFATE (PF) 2 MG/ML IV SOLN
2.0000 mg | Freq: Once | INTRAVENOUS | Status: AC
  Administered 2017-04-20: 2 mg via INTRAVENOUS
  Filled 2017-04-20: qty 1

## 2017-04-20 MED ORDER — OXYCODONE HCL 5 MG PO TABS
5.0000 mg | ORAL_TABLET | Freq: Once | ORAL | Status: AC
  Administered 2017-04-20: 5 mg via ORAL

## 2017-04-20 MED ORDER — OXYCODONE HCL 5 MG PO TABS: 10.0000 mg | ORAL_TABLET | ORAL | Status: DC | PRN

## 2017-04-20 MED ORDER — ACETAZOLAMIDE ER 500 MG PO CP12
500.0000 mg | ORAL_CAPSULE | Freq: Once | ORAL | Status: DC
  Filled 2017-04-20: qty 1

## 2017-04-20 MED ORDER — OXYCODONE HCL 5 MG PO TABS
10.0000 mg | ORAL_TABLET | ORAL | Status: DC | PRN
  Administered 2017-04-21 – 2017-04-24 (×9): 10 mg via ORAL
  Filled 2017-04-20 (×9): qty 2

## 2017-04-20 MED ORDER — POTASSIUM CHLORIDE CRYS ER 20 MEQ PO TBCR
40.0000 meq | EXTENDED_RELEASE_TABLET | ORAL | Status: AC
  Administered 2017-04-20 (×2): 40 meq via ORAL
  Filled 2017-04-20 (×2): qty 2

## 2017-04-20 NOTE — Progress Notes (Signed)
Pt is refusing Bipap for sleep. Bipap is not in room. Pt is not in distress

## 2017-04-20 NOTE — Clinical Social Work Note (Signed)
Clinical Social Work Assessment  Patient Details  Name: Shannon Schneider MRN: [id number] Date of Birth: 17-Jun-1971  Date of referral:  04/20/17               Reason for consult:  Facility Placement                Permission sought to share information with:  Chartered certified accountant granted to share information::  Yes, Verbal Permission Granted  Name::        Agency::     Relationship::     Contact Information:     Housing/Transportation Living arrangements for the past 2 months:  Single Family Home Source of Information:  Patient Patient Interpreter Needed:  None Criminal Activity/Legal Involvement Pertinent to Current Situation/Hospitalization:  No - Comment as needed Significant Relationships:  Spouse Lives with:  Spouse Do you feel safe going back to the place where you live?  Yes Need for family participation in patient care:  No (Coment)  Care giving concerns:  PT rec for STR   Social Worker assessment / plan:  The CSW met with the patient at bedside to discuss dc planning. The patient indicated that she is willing to pursue SNF placement and indicated [facility] as her preference. CSW explained the referral process.  At baseline, the patient lives with her spouse Shannon Schneider and has help from her sister Shannon Schneider when needed. The patient is independent with all ADLs and IADLs and ambulates independently. Possible dc tomorrow or Tuesday.    Employment status:  Retired Advertising copywriter PT Recommendations:  [place] / Referral to community resources:  [place]  Patient/Family's Response to care:  Patient thanked CSW.  Patient/Family's Understanding of and Emotional Response to Diagnosis, Current Treatment, and Prognosis:  Patient in agreement with dc plan.  Emotional Assessment Appearance:  Appears younger than stated age Attitude/Demeanor/Rapport:  Lethargic Affect (typically observed):  Appropriate,  Pleasant Orientation:  Oriented to Self, Oriented to Place, Oriented to  Time, Oriented to Situation Alcohol / Substance use:  Never Used Psych involvement (Current and /or in the community):  No (Comment)  Discharge Needs  Concerns to be addressed:  Discharge Planning Concerns, Care Coordination Readmission within the last 30 days:  No Current discharge risk:  Chronically ill Barriers to Discharge:  Continued Medical Work up   [employer], LCSW 04/20/2017, 4:21 PM

## 2017-04-20 NOTE — Progress Notes (Signed)
[place] [facility] [employer] NAME: Shannon Schneider    MR#:  [id number]  DATE OF BIRTH:  11/17/1971  SUBJECTIVE:   Feels weak. Has chronic shortness of breath and on 5 L oxygen at home.   REVIEW OF SYSTEMS:    Review of Systems  Constitutional: Negative for fever, chills weight loss HENT: Negative for ear pain, nosebleeds, congestion, facial swelling, rhinorrhea, neck pain, neck stiffness and ear discharge.   Respiratory: Negative for cough, +shortness of breath (slightly better), wheezing  Cardiovascular: Negative for chest pain, palpitations and +++leg swelling.  Gastrointestinal: Negative for heartburn,no  vomiting, diarrhea or consitpation +abdominal distension Genitourinary: Negative for dysuria, urgency, frequency, hematuria Musculoskeletal: Negative for back pain or joint pain Neurological: Negative for dizziness, seizures, syncope, focal weakness,  numbness and headaches.  Hematological: Does not bruise/bleed easily.  Psychiatric/Behavioral: Negative for hallucinations, confusion, dysphoric mood  DRUG ALLERGIES:  No Known Allergies  VITALS:  Blood pressure (!) 92/56, pulse (!) 103, temperature 97.9 F (36.6 C), temperature source Oral, resp. rate 16, height 5' (1.524 m), weight 50.4 kg (111 lb 1.6 oz), last menstrual period 04/14/2017, SpO2 97 %.  PHYSICAL EXAMINATION:  Constitutional: Appears Chronically ill  HENT: Normocephalic. Marland Kitchen Oropharynx is clear and moist.  Eyes: Conjunctivae and EOM are normal. PERRLA, no scleral icterus.  Neck: Normal ROM. Neck supple. Positive JVD. No tracheal deviation. CVS: RRR, S1/S2 , no gallops, no carotid bruit.  Pulmonary: Effort and breath sounds normal, no stridor, rhonchi, wheezes, rales.  Abdominal: Soft. BS +, positive distension, tenderness, no rebound or guarding.  Musculoskeletal: Normal range of motion. 2+ edema to abdomen and no tenderness.  Neuro: Alert. CN 2-12 grossly intact. No focal  deficits. Skin: Skin is warm and dry. No rash noted. Psychiatric: Normal mood and affect.   LABORATORY PANEL:   CBC  Recent Labs Lab 04/20/17 0513  WBC 10.2  HGB 8.4*  HCT 24.3*  PLT 273   ------------------------------------------------------------------------------------------------------------------  Chemistries   Recent Labs Lab 04/18/17 0547  04/20/17 0513  NA 138  < > 136  K 3.7  < > 3.8  CL 100*  < > 94*  CO2 31  < > 35*  GLUCOSE 103*  < > 97  BUN 30*  < > 27*  CREATININE 0.90  < > 0.62  CALCIUM 9.1  < > 9.1  MG 2.0  --   --   AST 15  --   --   ALT 10*  --   --   ALKPHOS 90  --   --   BILITOT 4.4*  --   --   < > = values in this interval not displayed. ------------------------------------------------------------------------------------------------------------------  Cardiac Enzymes  Recent Labs Lab 04/14/17 2140 04/15/17 0145 04/15/17 1303  TROPONINI 0.13* 0.19* 0.14*   ------------------------------------------------------------------------------------------------------------------  RADIOLOGY:  No results found.   ASSESSMENT AND PLAN:   46 year old female with chronic hypoxic respiratory failure, chronic diastolic heart failure and severe pulmonary hypertension who presented with acute on chronic hypoxic respiratory failure  * Acute on chronic hypoxic respiratory failure due to CHF exacerbation and severe pulmonary hypertension Off of HFNC and now on [place].  * Acute on chronic diastolic heart failure due to severe pulmonary hypertension/cardiogenic shock: Patient was on dopamine. Continue Sidenafil Ultimately will need to go to for evaluation of severe pulmonary hypertension, Dr Alva Garnet has spoken with Dr Malvin Johns (Pulm HTN clinic) at [facility].  * Ascites from cor pulmonale s/p paracentesis 1 L removed on  April 17 Continue diuresis  * Elevated troponin from demand ischemia not ACS  * Hyponatremia from CHF  Improving with diuresis  * Chronic  anemia Continue to monitor hemoglobin. No need for transfusion at this time.  Management plans discussed with the patient and she is in agreement.  SNF in 1-2 days  CODE STATUS: FULL  TOTAL TIME TAKING CARE OF THIS PATIENT: 35 minutes.   Hillary Bow R M.D on 04/20/2017 at 12:23 PM  Between 7am to 6pm - Pager - [phone]  After 6pm go to [email or web address] - password [email or web address] [facility]  Office  [phone]  CC: Primary care physician; Danelle Berry, NP  Note: This dictation was prepared with Dragon dictation along with smaller phrase technology. Any transcriptional errors that result from this process are unintentional.

## 2017-04-20 NOTE — NC FL2 (Signed)
[place] LEVEL OF CARE SCREENING TOOL     IDENTIFICATION  Patient Name: Shannon Schneider Birthdate: March 19, 1971 Sex: female Admission Date (Current Location): 04/14/2017  [place] and [place] Number:  Engineering geologist and Address:  [facility], [address], [place], [place] [address]      Provider Number: [id number]  Attending Physician Name and Address:  Hillary Bow, MD  Relative Name and Phone Number:       Current Level of Care: Hospital Recommended Level of Care: [place] Prior Approval Number:    Date Approved/Denied: 04/20/17 PASRR Number: [id number]  Discharge Plan: SNF    Current Diagnoses: Patient Active Problem List   Diagnosis Date Noted  . Ascites   . Increased liver enzymes   . Acute on chronic respiratory failure with hypoxia ([place])   . Moderate COPD (chronic obstructive pulmonary disease) ([place])   . Moderate to severe pulmonary hypertension ([place])   . Hb-SS disease without crisis ([place])   . Cor pulmonale (chronic) ([place])   . Elevated troponin   . CHF (congestive heart failure) ([place]) 03/12/2017  . Shortness of breath   . Hyponatremia 12/30/2016  . Mass of perirectal soft tissue 06/22/2015    Orientation RESPIRATION BLADDER Height & Weight     Self, Time, Situation, Place  O2 (5L o2, chronic) Continent Weight: 111 lb 1.6 oz (50.4 kg) Height:  5' (152.4 cm)  BEHAVIORAL SYMPTOMS/MOOD NEUROLOGICAL BOWEL NUTRITION STATUS      Continent    AMBULATORY STATUS COMMUNICATION OF NEEDS Skin   Extensive Assist Verbally Normal                       Personal Care Assistance Level of Assistance  Bathing, Feeding, Dressing Bathing Assistance: Limited assistance Feeding assistance: Independent Dressing Assistance: Limited assistance     Functional Limitations Info             SPECIAL CARE FACTORS FREQUENCY  PT (By licensed PT)     PT Frequency: Up to 5X per day, 5 days per week               Contractures      Additional Factors Info                  Current Medications (04/20/2017):  This is the current hospital active medication list Current Facility-Administered Medications  Medication Dose Route Frequency Provider Last Rate Last Dose  . 0.9 %  sodium chloride infusion  250 mL Intravenous PRN Bettey Costa, MD      . acetaminophen (TYLENOL) tablet 650 mg  650 mg Oral Q6H PRN Bettey Costa, MD   650 mg at 04/15/17 3875   Or  . acetaminophen (TYLENOL) suppository 650 mg  650 mg Rectal Q6H PRN Bettey Costa, MD      . bisacodyl (DULCOLAX) EC tablet 5 mg  5 mg Oral Daily PRN Bettey Costa, MD   5 mg at 04/19/17 0918  . chlorhexidine (PERIDEX) 0.12 % solution 15 mL  15 mL Mouth Rinse BID Awilda Bill, NP   15 mL at 04/20/17 1007  . cholecalciferol (VITAMIN D) tablet 2,000 Units  2,000 Units Oral Daily Bettey Costa, MD   2,000 Units at 04/20/17 1006  . citalopram (CELEXA) tablet 10 mg  10 mg Oral QHS Bettey Costa, MD   10 mg at 04/19/17 2311  . digoxin (LANOXIN) tablet 0.125 mg  0.125 mg Oral Daily Nicki Reaper D  Christy, RPH   0.125 mg at 04/20/17 1005  . furosemide (LASIX) tablet 40 mg  40 mg Oral BID Hillary Bow, MD   40 mg at 04/20/17 0800  . ipratropium-albuterol (DUONEB) 0.5-2.5 (3) MG/3ML nebulizer solution 3 mL  3 mL Nebulization Q6H PRN Awilda Bill, NP   3 mL at 04/19/17 0045  . MEDLINE mouth rinse  15 mL Mouth Rinse q12n4p Awilda Bill, NP   15 mL at 04/19/17 1140  . metoprolol tartrate (LOPRESSOR) tablet 12.5 mg  12.5 mg Oral BID Wilhelmina Mcardle, MD   12.5 mg at 04/20/17 1005  . mometasone-formoterol (DULERA) 200-5 MCG/ACT inhaler 2 puff  2 puff Inhalation BID Bettey Costa, MD   2 puff at 04/20/17 1003  . ondansetron (ZOFRAN) injection 4 mg  4 mg Intravenous QID PRN Bincy S Varughese, NP       Or  . ondansetron (ZOFRAN) tablet 4 mg  4 mg Oral Q6H PRN Bincy S Varughese, NP      . oxyCODONE (Oxy IR/ROXICODONE) immediate release tablet 10 mg  10 mg Oral Q4H PRN Srikar  Sudini, MD      . rivaroxaban (XARELTO) tablet 20 mg  20 mg Oral Daily Wilhelmina Mcardle, MD   20 mg at 04/20/17 1007  . senna-docusate (Senokot-S) tablet 1 tablet  1 tablet Oral QHS PRN Bettey Costa, MD   1 tablet at 04/18/17 2116  . sildenafil (REVATIO) tablet 20 mg  20 mg Oral TID Wilhelmina Mcardle, MD   20 mg at 04/20/17 1624  . sodium chloride flush (NS) 0.9 % injection 10-40 mL  10-40 mL Intracatheter Q12H Theodoro Grist, MD   10 mL at 04/20/17 1015  . sodium chloride flush (NS) 0.9 % injection 10-40 mL  10-40 mL Intracatheter PRN Theodoro Grist, MD      . sodium chloride flush (NS) 0.9 % injection 3 mL  3 mL Intravenous Q12H Bettey Costa, MD   3 mL at 04/19/17 2316  . sodium chloride flush (NS) 0.9 % injection 3 mL  3 mL Intravenous PRN Bettey Costa, MD      . tiotropium (SPIRIVA) inhalation capsule 18 mcg  1 capsule Inhalation Daily Bettey Costa, MD   18 mcg at 04/20/17 1002     Discharge Medications: Please see discharge summary for a list of discharge medications.  Relevant Imaging Results:  Relevant Lab Results:   Additional Information SS# [id number]  Zettie Pho, LCSW

## 2017-04-20 NOTE — Progress Notes (Signed)
SUBJECTIVE: Pt is feeling much better. [place] setting is almost back to her baseline.   Vitals:   04/19/17 2322 04/20/17 0459 04/20/17 0500 04/20/17 0953  BP: (!) 96/51 (!) 93/55  (!) 92/56  Pulse: (!) 105 97  (!) 103  Resp:  16    Temp:  97.9 F (36.6 C)    TempSrc:  Oral    SpO2: 90% 95%  97%  Weight:   111 lb 1.6 oz (50.4 kg)   Height:        Intake/Output Summary (Last 24 hours) at 04/20/17 1106 Last data filed at 04/20/17 1005  Gross per 24 hour  Intake              600 ml  Output             2200 ml  Net            -1600 ml    LABS: Basic Metabolic Panel:  Recent Labs  04/18/17 0547 04/19/17 0510 04/20/17 0513  NA 138 138 136  K 3.7 4.6 3.8  CL 100* 99* 94*  CO2 31 33* 35*  GLUCOSE 103* 102* 97  BUN 30* 28* 27*  CREATININE 0.90 0.81 0.62  CALCIUM 9.1 9.2 9.1  MG 2.0  --   --    Liver Function Tests:  Recent Labs  04/18/17 0547  AST 15  ALT 10*  ALKPHOS 90  BILITOT 4.4*  PROT 6.3*  ALBUMIN 3.6   No results for input(s): LIPASE, AMYLASE in the last 72 hours. CBC:  Recent Labs  04/20/17 0513  WBC 10.2  NEUTROABS 7.2*  HGB 8.4*  HCT 24.3*  MCV 110.1*  PLT 273   Cardiac Enzymes: No results for input(s): CKTOTAL, CKMB, CKMBINDEX, TROPONINI in the last 72 hours. BNP: Invalid input(s): POCBNP D-Dimer: No results for input(s): DDIMER in the last 72 hours. Hemoglobin A1C: No results for input(s): HGBA1C in the last 72 hours. Fasting Lipid Panel: No results for input(s): CHOL, HDL, LDLCALC, TRIG, CHOLHDL, LDLDIRECT in the last 72 hours. Thyroid Function Tests: No results for input(s): TSH, T4TOTAL, T3FREE, THYROIDAB in the last 72 hours.  Invalid input(s): FREET3 Anemia Panel: No results for input(s): VITAMINB12, FOLATE, FERRITIN, TIBC, IRON, RETICCTPCT in the last 72 hours.   PHYSICAL EXAM General: Chronically ill appearing but in no acute distress HEENT:  Normocephalic and atramatic Neck:  No JVD.  Lungs: Clear bilaterally with reduced  breath sounds in bases. Heart: HRRR . Normal S1 and S2 without gallops or murmurs.  Abdomen: Mild tenderness to palpation. Msk:  Back normal, normal gait. Normal strength and tone for age. Extremities: No clubbing, cyanosis or edema.   Neuro: Alert and oriented X 3. Psych:  Good affect, responds appropriately  TELEMETRY: NSR 98bpm  ASSESSMENT AND PLAN: Acute on chronic CHF with severe pulmonary HTN.  Continue digoxin, metoprolol, and lasix. Continue Xarelto as patient appears to be having intermittent atrial flutter. Pursue follow up for pulmonary HTN as noted in by Dr. Darvin Neighbours. If she remains stable, may discharge tomorrow to rehab as she is almost back to her baseline oxygen requirements.   Active Problems:   CHF (congestive heart failure) (HCC)   Ascites   Increased liver enzymes   Acute on chronic respiratory failure with hypoxia (HCC)   Moderate COPD (chronic obstructive pulmonary disease) ([facility])   Moderate to severe pulmonary hypertension ([place])   Hb-SS disease without crisis ([place])   Cor pulmonale (chronic) ([place])    [name],  NP-C 04/20/2017 11:06 AM

## 2017-04-21 ENCOUNTER — Inpatient Hospital Stay: Payer: BLUE CROSS/BLUE SHIELD

## 2017-04-21 MED ORDER — FUROSEMIDE 40 MG PO TABS: 40.0000 mg | ORAL_TABLET | Freq: Every day | ORAL | Status: DC

## 2017-04-21 MED ORDER — DIGOXIN 125 MCG PO TABS: 0.1250 mg | ORAL_TABLET | Freq: Every day | ORAL | 0 refills | Status: AC

## 2017-04-21 MED ORDER — FUROSEMIDE 40 MG PO TABS: 40.0000 mg | ORAL_TABLET | Freq: Every day | ORAL | 0 refills | Status: DC

## 2017-04-21 MED ORDER — OXYCODONE HCL 10 MG PO TABS: 10.0000 mg | ORAL_TABLET | Freq: Four times a day (QID) | ORAL | 0 refills | Status: DC | PRN

## 2017-04-21 MED ORDER — SILDENAFIL CITRATE 20 MG PO TABS: 20.0000 mg | ORAL_TABLET | Freq: Three times a day (TID) | ORAL | 0 refills | Status: DC

## 2017-04-21 MED ORDER — FUROSEMIDE 40 MG PO TABS
40.0000 mg | ORAL_TABLET | Freq: Two times a day (BID) | ORAL | Status: DC
  Administered 2017-04-22: 40 mg via ORAL
  Filled 2017-04-21 (×2): qty 1

## 2017-04-21 MED ORDER — METOPROLOL TARTRATE 25 MG PO TABS: 12.5000 mg | ORAL_TABLET | Freq: Two times a day (BID) | ORAL | 0 refills | Status: DC

## 2017-04-21 NOTE — Progress Notes (Signed)
SUBJECTIVE: Feeling much better   Vitals:   04/20/17 2044 04/21/17 0438 04/21/17 0912 04/21/17 0922  BP: (!) 84/57 94/61 (!) 83/52   Pulse: 92 85 87 84  Resp: 18 16    Temp: 98.6 F (37 C) 98.2 F (36.8 C)    TempSrc:  Oral    SpO2: 100% 96% 91%   Weight:      Height:        Intake/Output Summary (Last 24 hours) at 04/21/17 0950 Last data filed at 04/21/17 0438  Gross per 24 hour  Intake              801 ml  Output             1800 ml  Net             -999 ml    LABS: Basic Metabolic Panel:  Recent Labs  04/19/17 0510 04/20/17 0513  NA 138 136  K 4.6 3.8  CL 99* 94*  CO2 33* 35*  GLUCOSE 102* 97  BUN 28* 27*  CREATININE 0.81 0.62  CALCIUM 9.2 9.1   Liver Function Tests: No results for input(s): AST, ALT, ALKPHOS, BILITOT, PROT, ALBUMIN in the last 72 hours. No results for input(s): LIPASE, AMYLASE in the last 72 hours. CBC:  Recent Labs  04/20/17 0513  WBC 10.2  NEUTROABS 7.2*  HGB 8.4*  HCT 24.3*  MCV 110.1*  PLT 273   Cardiac Enzymes: No results for input(s): CKTOTAL, CKMB, CKMBINDEX, TROPONINI in the last 72 hours. BNP: Invalid input(s): POCBNP D-Dimer: No results for input(s): DDIMER in the last 72 hours. Hemoglobin A1C: No results for input(s): HGBA1C in the last 72 hours. Fasting Lipid Panel: No results for input(s): CHOL, HDL, LDLCALC, TRIG, CHOLHDL, LDLDIRECT in the last 72 hours. Thyroid Function Tests: No results for input(s): TSH, T4TOTAL, T3FREE, THYROIDAB in the last 72 hours.  Invalid input(s): FREET3 Anemia Panel: No results for input(s): VITAMINB12, FOLATE, FERRITIN, TIBC, IRON, RETICCTPCT in the last 72 hours.   PHYSICAL EXAM General: Well developed, well nourished, in no acute distress HEENT:  Normocephalic and atramatic Neck:  No JVD.  Lungs: Clear bilaterally to auscultation and percussion. Heart: HRRR . Normal S1 and S2 without gallops or murmurs.  Abdomen: Bowel sounds are positive, abdomen soft and non-tender   Msk:  Back normal, normal gait. Normal strength and tone for age. Extremities: No clubbing, cyanosis or edema.   Neuro: Alert and oriented X 3. Psych:  Good affect, responds appropriately  TELEMETRY: NSR  ASSESSMENT AND PLAN: Right sided heart failure with secondary pulm. HTN. Agree with digoxin and 02.  Active Problems:   CHF (congestive heart failure) (HCC)   Ascites   Increased liver enzymes   Acute on chronic respiratory failure with hypoxia (HCC)   Moderate COPD (chronic obstructive pulmonary disease) (HCC)   Moderate to severe pulmonary hypertension (HCC)   Hb-SS disease without crisis ([place])   Cor pulmonale (chronic) (HCC)    Fabrizio Filip A, MD, [facility] 04/21/2017 9:50 AM

## 2017-04-21 NOTE — Discharge Instructions (Signed)
Low salt diet   - Daily fluids < 2 liters. - Low salt diet - Check weight everyday and keep log. Take to your doctors appt. - Take extra dose of lasix if you gain more than 3 pounds weight.

## 2017-04-21 NOTE — Progress Notes (Signed)
BP 89/49, MAP 58. MD notified. Do not give scheduled lasix or metoprolol per Dr. Bridgett Larsson. Will continue to assess.

## 2017-04-21 NOTE — Progress Notes (Signed)
Physical Therapy Treatment Patient Details Name: Shannon Schneider MRN: [id number] DOB: 02-15-1971 Today's Date: 04/21/2017    History of Present Illness Pt is a 46 y/o F who presented with increasing abdominal girth, LE edema, SOB.  Her normal BP is systolic in the 70W.  Chest x ray showed increasing R pleural effusion.  Pt admitted with acute on chronic hypoxic respiratory failure due to CHF exacerbation and severe pulmonary hypertension.  Pt's PMH includes CHF, COPD, oxygen dependent, sickle cell anemia.    PT Comments    Pt was in bed watching TV with her sister at bedside upon entry. Pt agreeable to PT treatment. Pt requested to use the bed side commode at beginning of session. Pt BP was 79/47 at beginning of session, and pt was asymptomatic and stated she had been getting up to the bed side commode with nursing. Pt was modified independent with bed mobility and CGA for transfers and ambulation. Pt O2 sats were 88% on 5L O2 on bed side commode and pt stated that she is usually around 88% on 4L at home. Pt transferred back to the bed from the bed side commode and her O2 sats dropped to 84% on 5L and took ~2 min of pursed lip breathing to return to 88%and her BP was 77/48. Pt ambulated 30ft to the other side of the bed and her O2 sats dropped to 76% on 5L and required ~4 min of pursed lip breathing before coming back up to 89%. Pt laid back down in bed and O2 sats were 94% on 5L and BP was 81/52 at end of session. PT will continue to work with the pt to increase activity tolerance and functional mobility while monitoring vitals. D/t impaired pulmonary tolerance with functional mobility current d/c recommendations remain appropriate.    Follow Up Recommendations  SNF     Equipment Recommendations  Rolling walker with 5" wheels;3in1 (PT) (Pt states that she has a RW and bed side commode at home)    Recommendations for Other Services       Precautions / Restrictions Precautions Precautions:  Fall Precaution Comments: Monitor BP, HR, SpO2 Restrictions Weight Bearing Restrictions: No    Mobility  Bed Mobility         Supine to sit: Modified independent (Device/Increase time);HOB elevated Sit to supine: Modified independent (Device/Increase time);HOB elevated   General bed mobility comments: Pt required increased time for bed mobility with HOB elevated  Transfers Overall transfer level: Needs assistance Equipment used: Rolling walker (2 wheeled) Transfers: Sit to/from Stand Sit to Stand: Min guard         General transfer comment: Pt required CGA for safety d/t vitals (see PT comments)  Ambulation/Gait Ambulation/Gait assistance: Min guard Ambulation Distance (Feet): 10 Feet Assistive device: Rolling walker (2 wheeled) Gait Pattern/deviations: WFL(Within Functional Limits)   Gait velocity interpretation: <1.8 ft/sec, indicative of risk for recurrent falls General Gait Details: Pt required CGA for safety d/t vitals (see vitals); Pt uses RW safely and appropriately, but requires a significant amount of time to walk 36ft   Stairs            Wheelchair Mobility    Modified Rankin (Stroke Patients Only)       Balance Overall balance assessment: Needs assistance Sitting-balance support: No upper extremity supported;Feet unsupported Sitting balance-Leahy Scale: Good Sitting balance - Comments: Supervision for sitting balance d/t vitals (see PT comments)   Standing balance support: Bilateral upper extremity supported;During functional activity Standing balance-Leahy Scale: Fair  Standing balance comment: CGA required in standing d/t vitals (see PT comments)                            Cognition Arousal/Alertness: Awake/alert Behavior During Therapy: WFL for tasks assessed/performed Overall Cognitive Status: Within Functional Limits for tasks assessed                                        Exercises      General  Comments General comments (skin integrity, edema, etc.): Pt's sister discussed concerns about taking care of pt at home d/t vitals and pt activity tolerance.  Pt voided and had a BM on bed side commode (RN notified). PT was called to see pt pending d/c. Notified RN of concerns of low BP and PT services were still requested to be performed today for d/c recommendations.       Pertinent Vitals/Pain Pain Assessment: 0-10 Pain Score: 2  Pain Location: B LE Pain Descriptors / Indicators: Tender;Constant;Discomfort Pain Intervention(s): Limited activity within patient's tolerance;Monitored during session;Repositioned  Vitals: see PT comments    Home Living                      Prior Function            PT Goals (current goals can now be found in the care plan section) Acute Rehab PT Goals Patient Stated Goal: to get stronger and return to PLOF PT Goal Formulation: With patient Time For Goal Achievement: 05/02/17 Potential to Achieve Goals: Good Progress towards PT goals: Progressing toward goals    Frequency    Min 2X/week      PT Plan Current plan remains appropriate    Co-evaluation             End of Session Equipment Utilized During Treatment: Gait belt;Oxygen (5L O2) Activity Tolerance: No increased pain;Treatment limited secondary to medical complications (Comment) (hypoxia, low BP (see PT comments)) Patient left: in bed;with bed alarm set;with family/visitor present;with call bell/phone within reach Nurse Communication: Mobility status (vitals) PT Visit Diagnosis: Muscle weakness (generalized) (M62.81);Unsteadiness on feet (R26.81)     Time: 7414-2395 PT Time Calculation (min) (ACUTE ONLY): 46 min  Charges:                       G Codes:         Detravion Tester, SPT 04/21/2017, 4:11 PM

## 2017-04-21 NOTE — Clinical Social Work Note (Signed)
Patient was reassessed by PT and due to her dropping of sats and low blood pressure, they are recommending STR. CSW was informed that her mobility is good but it is limited to endurance. CSW spoke at length with patient and her sister today and she has not decided if she still wishes to pursue rehab or if her sister will take a leave of absence and stay with her for 6 weeks. In the event that she wishes to continue with STR, [facility] has offered and CSW has asked that they begin the prior auth process with [employer]. Shela Leff MSW,LcSW [phone]

## 2017-04-21 NOTE — Progress Notes (Signed)
[facility] [place] NAME: Providence Stivers    MR#:  [id number]  DATE OF BIRTH:  02-21-1971  SUBJECTIVE:   On 5 L oxygen. Chronic shortness of breath.   REVIEW OF SYSTEMS:    Review of Systems  Constitutional: Negative for fever, chills weight loss HENT: Negative for ear pain, nosebleeds, congestion, facial swelling, rhinorrhea, neck pain, neck stiffness and ear discharge.   Respiratory: Negative for cough, +shortness of breath (slightly better), wheezing  Cardiovascular: Negative for chest pain, palpitations and +++leg swelling.  Gastrointestinal: Negative for heartburn,no  vomiting, diarrhea or consitpation +abdominal distension Genitourinary: Negative for dysuria, urgency, frequency, hematuria Musculoskeletal: Negative for back pain or joint pain Neurological: Negative for dizziness, seizures, syncope, focal weakness,  numbness and headaches.  Hematological: Does not bruise/bleed easily.  Psychiatric/Behavioral: Negative for hallucinations, confusion, dysphoric mood  DRUG ALLERGIES:  No Known Allergies  VITALS:  Blood pressure (!) 82/51, pulse 87, temperature 98.2 F (36.8 C), temperature source Oral, resp. rate 17, height 5' (1.524 m), weight 50.4 kg (111 lb 1.6 oz), last menstrual period 04/14/2017, SpO2 96 %.  PHYSICAL EXAMINATION:  Constitutional: Appears Chronically ill  HENT: Normocephalic. Marland Kitchen Oropharynx is clear and moist.  Eyes: Conjunctivae and EOM are normal. PERRLA, no scleral icterus.  Neck: Normal ROM. Neck supple. Positive JVD. No tracheal deviation. CVS: RRR, S1/S2 , no gallops, no carotid bruit.  Pulmonary: Effort and breath sounds normal, no stridor, rhonchi, wheezes, rales.  Abdominal: Soft. BS +, positive distension, tenderness, no rebound or guarding.  Musculoskeletal: Normal range of motion. 2+ edema to abdomen and no tenderness.  Neuro: Alert. CN 2-12 grossly intact. No focal deficits. Skin: Skin is warm and dry. No  rash noted. Psychiatric: Normal mood and affect.   LABORATORY PANEL:   CBC  Recent Labs Lab 04/20/17 0513  WBC 10.2  HGB 8.4*  HCT 24.3*  PLT 273   ------------------------------------------------------------------------------------------------------------------  Chemistries   Recent Labs Lab 04/18/17 0547  04/20/17 0513  NA 138  < > 136  K 3.7  < > 3.8  CL 100*  < > 94*  CO2 31  < > 35*  GLUCOSE 103*  < > 97  BUN 30*  < > 27*  CREATININE 0.90  < > 0.62  CALCIUM 9.1  < > 9.1  MG 2.0  --   --   AST 15  --   --   ALT 10*  --   --   ALKPHOS 90  --   --   BILITOT 4.4*  --   --   < > = values in this interval not displayed. ------------------------------------------------------------------------------------------------------------------  Cardiac Enzymes  Recent Labs Lab 04/14/17 2140 04/15/17 0145 04/15/17 1303  TROPONINI 0.13* 0.19* 0.14*   ------------------------------------------------------------------------------------------------------------------  RADIOLOGY:  No results found.   ASSESSMENT AND PLAN:   46 year old female with chronic hypoxic respiratory failure, chronic diastolic heart failure and severe pulmonary hypertension who presented with acute on chronic hypoxic respiratory failure  * Acute on chronic hypoxic respiratory failure due to CHF exacerbation and severe pulmonary hypertension Continue nasal cannula at 5 L  * Acute on chronic diastolic heart failure due to severe pulmonary hypertension/cardiogenic shock: was on dopamine. Continue Sidenafil Ultimately will need to go to for evaluation of severe pulmonary hypertension, Dr Alva Garnet has spoken with Dr Malvin Johns (Pulm HTN clinic) at [facility]  * Ascites from cor pulmonale s/p paracentesis 1 L removed on April 17 Continue diuresis  * Elevated troponin from  demand ischemia not ACS  * Hyponatremia from CHF  Improving with diuresis  * Chronic anemia Continue to monitor hemoglobin. No need for  transfusion at this time.  Management plans discussed with the patient and she is in agreement.  Physical therapy to see patient. Skilled nursing facility versus home health.  CODE STATUS: FULL  TOTAL TIME TAKING CARE OF THIS PATIENT: 35 minutes.   Hillary Bow R M.D on 04/21/2017 at 1:24 PM  Between 7am to 6pm - Pager - [phone]  After 6pm go to [email or web address] - password [email or web address] [facility]  Office  [phone]  CC: Primary care physician; Danelle Berry, NP  Note: This dictation was prepared with Dragon dictation along with smaller phrase technology. Any transcriptional errors that result from this process are unintentional.

## 2017-04-21 NOTE — Progress Notes (Signed)
Notified MD about low BP and reported drop in saturation from 88 to 74 with exertion per Physical Therapy. Patient resting in bed. 02 saturations back to baseline on 5L. Continue to assess.

## 2017-04-22 LAB — BASIC METABOLIC PANEL
ANION GAP: 6 (ref 5–15)
BUN: 35 mg/dL — AB (ref 6–20)
CALCIUM: 9.4 mg/dL (ref 8.9–10.3)
CO2: 34 mmol/L — ABNORMAL HIGH (ref 22–32)
Chloride: 99 mmol/L — ABNORMAL LOW (ref 101–111)
Creatinine, Ser: 0.93 mg/dL (ref 0.44–1.00)
GFR calc Af Amer: >60 mL/min (ref >60)
GLUCOSE: 93 mg/dL (ref 65–99)
POTASSIUM: 3.7 mmol/L (ref 3.5–5.1)
SODIUM: 139 mmol/L (ref 135–145)

## 2017-04-22 LAB — COMPREHENSIVE METABOLIC PANEL
ALBUMIN: 3.6 g/dL (ref 3.5–5.0)
ALT: 12 U/L — ABNORMAL LOW (ref 14–54)
ANION GAP: 9 (ref 5–15)
AST: 22 U/L (ref 15–41)
Alkaline Phosphatase: 113 U/L (ref 38–126)
BILIRUBIN TOTAL: 5.1 mg/dL — AB (ref 0.3–1.2)
BUN: 35 mg/dL — ABNORMAL HIGH (ref 6–20)
CALCIUM: 9.1 mg/dL (ref 8.9–10.3)
CO2: 31 mmol/L (ref 22–32)
Chloride: 97 mmol/L — ABNORMAL LOW (ref 101–111)
Creatinine, Ser: 0.99 mg/dL (ref 0.44–1.00)
GFR calc non Af Amer: >60 mL/min (ref >60)
Glucose, Bld: 116 mg/dL — ABNORMAL HIGH (ref 65–99)
POTASSIUM: 3.7 mmol/L (ref 3.5–5.1)
Sodium: 137 mmol/L (ref 135–145)
Total Protein: 6.9 g/dL (ref 6.5–8.1)

## 2017-04-22 LAB — MAGNESIUM: MAGNESIUM: 2 mg/dL (ref 1.7–2.4)

## 2017-04-22 LAB — DIGOXIN LEVEL: DIGOXIN LVL: 0.8 ng/mL (ref 0.8–2.0)

## 2017-04-22 LAB — PHOSPHORUS: Phosphorus: 4.5 mg/dL (ref 2.5–4.6)

## 2017-04-22 MED ORDER — RIVAROXABAN 20 MG PO TABS
20.0000 mg | ORAL_TABLET | Freq: Every day | ORAL | Status: DC
  Administered 2017-04-22 – 2017-04-23 (×2): 20 mg via ORAL
  Filled 2017-04-22 (×3): qty 1

## 2017-04-22 MED ORDER — POTASSIUM CHLORIDE CRYS ER 20 MEQ PO TBCR
40.0000 meq | EXTENDED_RELEASE_TABLET | Freq: Once | ORAL | Status: AC
  Administered 2017-04-22: 40 meq via ORAL
  Filled 2017-04-22: qty 2

## 2017-04-22 MED ORDER — FUROSEMIDE 40 MG PO TABS
40.0000 mg | ORAL_TABLET | Freq: Three times a day (TID) | ORAL | Status: DC
  Administered 2017-04-22 – 2017-04-23 (×2): 40 mg via ORAL
  Filled 2017-04-22 (×2): qty 1

## 2017-04-22 MED ORDER — ACETAZOLAMIDE 250 MG PO TABS
500.0000 mg | ORAL_TABLET | Freq: Once | ORAL | Status: AC
  Administered 2017-04-22: 500 mg via ORAL
  Filled 2017-04-22: qty 2

## 2017-04-22 NOTE — Progress Notes (Signed)
Advance care planning  Discussed with patient with sister at bedside.  She does not have anyone designated as her healthcare power of attorney. She wants her husband and sister to make decisions for her if she cannot get any point. Discussed with her regarding her advanced disease with severe pulmonary hypertension and significant congestive heart failure which has been difficult to control and worsening with time. She understands the poor prognosis. Is hopeful that when she follows up with pulmonary hypertension clinic she will have more answers.  She wishes to be full code at this time. Her husband and sister will make decisions at that point.  She wishes to go to rehabilitation for further physical therapy and transition home at a later date.  Time spent 20 minutes

## 2017-04-22 NOTE — Progress Notes (Signed)
MD notified of patients low bp. Orders to hold metoprolol and lasix x 1 now. MD is okay with BP as long as MAP stays in upper 50s-low 60s and does not continue to drop. Will continue to monitor.

## 2017-04-22 NOTE — Progress Notes (Signed)
Pt is refusing bedtime Bipap order

## 2017-04-22 NOTE — Progress Notes (Signed)
[place] [place] NAME: Shannon Schneider    MR#:  [id number]  DATE OF BIRTH:  September 22, 1971  SUBJECTIVE:   She got extremely short of breath and desatted yesterday working with physical therapy. With saturations in 70s on 5 L oxygen.  Still has lower extremity edema and abdominal distention.  On 5 L oxygen. Chronic shortness of breath.   REVIEW OF SYSTEMS:    Review of Systems  Constitutional: Negative for fever, chills weight loss HENT: Negative for ear pain, nosebleeds, congestion, facial swelling, rhinorrhea, neck pain, neck stiffness and ear discharge.   Respiratory: Negative for cough, +shortness of breath (slightly better), wheezing  Cardiovascular: Negative for chest pain, palpitations and +++leg swelling.  Gastrointestinal: Negative for heartburn,no  vomiting, diarrhea or consitpation +abdominal distension Genitourinary: Negative for dysuria, urgency, frequency, hematuria Musculoskeletal: Negative for back pain or joint pain Neurological: Negative for dizziness, seizures, syncope, focal weakness,  numbness and headaches.  Hematological: Does not bruise/bleed easily.  Psychiatric/Behavioral: Negative for hallucinations, confusion, dysphoric mood  DRUG ALLERGIES:  No Known Allergies  VITALS:  Blood pressure 93/62, pulse 85, temperature 98 F (36.7 C), resp. rate 16, height 5' (1.524 m), weight 57.9 kg (127 lb 11.2 oz), last menstrual period 04/14/2017, SpO2 100 %.  PHYSICAL EXAMINATION:  Constitutional: Appears Chronically ill  HENT: Normocephalic. Marland Kitchen Oropharynx is clear and moist.  Eyes: Conjunctivae and EOM are normal. PERRLA, no scleral icterus.  Neck: Normal ROM. Neck supple. Positive JVD. No tracheal deviation. CVS: RRR, S1/S2 , no gallops, no carotid bruit.  Pulmonary: Effort and breath sounds normal, no stridor, rhonchi, wheezes, rales.  Abdominal: Soft. BS +, positive distension, tenderness, no rebound or guarding.   Musculoskeletal: Normal range of motion. 2+ edema to abdomen and no tenderness.  Neuro: Alert. CN 2-12 grossly intact. No focal deficits. Skin: Skin is warm and dry. No rash noted. Psychiatric: Normal mood and affect.   LABORATORY PANEL:   CBC  Recent Labs Lab 04/20/17 0513  WBC 10.2  HGB 8.4*  HCT 24.3*  PLT 273   ------------------------------------------------------------------------------------------------------------------  Chemistries   Recent Labs Lab 04/18/17 0547  04/22/17 0600  NA 138  < > 139  K 3.7  < > 3.7  CL 100*  < > 99*  CO2 31  < > 34*  GLUCOSE 103*  < > 93  BUN 30*  < > 35*  CREATININE 0.90  < > 0.93  CALCIUM 9.1  < > 9.4  MG 2.0  --  2.0  AST 15  --   --   ALT 10*  --   --   ALKPHOS 90  --   --   BILITOT 4.4*  --   --   < > = values in this interval not displayed. ------------------------------------------------------------------------------------------------------------------  Cardiac Enzymes No results for input(s): TROPONINI in the last 168 hours. ------------------------------------------------------------------------------------------------------------------  RADIOLOGY:  Dg Chest 2 View  Result Date: 04/21/2017 CLINICAL DATA:  46 y/o F; congestive heart failure. History of sickle cell anemia. EXAM: CHEST  2 VIEW COMPARISON:  04/17/2017 chest radiograph. FINDINGS: Stable severe cardiomegaly. Stable right PICC line with tip in lower SVC. Stable small right greater than left pleural effusions and dependent bibasilar opacities probably representing associated atelectasis. Stable enlargement of central pulmonary arteries compatible pulmonary artery hypertension. Increase interstitial opacities. No acute osseous abnormality is evident. IMPRESSION: 1. Increase interstitial opacities probably represents development of interstitial edema. 2. Stable small right greater than left pleural effusions and bibasilar  opacities probably representing  atelectasis. 3. Stable severe cardiomegaly. Electronically Signed   By: Kristine Garbe M.D.   On: 04/21/2017 17:21   ASSESSMENT AND PLAN:   46 year old female with chronic hypoxic respiratory failure, chronic diastolic heart failure and severe pulmonary hypertension who presented with acute on chronic hypoxic respiratory failure  * Acute on chronic hypoxic respiratory failure due to CHF exacerbation and severe pulmonary hypertension Continue nasal cannula at 5 L  * Acute on chronic diastolic heart failure due to severe pulmonary hypertension/cardiogenic shock: was on dopamine. Continue Sidenafil Ultimately will need to go to for evaluation of severe pulmonary hypertension, Dr Alva Garnet has spoken with Dr Malvin Johns (Pulm HTN clinic) at [facility]. We'll increase Lasix to 3 times a day. Chest x-ray continues to show pulmonary edema. Will need further diuresis. Patient is acutely ill with significant desaturations on minimal movement with physical therapy.  * Ascites from cor pulmonale s/p paracentesis 1 L removed on April 17 Continue diuresis  * Elevated troponin from demand ischemia not ACS  * Hyponatremia from CHF  Improving with diuresis  * Chronic anemia Continue to monitor hemoglobin. No need for transfusion at this time.  Management plans discussed with the patient and she is in agreement.  Patient will need skilled nursing facility.  CODE STATUS: FULL  TOTAL TIME TAKING CARE OF THIS PATIENT: 35 minutes.   Hillary Bow R M.D on 04/22/2017 at 1:37 PM  Between 7am to 6pm - Pager - [phone]  After 6pm go to [email or web address] - password [email or web address] [facility]  Office  [phone]  CC: Primary care physician; Danelle Berry, NP  Note: This dictation was prepared with Dragon dictation along with smaller phrase technology. Any transcriptional errors that result from this process are unintentional.

## 2017-04-22 NOTE — Progress Notes (Signed)
SUBJECTIVE: Doing very well   Vitals:   04/21/17 2001 04/21/17 2225 04/22/17 0500 04/22/17 0533  BP:  (!) 88/55  103/61  Pulse: 96 94  80  Resp: (!) 24   18  Temp: 98.4 F (36.9 C)   97.8 F (36.6 C)  TempSrc: Oral   Oral  SpO2: 90%   96%  Weight:   127 lb 11.2 oz (57.9 kg)   Height:        Intake/Output Summary (Last 24 hours) at 04/22/17 0839 Last data filed at 04/22/17 0528  Gross per 24 hour  Intake              480 ml  Output                0 ml  Net              480 ml    LABS: Basic Metabolic Panel:  Recent Labs  04/20/17 0513 04/22/17 0600  NA 136 139  K 3.8 3.7  CL 94* 99*  CO2 35* 34*  GLUCOSE 97 93  BUN 27* 35*  CREATININE 0.62 0.93  CALCIUM 9.1 9.4  MG  --  2.0  PHOS  --  4.5   Liver Function Tests: No results for input(s): AST, ALT, ALKPHOS, BILITOT, PROT, ALBUMIN in the last 72 hours. No results for input(s): LIPASE, AMYLASE in the last 72 hours. CBC:  Recent Labs  04/20/17 0513  WBC 10.2  NEUTROABS 7.2*  HGB 8.4*  HCT 24.3*  MCV 110.1*  PLT 273   Cardiac Enzymes: No results for input(s): CKTOTAL, CKMB, CKMBINDEX, TROPONINI in the last 72 hours. BNP: Invalid input(s): POCBNP D-Dimer: No results for input(s): DDIMER in the last 72 hours. Hemoglobin A1C: No results for input(s): HGBA1C in the last 72 hours. Fasting Lipid Panel: No results for input(s): CHOL, HDL, LDLCALC, TRIG, CHOLHDL, LDLDIRECT in the last 72 hours. Thyroid Function Tests: No results for input(s): TSH, T4TOTAL, T3FREE, THYROIDAB in the last 72 hours.  Invalid input(s): FREET3 Anemia Panel: No results for input(s): VITAMINB12, FOLATE, FERRITIN, TIBC, IRON, RETICCTPCT in the last 72 hours.   PHYSICAL EXAM General: Well developed, well nourished, in no acute distress HEENT:  Normocephalic and atramatic Neck:  No JVD.  Lungs: Clear bilaterally to auscultation and percussion. Heart: HRRR . Normal S1 and S2 without gallops or murmurs.  Abdomen: Bowel sounds are  positive, abdomen soft and non-tender  Msk:  Back normal, normal gait. Normal strength and tone for age. Extremities: No clubbing, cyanosis or edema.   Neuro: Alert and oriented X 3. Psych:  Good affect, responds appropriately  TELEMETRY:Sinus rhythm  ASSESSMENT AND PLAN: Cor pulmonale with severe secondary pulmonary hypertension due to COPD and sickle cell disease with severely dilated right atrium and right ventricle doing better on digoxin and continuous oxygen. Patient has normal coronaries and normal left ventricular systolic function.  Active Problems:   CHF (congestive heart failure) (HCC)   Ascites   Increased liver enzymes   Acute on chronic respiratory failure with hypoxia (HCC)   Moderate COPD (chronic obstructive pulmonary disease) (HCC)   Moderate to severe pulmonary hypertension (HCC)   Hb-SS disease without crisis ([place])   Cor pulmonale (chronic) (HCC)    Jenaya Saar A, MD, [facility] 04/22/2017 8:39 AM

## 2017-04-22 NOTE — Progress Notes (Signed)
[facility] NOTE  Pharmacy Consult for Digoxin Indication: Atrial fibrillation/flutter and heart failure  No Known Allergies  Patient Measurements: Height: 5' (152.4 cm) Weight: 127 lb 11.2 oz (57.9 kg) (bed had to be zeroed out prior to weighing pt) IBW/kg (Calculated) : 45.5  Vital Signs: Temp: 97.8 F (36.6 C) (04/24 0533) Temp Source: Oral (04/24 0533) BP: 103/61 (04/24 0533) Pulse Rate: 80 (04/24 0533) Intake/Output from previous day: 04/23 0701 - 04/24 0700 In: 480 [P.O.:480] Out: -  Intake/Output from this shift: No intake/output data recorded.  Labs:  Recent Labs  04/20/17 0513 04/22/17 0600  WBC 10.2  --   HGB 8.4*  --   HCT 24.3*  --   PLT 273  --   CREATININE 0.62 0.93  MG  --  2.0  PHOS  --  4.5   Estimated Creatinine Clearance: 60.3 mL/min (by C-G formula based on SCr of 0.93 mg/dL).   Microbiology: Recent Results (from the past 720 hour(s))  MRSA PCR Screening     Status: None   Collection Time: 04/14/17  9:28 PM  Result Value Ref Range Status   MRSA by PCR NEGATIVE NEGATIVE Final    Comment:        The GeneXpert MRSA Assay (FDA approved for NASAL specimens only), is one component of a comprehensive MRSA colonization surveillance program. It is not intended to diagnose MRSA infection nor to guide or monitor treatment for MRSA infections.   CULTURE, BLOOD (ROUTINE X 2) w Reflex to ID Panel     Status: None   Collection Time: 04/14/17 10:37 PM  Result Value Ref Range Status   Specimen Description BLOOD RIGHT WRIST  Final   Special Requests   Final    BOTTLES DRAWN AEROBIC AND ANAEROBIC Blood Culture adequate volume   Culture NO GROWTH 5 DAYS  Final   Report Status 04/19/2017 FINAL  Final  CULTURE, BLOOD (ROUTINE X 2) w Reflex to ID Panel     Status: None   Collection Time: 04/14/17 10:47 PM  Result Value Ref Range Status   Specimen Description BLOOD LEFT WRIST  Final   Special Requests   Final    BOTTLES DRAWN AEROBIC  AND ANAEROBIC Blood Culture adequate volume   Culture NO GROWTH 5 DAYS  Final   Report Status 04/19/2017 FINAL  Final  Urine culture     Status: None   Collection Time: 04/14/17 11:40 PM  Result Value Ref Range Status   Specimen Description URINE, RANDOM  Final   Special Requests NONE  Final   Culture   Final    NO GROWTH Performed at [facility], [place] [address] [place], [place] [address]    Report Status 04/16/2017 FINAL  Final  Body fluid culture     Status: None   Collection Time: 04/15/17 11:20 AM  Result Value Ref Range Status   Specimen Description PERITONEAL  Final   Special Requests NONE  Final   Gram Stain   Final    RARE WBC PRESENT,BOTH PMN AND MONONUCLEAR NO ORGANISMS SEEN    Culture   Final    NO GROWTH 3 DAYS Performed at [facility], [place] [address] [place], [place] [address]    Report Status 04/18/2017 FINAL  Final    Medical History: Past Medical History:  Diagnosis Date  . Congestive heart failure (CHF) ([place]) 06/27/15  . COPD (chronic obstructive pulmonary disease) ([place]) 06/27/15  . Hyperbilirubinemia   . Oxygen dependent  06/27/15  . Rectal mass   . Sickle cell anemia (HCC)     Assessment: 46 y/o F with a h/o sickle cell disease and atrial arrhythmia admitted with CHF exacerbation and pulmonary hypertension ordered trial of digoxin. Patient with ARF that is resolving.   Goal of Therapy:  0.8-2 ng/ml  Plan:  Digoxin level 0.8 ng/mL after 5 days of therapy. Continue digoxin 0.125 mg po daily. Electrolytes WNL.   Laural Benes, Pharm.D., BCPS Clinical Pharmacist 04/22/2017,7:40 AM

## 2017-04-23 MED ORDER — FUROSEMIDE 40 MG PO TABS
40.0000 mg | ORAL_TABLET | Freq: Two times a day (BID) | ORAL | Status: DC
  Administered 2017-04-23 – 2017-04-24 (×2): 40 mg via ORAL
  Filled 2017-04-23 (×2): qty 1

## 2017-04-23 NOTE — Progress Notes (Signed)
Called Dr. Darvin Neighbours to let him know patients BP was low after giving her medications this morning, also told him that I walked her in the room and her O2 sats dropped to 80%.  He acknowledged and gave me an order to discontinue the sildenafil.

## 2017-04-23 NOTE — Progress Notes (Signed)
Pt states she feels better today she states her shortness of breath is better, she is able to ambulate. She is on 5L [place]  Vitals:   04/23/17 1057 04/23/17 1338 04/23/17 1342 04/23/17 1345  BP: (!) 86/60 (!) 74/42    Pulse: 78 77 83 92  Resp: 20 20    Temp: 97.9 F (36.6 C) 97.9 F (36.6 C)    TempSrc: Oral Oral    SpO2: 97% 90% (!) 85% (!) 83%  Weight:      Height:       REVIEW OF SYSTEMS: Positives in BOLD  Gen: Denies fever, chills, weight change, fatigue, night sweats HEENT: Denies blurred vision, double vision, hearing loss, tinnitus, sinus congestion, rhinorrhea, sore throat, neck stiffness, dysphagia PULM: mild shortness of breath, cough, sputum production, hemoptysis, wheezing CV: chest pain, edema, orthopnea, paroxysmal nocturnal dyspnea, palpitations GI: Denies abdominal pain, nausea, vomiting, diarrhea, hematochezia, melena, constipation, change in bowel habits GU: Denies dysuria, hematuria, polyuria, oliguria, urethral discharge Endocrine: Denies hot or cold intolerance, polyuria, polyphagia or appetite change Derm: Denies rash, dry skin, scaling or peeling skin change Heme: Denies easy bruising, bleeding, bleeding gums Neuro: headache, numbness, weakness, slurred speech, loss of memory or consciousness  PHYSICAL EXAMINATION: General: chronically ill appearing AA female, NAD  Neuro: alert and oriented, follows commands HEENT: supple, no JVD Cardiovascular: aflutter, s1s2, no murmur Lungs: dclear bilaterally.  Abdomen: distended, ascites, +BS x4, soft, taut, non tender Musculoskeletal: 3+ bilateral pitting lower extremity edema  Skin: intact no rashes or lesions  BMP Latest Ref Rng & Units 04/22/2017 04/22/2017 04/20/2017  Glucose 65 - 99 mg/dL 116(H) 93 97  BUN 6 - 20 mg/dL 35(H) 35(H) 27(H)  Creatinine 0.44 - 1.00 mg/dL 0.99 0.93 0.62  Sodium 135 - 145 mmol/L 137 139 136  Potassium 3.5 - 5.1 mmol/L 3.7 3.7 3.8  Chloride 101 - 111 mmol/L 97(L) 99(L) 94(L)  CO2 22 -  32 mmol/L 31 34(H) 35(H)  Calcium 8.9 - 10.3 mg/dL 9.1 9.4 9.1   CBC Latest Ref Rng & Units 04/20/2017 04/17/2017 04/15/2017  WBC 3.6 - 11.0 K/uL 10.2 12.0(H) 11.7(H)  Hemoglobin 12.0 - 16.0 g/dL 8.4(L) 8.7(L) 9.4(L)  Hematocrit 35.0 - 47.0 % 24.3(L) 25.3(L) 27.2(L)  Platelets 150 - 440 K/uL 273 274 283   I personally reviewed chest imaging, consistent with cardiomegaly, otherwise unremarkable.   ASSESSMENT/PLAN Acute on chronic hypoxemic respiratory failure--improved.  Smoker COPD; appears compensated.  Severe pulmonary hypertension-- suspect secondary to cardiac and pulmonary disease (CHF, moderate MV regurg, COPD, smoking, sickle cell anemia).  Doubt primary pulmonary hypertension, though this is difficult to ascertain without right heart catheterization.  The patient was empirically started on revatio, but has been mildly hypotensive.  Decompensated right ventricular failure Severe volume overload Paroxysmal atrial fibrillation - chronic anticoagulation with rivaroxaban Sickle cell anemia Acute kidney injury, Nonoliguric. Creatinine improving   PLAN/REC: --Continue supplemental oxygen to keep saturation greater than 92% and Bipap qhs for now  --Continue ICS/LABA and LAMA --Stop sildenafil (initiation date 04/15/17) and monitor BP. Patient will need outpatient PFT (if not done already) and outpatient 6 minute walk test to determine need for possible right heart cath and therapy for pulmonary hypertension. Follow up with Dr. Raul Del who is her pulmonary physician.  --Would decrease diuresis to bid for hypotension.  --Smoking cessation.   Marda Stalker, M.D. Pulmonary/Critical Care PCCM Consult Pager [phone] (please enter [phone])

## 2017-04-23 NOTE — Progress Notes (Addendum)
Physical Therapy Treatment Patient Details Name: Shannon Schneider MRN: [id number] DOB: 07/16/1971 Today's Date: 04/23/2017    History of Present Illness Pt is a 46 y/o F who presented with increasing abdominal girth, LE edema, SOB.  Her normal BP is systolic in the 34L.  Chest x ray showed increasing R pleural effusion.  Pt admitted with acute on chronic hypoxic respiratory failure due to CHF exacerbation and severe pulmonary hypertension.  Pt's PMH includes CHF, COPD, oxygen dependent, sickle cell anemia.    PT Comments    Pt agreeable to PT; reports pain in Bilateral lower extremities 3/10. O2 saturation at rest supine is 88% on 5 liters. Encouraged pursed lip breathing without change. Sitting edge of bed performed for hopes of improved O2 saturation; initially decreased to 84%. Pursed lip breathing improves to 88%; however, basic heel/toe and march decreases O2 saturation again to 84-85%. Encouraged slower performance of exercise with slightly increased rest between repetitions and sets as well as focus on breathing. Later half of exercises demonstrates improved management with O2 saturation low, but remaining between 87 and 89%. Deferred stand/ambulation due to low O2 saturation. Continue PT to progress strength, endurance with improved O2 saturation to safe levels. Goals need updated.    Follow Up Recommendations  SNF     Equipment Recommendations  Rolling walker with 5" wheels;3in1 (PT)    Recommendations for Other Services       Precautions / Restrictions Precautions Precautions: Fall Restrictions Weight Bearing Restrictions: No    Mobility  Bed Mobility Overal bed mobility: Modified Independent       Supine to sit: Modified independent (Device/Increase time);HOB elevated Sit to supine: Modified independent (Device/Increase time);HOB elevated   General bed mobility comments: Increased time/effort and use of rails. Decreased O2 sats to 84% requiring rest and pursed lip breathing  to recover to 88%  Transfers                 General transfer comment: Not tested due to inability to attain O2 saturation over 90%  Ambulation/Gait                 Stairs            Wheelchair Mobility    Modified Rankin (Stroke Patients Only)       Balance                                            Cognition Arousal/Alertness: Awake/alert Behavior During Therapy: WFL for tasks assessed/performed Overall Cognitive Status: Within Functional Limits for tasks assessed                                        Exercises General Exercises - Lower Extremity Long Arc Quad: AROM;Both;10 reps;Seated (2 sets with rest in between) Hip ABduction/ADduction: AROM;Both;10 reps;Seated (90/90 position 2 sets with rest in between) Hip Flexion/Marching: AROM;Both;10 reps;Seated (2 sets with rest in between ) Toe Raises: AROM;Both;10 reps;Seated (2 sets with rest in between) Heel Raises: AROM;Both;10 reps;Seated (2 sets with rest in between)    General Comments        Pertinent Vitals/Pain Pain Assessment: 0-10 Pain Score: 3  Pain Location: BLE Pain Intervention(s): Monitored during session    Home Living  Prior Function            PT Goals (current goals can now be found in the care plan section) Progress towards PT goals: Progressing toward goals (slowly)    Frequency    Min 2X/week      PT Plan Current plan remains appropriate    Co-evaluation             End of Session Equipment Utilized During Treatment: Oxygen Activity Tolerance: Other (comment) (low O2 saturation on 5L) Patient left: in bed;with call bell/phone within reach;with family/visitor present;with SCD's reapplied   PT Visit Diagnosis: Muscle weakness (generalized) (M62.81);Unsteadiness on feet (R26.81)     Time: 8916-9450 PT Time Calculation (min) (ACUTE ONLY): 24 min  Charges:  $Therapeutic Exercise: 23-37  mins                    G CodesLarae Grooms, PTA 04/23/2017, 4:39 PM

## 2017-04-23 NOTE — Progress Notes (Signed)
SUBJECTIVE:Pt is feeling well and is anxious to get home.    Vitals:   04/22/17 2058 04/23/17 0443 04/23/17 0500 04/23/17 0546  BP: (!) 84/49 (!) 86/47    Pulse: 79 89  74  Resp: 20 20    Temp: 98.7 F (37.1 C) 98.1 F (36.7 C)    TempSrc: Oral Oral    SpO2: 93% (!) 88%  95%  Weight:   127 lb 8 oz (57.8 kg)   Height:        Intake/Output Summary (Last 24 hours) at 04/23/17 0934 Last data filed at 04/23/17 0500  Gross per 24 hour  Intake              660 ml  Output              951 ml  Net             -291 ml    LABS: Basic Metabolic Panel:  Recent Labs  04/22/17 0600 04/22/17 2122  NA 139 137  K 3.7 3.7  CL 99* 97*  CO2 34* 31  GLUCOSE 93 116*  BUN 35* 35*  CREATININE 0.93 0.99  CALCIUM 9.4 9.1  MG 2.0  --   PHOS 4.5  --    Liver Function Tests:  Recent Labs  04/22/17 2122  AST 22  ALT 12*  ALKPHOS 113  BILITOT 5.1*  PROT 6.9  ALBUMIN 3.6   No results for input(s): LIPASE, AMYLASE in the last 72 hours. CBC: No results for input(s): WBC, NEUTROABS, HGB, HCT, MCV, PLT in the last 72 hours. Cardiac Enzymes: No results for input(s): CKTOTAL, CKMB, CKMBINDEX, TROPONINI in the last 72 hours. BNP: Invalid input(s): POCBNP D-Dimer: No results for input(s): DDIMER in the last 72 hours. Hemoglobin A1C: No results for input(s): HGBA1C in the last 72 hours. Fasting Lipid Panel: No results for input(s): CHOL, HDL, LDLCALC, TRIG, CHOLHDL, LDLDIRECT in the last 72 hours. Thyroid Function Tests: No results for input(s): TSH, T4TOTAL, T3FREE, THYROIDAB in the last 72 hours.  Invalid input(s): FREET3 Anemia Panel: No results for input(s): VITAMINB12, FOLATE, FERRITIN, TIBC, IRON, RETICCTPCT in the last 72 hours.   PHYSICAL EXAM General: Well developed, well nourished, in no acute distress HEENT:  Normocephalic and atramatic Neck:  No JVD.  Lungs: Clear bilaterally to auscultation and percussion. Heart: HRRR . Normal S1 and S2 without gallops or murmurs.   Abdomen: Bowel sounds are positive, abdomen soft and non-tender  Msk:  Back normal, normal gait. Normal strength and tone for age. Extremities: No clubbing, cyanosis or edema.   Neuro: Alert and oriented X 3. Psych:  Good affect, responds appropriately  TELEMETRY: NSR 87bpm  ASSESSMENT AND PLAN: CHF with severe pulmonary HTN. Continue current medication. Ok to discharge pt to rehab facility with follow up at pulmonary HTN clinic and outpatient following of CHF.   Active Problems:   CHF (congestive heart failure) (HCC)   Ascites   Increased liver enzymes   Acute on chronic respiratory failure with hypoxia (HCC)   Moderate COPD (chronic obstructive pulmonary disease) (HCC)   Moderate to severe pulmonary hypertension ([place])   Hb-SS disease without crisis ([place])   Cor pulmonale (chronic) ([place])    [name], NP-C 04/23/2017 9:34 AM

## 2017-04-23 NOTE — Progress Notes (Signed)
[place] [facility] [place] NAME: Shannon Schneider    MR#:  [id number]  DATE OF BIRTH:  1971-01-02  SUBJECTIVE:   Continues to have lower extremity edema and abdominal distention. Improved. Still gets very short of breath on minimal movement. On 5 L oxygen.  REVIEW OF SYSTEMS:    Review of Systems  Constitutional: Negative for fever, chills weight loss HENT: Negative for ear pain, nosebleeds, congestion, facial swelling, rhinorrhea, neck pain, neck stiffness and ear discharge.   Respiratory: Negative for cough, +shortness of breath (slightly better), wheezing  Cardiovascular: Negative for chest pain, palpitations and +++leg swelling.  Gastrointestinal: Negative for heartburn,no  vomiting, diarrhea or consitpation +abdominal distension Genitourinary: Negative for dysuria, urgency, frequency, hematuria Musculoskeletal: Negative for back pain or joint pain Neurological: Negative for dizziness, seizures, syncope, focal weakness,  numbness and headaches.  Hematological: Does not bruise/bleed easily.  Psychiatric/Behavioral: Negative for hallucinations, confusion, dysphoric mood  DRUG ALLERGIES:  No Known Allergies  VITALS:  Blood pressure (!) 86/60, pulse 78, temperature 97.9 F (36.6 C), temperature source Oral, resp. rate 20, height 5' (1.524 m), weight 57.8 kg (127 lb 8 oz), last menstrual period 04/14/2017, SpO2 97 %.  PHYSICAL EXAMINATION:  Constitutional: Appears Chronically ill  HENT: Normocephalic. Marland Kitchen Oropharynx is clear and moist.  Eyes: Conjunctivae and EOM are normal. PERRLA, no scleral icterus.  Neck: Normal ROM. Neck supple. Positive JVD. No tracheal deviation. CVS: RRR, S1/S2 , no gallops, no carotid bruit.  Pulmonary: Effort and breath sounds normal, no stridor, rhonchi, wheezes, rales.  Abdominal: Soft. BS +, positive distension, tenderness, no rebound or guarding.  Musculoskeletal: Normal range of motion. 2+ edema to abdomen and no  tenderness.  Neuro: Alert. CN 2-12 grossly intact. No focal deficits. Skin: Skin is warm and dry. No rash noted. Psychiatric: Normal mood and affect.   LABORATORY PANEL:   CBC  Recent Labs Lab 04/20/17 0513  WBC 10.2  HGB 8.4*  HCT 24.3*  PLT 273   ------------------------------------------------------------------------------------------------------------------  Chemistries   Recent Labs Lab 04/22/17 0600 04/22/17 2122  NA 139 137  K 3.7 3.7  CL 99* 97*  CO2 34* 31  GLUCOSE 93 116*  BUN 35* 35*  CREATININE 0.93 0.99  CALCIUM 9.4 9.1  MG 2.0  --   AST  --  22  ALT  --  12*  ALKPHOS  --  113  BILITOT  --  5.1*   ------------------------------------------------------------------------------------------------------------------  Cardiac Enzymes No results for input(s): TROPONINI in the last 168 hours. ------------------------------------------------------------------------------------------------------------------  RADIOLOGY:  Dg Chest 2 View  Result Date: 04/21/2017 CLINICAL DATA:  46 y/o F; congestive heart failure. History of sickle cell anemia. EXAM: CHEST  2 VIEW COMPARISON:  04/17/2017 chest radiograph. FINDINGS: Stable severe cardiomegaly. Stable right PICC line with tip in lower SVC. Stable small right greater than left pleural effusions and dependent bibasilar opacities probably representing associated atelectasis. Stable enlargement of central pulmonary arteries compatible pulmonary artery hypertension. Increase interstitial opacities. No acute osseous abnormality is evident. IMPRESSION: 1. Increase interstitial opacities probably represents development of interstitial edema. 2. Stable small right greater than left pleural effusions and bibasilar opacities probably representing atelectasis. 3. Stable severe cardiomegaly. Electronically Signed   By: Kristine Garbe M.D.   On: 04/21/2017 17:21   ASSESSMENT AND PLAN:   46 year old female with chronic  hypoxic respiratory failure, chronic diastolic heart failure and severe pulmonary hypertension who presented with acute on chronic hypoxic respiratory failure  * Acute on chronic  hypoxic respiratory failure due to CHF exacerbation and severe pulmonary hypertension Continue nasal cannula at 5 L  * Acute on chronic diastolic heart failure due to severe pulmonary hypertension/cardiogenic shock: was on dopamine in ICU. Continue Sidenafil Ultimately will need to go to for evaluation of severe pulmonary hypertension, Dr Alva Garnet has spoken with Dr Malvin Johns (Pulm HTN clinic) at [facility].  We'll continue diuresis. Her hypertension has been limiting this. Increase Lasix to 3 times a day. Monitor input and output.  * Ascites from cor pulmonale s/p paracentesis 1 L removed on April 17 Continue diuresis  * Elevated troponin from demand ischemia not ACS  * Hyponatremia from CHF  Improving with diuresis  * Chronic anemia Continue to monitor hemoglobin. No need for transfusion at this time.  Management plans discussed with the patient and she is in agreement.  Patient will need skilled nursing facility.  CODE STATUS: FULL  TOTAL TIME TAKING CARE OF THIS PATIENT: 35 minutes.   Hillary Bow R M.D on 04/23/2017 at 12:44 PM  Between 7am to 6pm - Pager - [phone]  After 6pm go to [email or web address] - password [email or web address] [facility]  Office  [phone]  CC: Primary care physician; Danelle Berry, NP  Note: This dictation was prepared with Dragon dictation along with smaller phrase technology. Any transcriptional errors that result from this process are unintentional.

## 2017-04-24 LAB — BASIC METABOLIC PANEL
ANION GAP: 8 (ref 5–15)
BUN: 34 mg/dL — ABNORMAL HIGH (ref 6–20)
CO2: 30 mmol/L (ref 22–32)
Calcium: 9.1 mg/dL (ref 8.9–10.3)
Chloride: 98 mmol/L — ABNORMAL LOW (ref 101–111)
Creatinine, Ser: 0.98 mg/dL (ref 0.44–1.00)
GFR calc non Af Amer: >60 mL/min (ref >60)
GLUCOSE: 105 mg/dL — AB (ref 65–99)
POTASSIUM: 3.9 mmol/L (ref 3.5–5.1)
Sodium: 136 mmol/L (ref 135–145)

## 2017-04-24 MED ORDER — MIDODRINE HCL 2.5 MG PO TABS: 2.5000 mg | ORAL_TABLET | Freq: Three times a day (TID) | ORAL | Status: AC

## 2017-04-24 MED ORDER — METOLAZONE 2.5 MG PO TABS: 5.0000 mg | ORAL_TABLET | Freq: Every day | ORAL | Status: DC

## 2017-04-24 MED ORDER — FUROSEMIDE 40 MG PO TABS: 40.0000 mg | ORAL_TABLET | Freq: Two times a day (BID) | ORAL | 0 refills | Status: AC

## 2017-04-24 MED ORDER — MIDODRINE HCL 5 MG PO TABS
2.5000 mg | ORAL_TABLET | Freq: Three times a day (TID) | ORAL | Status: DC
  Administered 2017-04-24: 2.5 mg via ORAL
  Filled 2017-04-24: qty 1

## 2017-04-24 NOTE — Progress Notes (Signed)
SUBJECTIVE: Patient is feeling much better   Vitals:   04/23/17 2040 04/24/17 0408 04/24/17 0408 04/24/17 0824  BP: (!) 84/57  (!) 87/51 90/64  Pulse: 81  80 75  Resp: 18  20   Temp: 98.1 F (36.7 C)  97.7 F (36.5 C) 97.7 F (36.5 C)  TempSrc: Oral  Oral Oral  SpO2: 92%  92% 95%  Weight:  131 lb (59.4 kg)    Height:        Intake/Output Summary (Last 24 hours) at 04/24/17 0832 Last data filed at 04/24/17 0400  Gross per 24 hour  Intake              980 ml  Output             1050 ml  Net              -70 ml    LABS: Basic Metabolic Panel:  Recent Labs  04/22/17 0600 04/22/17 2122 04/24/17 0402  NA 139 137 136  K 3.7 3.7 3.9  CL 99* 97* 98*  CO2 34* 31 30  GLUCOSE 93 116* 105*  BUN 35* 35* 34*  CREATININE 0.93 0.99 0.98  CALCIUM 9.4 9.1 9.1  MG 2.0  --   --   PHOS 4.5  --   --    Liver Function Tests:  Recent Labs  04/22/17 2122  AST 22  ALT 12*  ALKPHOS 113  BILITOT 5.1*  PROT 6.9  ALBUMIN 3.6   No results for input(s): LIPASE, AMYLASE in the last 72 hours. CBC: No results for input(s): WBC, NEUTROABS, HGB, HCT, MCV, PLT in the last 72 hours. Cardiac Enzymes: No results for input(s): CKTOTAL, CKMB, CKMBINDEX, TROPONINI in the last 72 hours. BNP: Invalid input(s): POCBNP D-Dimer: No results for input(s): DDIMER in the last 72 hours. Hemoglobin A1C: No results for input(s): HGBA1C in the last 72 hours. Fasting Lipid Panel: No results for input(s): CHOL, HDL, LDLCALC, TRIG, CHOLHDL, LDLDIRECT in the last 72 hours. Thyroid Function Tests: No results for input(s): TSH, T4TOTAL, T3FREE, THYROIDAB in the last 72 hours.  Invalid input(s): FREET3 Anemia Panel: No results for input(s): VITAMINB12, FOLATE, FERRITIN, TIBC, IRON, RETICCTPCT in the last 72 hours.   PHYSICAL EXAM General: Well developed, well nourished, in no acute distress HEENT:  Normocephalic and atramatic Neck:  No JVD.  Lungs: Clear bilaterally to auscultation and  percussion. Heart: HRRR . Normal S1 and S2 without gallops or murmurs.  Abdomen: Bowel sounds are positive, abdomen soft and non-tender  Msk:  Back normal, normal gait. Normal strength and tone for age. Extremities: No clubbing, cyanosis or edema.   Neuro: Alert and oriented X 3. Psych:  Good affect, responds appropriately  TELEMETRY:Sinus rhythm  ASSESSMENT AND PLAN: Status post atrial fib flutter with CHF and right-sided failure much better with being sinus rhythm on digoxin and oxygen. Patient is still hypotensive but feels much better than on admission.  Active Problems:   CHF (congestive heart failure) (HCC)   Ascites   Increased liver enzymes   Acute on chronic respiratory failure with hypoxia (HCC)   Moderate COPD (chronic obstructive pulmonary disease) (HCC)   Moderate to severe pulmonary hypertension (HCC)   Hb-SS disease without crisis ([place])   Cor pulmonale (chronic) (HCC)    Shannon Schneider A, MD, [facility] 04/24/2017 8:32 AM

## 2017-04-24 NOTE — Progress Notes (Signed)
Discharged patient to [facility] in stable condition, reviewed discharge instructions and prescriptions with patient.  She was picked up by EMS

## 2017-04-24 NOTE — Clinical Social Work Note (Signed)
Patient discharging today and patient is aware and in agreement. Patient's nurse to make sure patient calls her sister to notify her as well. Tam[facility] at [facility] stated [employer] has authorized patient to come and they can accept today. Discharge information sent to [facility]. Nurse to call report then EMS. Shela Leff MSW,LCSW [phone]

## 2017-04-24 NOTE — Discharge Summary (Signed)
[facility] NAME: Shannon Schneider    MR#:  [id number]  DATE OF BIRTH:  01/17/1971  DATE OF ADMISSION:  04/14/2017 ADMITTING PHYSICIAN: Bettey Costa, MD  DATE OF DISCHARGE: No discharge date for patient encounter.  PRIMARY CARE PHYSICIAN: BOSWELL, CHELSA H, NP   ADMISSION DIAGNOSIS:  Edema [R60.9] Shortness of breath [R06.02] Dyspnea [M35.36] Acute systolic congestive heart failure (HCC) [I50.21] AKI (acute kidney injury) ([place]) [N17.9] Ascites [R18.8] Anemia, unspecified type [D64.9]  DISCHARGE DIAGNOSIS:  Active Problems:   CHF (congestive heart failure) (HCC)   Ascites   Increased liver enzymes   Acute on chronic respiratory failure with hypoxia (HCC)   Moderate COPD (chronic obstructive pulmonary disease) (HCC)   Moderate to severe pulmonary hypertension (HCC)   Hb-SS disease without crisis ([place])   Cor pulmonale (chronic) ([place])   SECONDARY DIAGNOSIS:   Past Medical History:  Diagnosis Date  . Congestive heart failure (CHF) ([place]) 06/27/15  . COPD (chronic obstructive pulmonary disease) ([place]) 06/27/15  . Hyperbilirubinemia   . 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 Rectal mass   . Sickle cell anemia (HCC)      ADMITTING HISTORY  HISTORY OF PRESENT ILLNESS:  Shannon Schneider  is a 46 y.o. female with a known history of chronic diastolic heart failure EF 70% and severe pulmonary HTN/moderate to severe mitral regurgitation and severe tricuspid regurgitation, COPD with chronic 2 L of oxygen who presents with above complaint. Patient reports over the past week she has had increasing lower extremity edema, abdominal girth, shortness of breath, dyspnea exertion, PND and orthopnea. She saw her cardiologist on Friday who increased her diuretic. Her normal blood pressure systolic is around 14E. In the emergency room her systolic blood pressures have been 70s to 80s. She reports she is compliant with her medications and her diet. Since she has been in  the ER she is also had nausea and vomiting. It is noted that she did not follow up with the CHF clinic at the end of March.  Chest x-ray shows increasing right pleural effusion. BNP is elevated.  Bedside echocardiogram was performed in the emergency room which is showing right side of heart not collapsing,but no tamponade, but right atrium/ventricle not moving much.   HOSPITAL COURSE:   46 year old female with chronic hypoxic respiratory failure, chronic diastolic heart failure and severe pulmonary hypertension who presented with acute on chronic hypoxic respiratory failure  * Acute on chronic hypoxic respiratory failure due to CHF exacerbation and severe pulmonary hypertension Continue nasal cannula at 5 L  * Acute on chronic diastolic heart failure with severe pulmonary hypertension/cardiogenic shock was on dopamine in ICU. Started on Sidenafil but stopped due to hypotension. Ultimately will need to go to for evaluation for severe pulmonary hypertension, Dr Alva Garnet has spoken with Dr Malvin Johns (Pulm HTN clinic) at [facility] lasix and Metolazone.  * Chronic hypotension Started midodrine. Her baseline systolic BP is 31V and 40G.  * Ascites from cor pulmonale s/p paracentesis 1 L removed on April 17 Continue diuresis  * Elevated troponin from demand ischemia not ACS  * Hyponatremia from CHF resolved  * Chronic anemia Continue to monitor hemoglobin. No need for transfusion at this time.  Overall poor longterm prognosis. She desats very easily even on 5 Liters O2  Needs f/u with Dr. Khan(cardiology), Dr. Fleming(pulmonary) and Dr. Byrum(Pulmonary hypertension)  Discharge to SNF for PT  CONSULTS OBTAINED:  Treatment Team:  Dionisio David, MD Harpreet Leonidas Romberg,  MD  DRUG ALLERGIES:  No Known Allergies  DISCHARGE MEDICATIONS:   Current Discharge Medication List    START taking these medications   Details  digoxin (LANOXIN) 0.125 MG tablet Take 1  tablet (0.125 mg total) by mouth daily. Qty: 30 tablet, Refills: 0    metoprolol tartrate (LOPRESSOR) 25 MG tablet Take 0.5 tablets (12.5 mg total) by mouth 2 (two) times daily. Qty: 30 tablet, Refills: 0    midodrine (PROAMATINE) 2.5 MG tablet Take 1 tablet (2.5 mg total) by mouth 3 (three) times daily with meals.    oxyCODONE 10 MG TABS Take 1 tablet (10 mg total) by mouth every 6 (six) hours as needed for moderate pain or severe pain (pain). Qty: 20 tablet, Refills: 0      CONTINUE these medications which have CHANGED   Details  furosemide (LASIX) 40 MG tablet Take 1 tablet (40 mg total) by mouth 2 (two) times daily. Qty: 30 tablet, Refills: 0    metolazone (ZAROXOLYN) 2.5 MG tablet Take 2 tablets (5 mg total) by mouth daily.      CONTINUE these medications which have NOT CHANGED   Details  albuterol (PROVENTIL HFA;VENTOLIN HFA) 108 (90 Base) MCG/ACT inhaler Inhale 2 puffs into the lungs every 6 (six) hours as needed for wheezing.    citalopram (CELEXA) 10 MG tablet Take 10 mg by mouth at bedtime.    Ergocalciferol 2000 units TABS Take 2,000 Units by mouth daily.     loratadine (CLARITIN) 10 MG tablet Take 10 mg by mouth daily.    mometasone-formoterol (DULERA) 200-5 MCG/ACT AERO Inhale 2 puffs into the lungs 2 (two) times daily. Qty: 1 Inhaler, Refills: 0    rivaroxaban (XARELTO) 20 MG TABS tablet Take 1 tablet (20 mg total) by mouth daily. Qty: 30 tablet, Refills: 0    spironolactone (ALDACTONE) 25 MG tablet Take 25 mg by mouth daily.    Tiotropium Bromide Monohydrate (SPIRIVA RESPIMAT) 2.5 MCG/ACT AERS Inhale 1 puff into the lungs 2 (two) times daily.    OXYGEN Inhale 2 L into the lungs continuous.       STOP taking these medications     metoprolol succinate (TOPROL-XL) 100 MG 24 hr tablet         Today   VITAL SIGNS:  Blood pressure 90/64, pulse 75, temperature 97.7 F (36.5 C), temperature source Oral, resp. rate 18, height 5' (1.524 m), weight 59.4 kg  (131 lb), last menstrual period 04/14/2017, SpO2 95 %.  I/O:   Intake/Output Summary (Last 24 hours) at 04/24/17 1107 Last data filed at 04/24/17 0900  Gross per 24 hour  Intake              840 ml  Output              700 ml  Net              140 ml    PHYSICAL EXAMINATION:  Physical Exam  GENERAL:  46 y.o.-year-old patient lying in the bed with no acute distress.  LUNGS: Normal breath sounds bilaterally, no wheezing, rales,rhonchi or crepitation. No use of accessory muscles of respiration.  CARDIOVASCULAR: S1, S2 normal. No murmurs, rubs, or gallops. LE edema ABDOMEN: Soft, non-tender, mild distention NEUROLOGIC: Moves all 4 extremities. PSYCHIATRIC: The patient is alert and oriented x 3.  SKIN: No obvious rash, lesion, or ulcer.   DATA REVIEW:   CBC  Recent Labs Lab 04/20/17 0513  WBC 10.2  HGB 8.4*  HCT  24.3*  PLT 273    Chemistries   Recent Labs Lab 04/22/17 0600 04/22/17 2122 04/24/17 0402  NA 139 137 136  K 3.7 3.7 3.9  CL 99* 97* 98*  CO2 34* 31 30  GLUCOSE 93 116* 105*  BUN 35* 35* 34*  CREATININE 0.93 0.99 0.98  CALCIUM 9.4 9.1 9.1  MG 2.0  --   --   AST  --  22  --   ALT  --  12*  --   ALKPHOS  --  113  --   BILITOT  --  5.1*  --     Cardiac Enzymes No results for input(s): TROPONINI in the last 168 hours.  Microbiology Results  Results for orders placed or performed during the hospital encounter of 04/14/17  MRSA PCR Screening     Status: None   Collection Time: 04/14/17  9:28 PM  Result Value Ref Range Status   MRSA by PCR NEGATIVE NEGATIVE Final    Comment:        The GeneXpert MRSA Assay (FDA approved for NASAL specimens only), is one component of a comprehensive MRSA colonization surveillance program. It is not intended to diagnose MRSA infection nor to guide or monitor treatment for MRSA infections.   CULTURE, BLOOD (ROUTINE X 2) w Reflex to ID Panel     Status: None   Collection Time: 04/14/17 10:37 PM  Result Value Ref  Range Status   Specimen Description BLOOD RIGHT WRIST  Final   Special Requests   Final    BOTTLES DRAWN AEROBIC AND ANAEROBIC Blood Culture adequate volume   Culture NO GROWTH 5 DAYS  Final   Report Status 04/19/2017 FINAL  Final  CULTURE, BLOOD (ROUTINE X 2) w Reflex to ID Panel     Status: None   Collection Time: 04/14/17 10:47 PM  Result Value Ref Range Status   Specimen Description BLOOD LEFT WRIST  Final   Special Requests   Final    BOTTLES DRAWN AEROBIC AND ANAEROBIC Blood Culture adequate volume   Culture NO GROWTH 5 DAYS  Final   Report Status 04/19/2017 FINAL  Final  Urine culture     Status: None   Collection Time: 04/14/17 11:40 PM  Result Value Ref Range Status   Specimen Description URINE, RANDOM  Final   Special Requests NONE  Final   Culture   Final    NO GROWTH Performed at [facility], [place] [address], [place], [place] [address]    Report Status 04/16/2017 FINAL  Final  Body fluid culture     Status: None   Collection Time: 04/15/17 11:20 AM  Result Value Ref Range Status   Specimen Description PERITONEAL  Final   Special Requests NONE  Final   Gram Stain   Final    RARE WBC PRESENT,BOTH PMN AND MONONUCLEAR NO ORGANISMS SEEN    Culture   Final    NO GROWTH 3 DAYS Performed at [facility], [place] [address], [place], [place] [address]    Report Status 04/18/2017 FINAL  Final    RADIOLOGY:  No results found.  Follow up with PCP in 1 week.  Management plans discussed with the patient, family and they are in agreement.  CODE STATUS:     Code Status Orders        Start     Ordered   04/14/17 2046  Full code  Continuous     04/14/17 2045    Code  Status History    Date Active Date Inactive Code Status Order ID Comments User Context   03/12/2017  7:32 PM 03/17/2017  7:35 PM Full Code [id number]  Gladstone Lighter, MD Inpatient   12/30/2016  4:15 PM 01/02/2017  7:09 PM Full Code [id number]  Hillary Bow, MD ED   06/27/2015  1:31 PM 06/29/2015   5:59 PM Full Code [id number]  Henreitta Leber, MD Inpatient      TOTAL TIME TAKING CARE OF THIS PATIENT ON DAY OF DISCHARGE: more than 30 minutes.   Hillary Bow R M.D on 04/24/2017 at 11:07 AM  Between 7am to 6pm - Pager - [phone]  After 6pm go to [email or web address] - password [email or web address] [facility]  Office  [phone]  CC: Primary care physician; Danelle Berry, NP  Note: This dictation was prepared with Dragon dictation along with smaller phrase technology. Any transcriptional errors that result from this process are unintentional.

## 2017-04-24 NOTE — Progress Notes (Signed)
MD notified of 1 sec pauses on telemetry. Pauses are frequent but very short. (about a second) pt asymptomatic and sleeping will continue to monitor.

## 2017-04-24 NOTE — Clinical Social Work Placement (Signed)
   CLINICAL SOCIAL WORK PLACEMENT  NOTE  Date:  04/24/2017  Patient Details  Name: Shannon Schneider MRN: [id number] Date of Birth: 05/22/1971  Clinical Social Work is seeking post-discharge placement for this patient at the [place] level of care (*CSW will initial, date and re-position this form in  chart as items are completed):  Yes   Patient/family provided with [facility] Work Department's list of facilities offering this level of care within the geographic area requested by the patient (or if unable, by the patient's family).  Yes   Patient/family informed of their freedom to choose among providers that offer the needed level of care, that participate in Medicare, Medicaid or managed care program needed by the patient, have an available bed and are willing to accept the patient.  Yes   Patient/family informed of [facility]'s ownership interest in [facility] and [facility], as well as of the fact that they are under no obligation to receive care at these facilities.  PASRR submitted to EDS on       PASRR number received on       Existing PASRR number confirmed on 04/20/17     FL2 transmitted to all facilities in geographic area requested by pt/family on 04/20/17     FL2 transmitted to all facilities within larger geographic area on       Patient informed that his/her managed care company has contracts with or will negotiate with certain facilities, including the following:        Yes   Patient/family informed of bed offers received.  Patient chooses bed at  ([facility])     Physician recommends and patient chooses bed at  [facility])    Patient to be transferred to  ([facility]) on 04/24/17.  Patient to be transferred to facility by  ([facility])     Patient family notified on 04/24/17 of transfer.  Name of family member notified:  Pateint to call her sister to notify     PHYSICIAN       Additional Comment:     _______________________________________________ Shela Leff, LCSW 04/24/2017, 11:21 AM

## 2017-04-24 NOTE — Progress Notes (Signed)
Called report to [facility] and gave report to LPN Maudie Mercury.  Removed the right upper arm PICC line per order

## 2017-04-29 ENCOUNTER — Other Ambulatory Visit
Admission: RE | Admit: 2017-04-29 | Discharge: 2017-04-29 | Disposition: A | Discharge status: Home / Self Care | Payer: BLUE CROSS/BLUE SHIELD | Source: Ambulatory Visit | Attending: Family Medicine | Admitting: Family Medicine

## 2017-04-29 DIAGNOSIS — I509 Heart failure, unspecified: Secondary | ICD-10-CM | POA: Diagnosis present

## 2017-04-29 LAB — BRAIN NATRIURETIC PEPTIDE: B NATRIURETIC PEPTIDE 5: 1757 pg/mL — AB (ref 0.0–100.0)

## 2017-05-01 ENCOUNTER — Encounter: Payer: Self-pay | Admitting: Adult Health

## 2017-05-01 ENCOUNTER — Other Ambulatory Visit (INDEPENDENT_AMBULATORY_CARE_PROVIDER_SITE_OTHER): Payer: BLUE CROSS/BLUE SHIELD

## 2017-05-01 ENCOUNTER — Ambulatory Visit (INDEPENDENT_AMBULATORY_CARE_PROVIDER_SITE_OTHER): Payer: BLUE CROSS/BLUE SHIELD | Admitting: Adult Health

## 2017-05-01 VITALS — BP 118/64 | HR 64 | Ht 60.0 in | Wt 139.2 lb

## 2017-05-01 DIAGNOSIS — I272 Pulmonary hypertension, unspecified: Secondary | ICD-10-CM

## 2017-05-01 DIAGNOSIS — J449 Chronic obstructive pulmonary disease, unspecified: Secondary | ICD-10-CM | POA: Diagnosis not present

## 2017-05-01 DIAGNOSIS — I509 Heart failure, unspecified: Secondary | ICD-10-CM

## 2017-05-01 LAB — BASIC METABOLIC PANEL
BUN: 27 mg/dL — AB (ref 6–23)
CHLORIDE: 95 meq/L — AB (ref 96–112)
CO2: 35 mEq/L — ABNORMAL HIGH (ref 19–32)
CREATININE: 1.03 mg/dL (ref 0.40–1.20)
Calcium: 9.7 mg/dL (ref 8.4–10.5)
GFR: 74.03 mL/min (ref >60.00)
GLUCOSE: 107 mg/dL — AB (ref 70–99)
POTASSIUM: 4 meq/L (ref 3.5–5.1)
Sodium: 137 mEq/L (ref 135–145)

## 2017-05-01 LAB — SEDIMENTATION RATE: Sed Rate: 1 mm/hr (ref 0–20)

## 2017-05-01 NOTE — Addendum Note (Signed)
Addended by: Parke Poisson E on: 05/01/2017 05:26 PM   Modules accepted: Orders

## 2017-05-01 NOTE — Progress Notes (Signed)
@Patient  ID: Shannon Schneider, female    DOB: 05/11/1971, 46 y.o.   MRN: [id number]  Chief Complaint  Patient presents with  . Follow-up    COPD     Referring provider: Danelle Berry, NP  HPI: 46 yo female former smoker -(04/14/17) seen for PCCM consult 04/14/17 at [facility] for acute resp failure with CHF exacerbation .  Has sickle cell anemia , COPD on O2 home .  Cardiomyopathy  severe pulmonary HTN and severe MR/TR. -F/by Dr. Humphrey Rolls in [place] .   TEST  03/2015 Echo >EF 35-40%, Mod MV regur . 03/2016 Echo EF 75%, sever TV regurg. Mod MV regurg, PAP 74mHg.  04/14/17 >ven dopplers >neg for DVT.   05/01/2017 Post hospital follow up : COPD  Pt returns for a post hospital follow up . Admitted last month for CHF exacerbation . Patient was seen by pulmonary during her hospitalization. Patient has severe pulmonary hypertension. She was tried on sidenafil but unable to tolerate due to Hypotension . Chest x-ray showed stable right greater than left pleural effusions. Increased interstitial edema. She remains on Lasix 821m, Aldactone 2574mand metolazone 5mg70mily . She is on midodrine for hypotension . That has helped. She remains on xarelto. She has ascites , had 1 L removed during hospital stay .  Patient has COPD is on Dulera and Spiriva. He was on home oxygen at 2 L. However, required increased oxygen at 5 L at discharge. Follows with Dr. FlemRaul Delpulmonary . Was told lung function was 40% (sometime in 2017)  She has stopped smoking since discharge.  Discharge to rehab. She is feeling better with less dyspnea. Leg swelling is much better . Swelling in stomach ( ascites) is better.  Denies chest pain , orthopnea  Or hemoptysis     No Known Allergies  Immunization History  Administered Date(s) Administered  . Influenza Split 09/29/2016    Past Medical History:  Diagnosis Date  . Congestive heart failure (CHF) (HCC)[place]28/16  . COPD (chronic obstructive pulmonary disease) (HCC)[place]/28/16  .  Hyperbilirubinemia   . 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 Rectal mass   . Sickle cell anemia (HCC)     Tobacco History: History  Smoking Status  . Former Smoker  . Packs/day: 0.25  . Years: 20.00  . Types: Cigarettes  Smokeless Tobacco  . Never Used   Counseling given: Not Answered   Outpatient Encounter Prescriptions as of 05/01/2017  Medication Sig  . albuterol (PROVENTIL HFA;VENTOLIN HFA) 108 (90 Base) MCG/ACT inhaler Inhale 2 puffs into the lungs every 6 (six) hours as needed for wheezing.  . citalopram (CELEXA) 10 MG tablet Take 10 mg by mouth at bedtime.  . digoxin (LANOXIN) 0.125 MG tablet Take 1 tablet (0.125 mg total) by mouth daily.  . Ergocalciferol 2000 units TABS Take 2,000 Units by mouth daily.   . furosemide (LASIX) 40 MG tablet Take 1 tablet (40 mg total) by mouth 2 (two) times daily.  . loMarland Kitchenatadine (CLARITIN) 10 MG tablet Take 10 mg by mouth daily.  . metolazone (ZAROXOLYN) 2.5 MG tablet Take 2 tablets (5 mg total) by mouth daily.  . metoprolol tartrate (LOPRESSOR) 25 MG tablet Take 0.5 tablets (12.5 mg total) by mouth 2 (two) times daily.  . midodrine (PROAMATINE) 2.5 MG tablet Take 1 tablet (2.5 mg total) by mouth 3 (three) times daily with meals.  . mometasone-formoterol (DULERA) 200-5 MCG/ACT AERO Inhale 2 puffs into the lungs 2 (two) times daily.  .Marland Kitchen  oxyCODONE 10 MG TABS Take 1 tablet (10 mg total) by mouth every 6 (six) hours as needed for moderate pain or severe pain (pain).  . OXYGEN Inhale 2 L into the lungs continuous.   . rivaroxaban (XARELTO) 20 MG TABS tablet Take 1 tablet (20 mg total) by mouth daily.  Marland Kitchen spironolactone (ALDACTONE) 25 MG tablet Take 25 mg by mouth daily.  . Tiotropium Bromide Monohydrate (SPIRIVA RESPIMAT) 2.5 MCG/ACT AERS Inhale 1 puff into the lungs 2 (two) times daily.   No facility-administered encounter medications on file as of 05/01/2017.      Review of Systems  Constitutional:   No  weight loss, night sweats,  Fevers, chills, +  fatigue, or  lassitude.  HEENT:   No headaches,  Difficulty swallowing,  Tooth/dental problems, or  Sore throat,                No sneezing, itching, ear ache, nasal congestion, post nasal drip,   CV:  No chest pain,  Orthopnea, PND,    dizziness, palpitations, syncope.   GI  No heartburn, indigestion, abdominal pain, nausea, vomiting, diarrhea, change in bowel habits, loss of appetite, bloody stools.   Resp: .  No chest wall deformity  Skin: no rash or lesions.  GU: no dysuria, change in color of urine, no urgency or frequency.  No flank pain, no hematuria   MS:  No joint pain or swelling.  No decreased range of motion.  No back pain.    Physical Exam  BP 118/64 (BP Location: Left Arm, Cuff Size: Small)   Pulse 64   Ht 5' (1.524 m)   Wt 139 lb 3.2 oz (63.1 kg)   LMP 04/14/2017   SpO2 93%   BMI 27.19 kg/m   GEN: A/Ox3; pleasant , NAD, chronically ill-appearing , in wheelchair on oxygen   HEENT:  [place]/AT,  EACs-clear, TMs-wnl, NOSE-clear, THROAT-clear, no lesions, no postnasal drip or exudate noted.   NECK:  Supple w/ fair ROM; no JVD; normal carotid impulses w/o bruits; no thyromegaly or nodules palpated; no lymphadenopathy.    RESP  Decreased BS in bases  accessory muscle use, no dullness to percussion  CARD:  RRR, no m/r/g, 2+ peripheral edema, pulses intact, no cyanosis or clubbing.  GI:   Soft & nt; nml bowel sounds; no organomegaly or masses detected.   Musco: Warm bil, no deformities or joint swelling noted.   Neuro: alert, no focal deficits noted.    Skin: Warm, no lesions or rashes    Lab Results:  CBC    Component Value Date/Time   WBC 10.2 04/20/2017 0513   RBC 2.21 (L) 04/20/2017 0513   HGB 8.4 (L) 04/20/2017 0513   HGB 14.9 09/26/2014 2038   HCT 24.3 (L) 04/20/2017 0513   HCT 45.1 09/26/2014 2038   PLT 273 04/20/2017 0513   PLT 449 (H) 09/26/2014 2038   MCV 110.1 (H) 04/20/2017 0513   MCV 116 (H) 09/26/2014 2038   MCH 38.0 (H) 04/20/2017 0513    MCHC 34.6 04/20/2017 0513   RDW 21.4 (H) 04/20/2017 0513   RDW 16.7 (H) 09/26/2014 2038   LYMPHSABS 2.2 04/20/2017 0513   MONOABS 0.7 04/20/2017 0513   EOSABS 0.1 04/20/2017 0513   BASOSABS 0.0 04/20/2017 0513    BMET    Component Value Date/Time   NA 136 04/24/2017 0402   NA 142 09/26/2014 2038   K 3.9 04/24/2017 0402   K 3.7 09/26/2014 2038   CL 98 (  L) 04/24/2017 0402   CL 109 (H) 09/26/2014 2038   CO2 30 04/24/2017 0402   CO2 24 09/26/2014 2038   GLUCOSE 105 (H) 04/24/2017 0402   GLUCOSE 89 09/26/2014 2038   BUN 34 (H) 04/24/2017 0402   BUN 5 (L) 09/26/2014 2038   CREATININE 0.98 04/24/2017 0402   CREATININE 0.56 (L) 09/26/2014 2038   CALCIUM 9.1 04/24/2017 0402   CALCIUM 8.8 09/26/2014 2038   GFRNONAA >60 04/24/2017 0402   GFRNONAA >60 09/26/2014 2038   GFRNONAA >60 06/09/2012 1104   GFRAA >60 04/24/2017 0402   GFRAA >60 09/26/2014 2038   GFRAA >60 06/09/2012 1104    BNP    Component Value Date/Time   BNP 1,757.0 (H) 04/29/2017 0735    ProBNP No results found for: PROBNP  Imaging: Dg Chest 1 View  Result Date: 04/14/2017 CLINICAL DATA:  PICC line placement. EXAM: CHEST 1 VIEW COMPARISON:  Chest radiograph April 14, 2017 at 1616 hours FINDINGS: RIGHT PICC distal tip projects an proximal to mid superior vena cava. Stable cardiomegaly. Mediastinal silhouette is nonsuspicious. Small, decreased RIGHT pleural effusion with underlying airspace opacity. No pneumothorax. Soft tissue planes and included osseous structures are nonsuspicious. IMPRESSION: RIGHT PICC distal tip projects in proximal to mid superior vena cava, no pneumothorax. Stable cardiomegaly, superimposed pericardial effusion is possible. Small residual RIGHT pleural effusion with underlying airspace opacity. Electronically Signed   By: Elon Alas M.D.   On: 04/14/2017 20:54   Dg Chest 2 View  Result Date: 04/21/2017 CLINICAL DATA:  46 y/o F; congestive heart failure. History of sickle cell  anemia. EXAM: CHEST  2 VIEW COMPARISON:  04/17/2017 chest radiograph. FINDINGS: Stable severe cardiomegaly. Stable right PICC line with tip in lower SVC. Stable small right greater than left pleural effusions and dependent bibasilar opacities probably representing associated atelectasis. Stable enlargement of central pulmonary arteries compatible pulmonary artery hypertension. Increase interstitial opacities. No acute osseous abnormality is evident. IMPRESSION: 1. Increase interstitial opacities probably represents development of interstitial edema. 2. Stable small right greater than left pleural effusions and bibasilar opacities probably representing atelectasis. 3. Stable severe cardiomegaly. Electronically Signed   By: Kristine Garbe M.D.   On: 04/21/2017 17:21   US Abdomen Limited  Result Date: 04/15/2017 CLINICAL DATA:  Shortness of breath and ascites. EXAM: LIMITED ABDOMEN ULTRASOUND FOR ASCITES TECHNIQUE: Limited ultrasound survey for ascites was performed in all four abdominal quadrants. COMPARISON:  03/15/2017 FINDINGS: Moderate amount of ascites in the right abdomen. Moderate amount of perihepatic fluid. Difficult to exclude hepatic nodularity and cirrhosis. Small amount of ascites in the left abdomen. IMPRESSION: Moderate amount of ascites, particularly in the right abdomen. Electronically Signed   By: Markus Daft M.D.   On: 04/15/2017 16:07   US Venous Img Lower Bilateral  Result Date: 04/14/2017 CLINICAL DATA:  Bilateral lower extremity edema for 1 month. EXAM: BILATERAL LOWER EXTREMITY VENOUS DOPPLER ULTRASOUND TECHNIQUE: Gray-scale sonography with graded compression, as well as color Doppler and duplex ultrasound were performed to evaluate the lower extremity deep venous systems from the level of the common femoral vein and including the common femoral, femoral, profunda femoral, popliteal and calf veins including the posterior tibial, peroneal and gastrocnemius veins when visible.  The superficial great saphenous vein was also interrogated. Spectral Doppler was utilized to evaluate flow at rest and with distal augmentation maneuvers in the common femoral, femoral and popliteal veins. COMPARISON:  None. FINDINGS: RIGHT LOWER EXTREMITY Common Femoral Vein: No evidence of thrombus. Normal compressibility, respiratory phasicity  and response to augmentation. Saphenofemoral Junction: No evidence of thrombus. Normal compressibility and flow on color Doppler imaging. Profunda Femoral Vein: No evidence of thrombus. Normal compressibility and flow on color Doppler imaging. Femoral Vein: No evidence of thrombus. Normal compressibility, respiratory phasicity and response to augmentation. Popliteal Vein: No evidence of thrombus. Normal compressibility, respiratory phasicity and response to augmentation. Calf Veins: No evidence of thrombus. Normal compressibility and flow on color Doppler imaging. Superficial Great Saphenous Vein: No evidence of thrombus. Normal compressibility and flow on color Doppler imaging. Venous Reflux:  None. Other Findings: Ill-defined edema within the subcutaneous soft tissues, most prominent at the level of the calf LEFT LOWER EXTREMITY Common Femoral Vein: No evidence of thrombus. Normal compressibility, respiratory phasicity and response to augmentation. Saphenofemoral Junction: No evidence of thrombus. Normal compressibility and flow on color Doppler imaging. Profunda Femoral Vein: No evidence of thrombus. Normal compressibility and flow on color Doppler imaging. Femoral Vein: No evidence of thrombus. Normal compressibility, respiratory phasicity and response to augmentation. Popliteal Vein: No evidence of thrombus. Normal compressibility, respiratory phasicity and response to augmentation. Calf Veins: No evidence of thrombus. Normal compressibility and flow on color Doppler imaging. Superficial Great Saphenous Vein: No evidence of thrombus. Normal compressibility and flow on  color Doppler imaging. Venous Reflux:  None. Other Findings: Ill-defined edema within the subcutaneous soft tissues, most prominent at the level of the calf. IMPRESSION: 1. No evidence of DVT, bilateral lower extremities. 2. Ill-defined edema within the subcutaneous soft tissues of both lower extremities, most prominent at the level of the calves. Electronically Signed   By: Franki Cabot M.D.   On: 04/14/2017 19:58   US Paracentesis  Result Date: 04/15/2017 INDICATION: 46 year old with ascites.  Request for a diagnostic paracentesis. EXAM: ULTRASOUND GUIDED PARACENTESIS MEDICATIONS: None. COMPLICATIONS: None immediate. PROCEDURE: Informed written consent was obtained from the patient after a discussion of the risks, benefits and alternatives to treatment. A timeout was performed prior to the initiation of the procedure. Initial ultrasound scanning demonstrates a large amount of ascites within the right lower abdominal quadrant. The right lower abdomen was prepped and draped in the usual sterile fashion. 1% lidocaine was used for local anesthesia. Following this, a 19 gauge, 10-cm, Yueh catheter was introduced. An ultrasound image was saved for documentation purposes. The paracentesis was performed. The catheter was removed and a dressing was applied. The patient tolerated the procedure well without immediate post procedural complication. FINDINGS: A total of approximately 1 L of yellow fluid was removed. Samples were sent to the laboratory as requested by the clinical team. IMPRESSION: Successful ultrasound-guided paracentesis yielding 1 L of peritoneal fluid. Electronically Signed   By: Markus Daft M.D.   On: 04/15/2017 13:12   Dg Chest Port 1 View  Result Date: 04/17/2017 CLINICAL DATA:  Respiratory failure. EXAM: PORTABLE CHEST 1 VIEW COMPARISON:  04/15/2017 . FINDINGS: Right PICC line stable position. Stable severe cardiomegaly. Persistent low lung volumes with persistent consolidation right lower lobe.  Persistent small right pleural effusion. No interim change. IMPRESSION: 1. Right PICC line stable position. 2. Persistent right lower lobe consolidation and small right pleural effusion. No change. 3. Stable severe cardiomegaly. Electronically Signed   By: Marcello Moores  Register   On: 04/17/2017 06:54   Dg Chest Port 1 View  Result Date: 04/15/2017 CLINICAL DATA:  Acute respiratory failure. EXAM: PORTABLE CHEST 1 VIEW COMPARISON:  04/14/2017 FINDINGS: Shallow inspiration. Diffuse cardiac enlargement. Pulmonary vascularity is normal. Small right pleural effusion. Atelectasis or consolidation in the right  lung base. Left lung appears clear and expanded. Right PICC line with tip over the low SVC region. No pneumothorax. IMPRESSION: Cardiac enlargement. Small right pleural effusion with basilar atelectasis or consolidation. Electronically Signed   By: Lucienne Capers M.D.   On: 04/15/2017 05:36   Dg Chest Portable 1 View  Result Date: 04/14/2017 CLINICAL DATA:  Shortness of breath.  Lower extremity swelling. EXAM: PORTABLE CHEST 1 VIEW COMPARISON:  03/12/2017. FINDINGS: Marked enlargement of cardiac silhouette. Vascular congestion is improved. Increasing RIGHT pleural effusion. No consolidation. IMPRESSION: Improved CHF, but increasing RIGHT pleural effusion. Marked enlargement cardiac silhouette is stable. Electronically Signed   By: Staci Righter M.D.   On: 04/14/2017 16:51     Assessment & Plan:   Moderate to severe pulmonary hypertension (HCC) Pt has underlying Severe COPD , Sickle cell , Cor pulmonale , and CM w/ severe TV regurg. And O2 RF  Doubt autoimmune -howevere will check ESR /ANA and RA factor  Doubt OSA -denies snoring or daytime sleepiness. -can consider sleep study if needed. Once she is home consider ONO on 5l/m .  Will have her return for PFT , 6 min walk test .  Keep sats >90% .  Given her age and dz severity may need referral to [facility] , [facility] .   Plan  Patient Instructions    Continue on Dulera and Spiriva  Continue on Oxygen 5l/m - keep oxygen sats >90%.  Labs today .  Follow up with Dr. Lamonte Sakai in 6 weeks with PFT and 6 min walk  Please contact office for sooner follow up if symptoms do not improve or worsen or seek emergency care       Moderate COPD (chronic obstructive pulmonary disease) ([place]) Cont on current regimen  Check PFT on return , depending resutls may need to check HRCT chest   CHF (congestive heart failure) ([place]) CHF/CM - appears improved on current regimen  Cont w/ follow up with cards.      Rexene Edison, NP 05/01/2017

## 2017-05-01 NOTE — Assessment & Plan Note (Signed)
Cont on current regimen  Check PFT on return , depending resutls may need to check HRCT chest

## 2017-05-01 NOTE — Assessment & Plan Note (Signed)
Pt has underlying Severe COPD , Sickle cell , Cor pulmonale , and CM w/ severe TV regurg. And O2 RF  Doubt autoimmune -howevere will check ESR /ANA and RA factor  Doubt OSA -denies snoring or daytime sleepiness. -can consider sleep study if needed. Once she is home consider ONO on 5l/m .  Will have her return for PFT , 6 min walk test .  Keep sats >90% .  Given her age and dz severity may need referral to [facility] , [facility] .   Plan  Patient Instructions  Continue on Dulera and Spiriva  Continue on Oxygen 5l/m - keep oxygen sats >90%.  Labs today .  Follow up with Dr. Lamonte Sakai in 6 weeks with PFT and 6 min walk  Please contact office for sooner follow up if symptoms do not improve or worsen or seek emergency care

## 2017-05-01 NOTE — Patient Instructions (Addendum)
Continue on Dulera and Spiriva  Continue on Oxygen 5l/m - keep oxygen sats >90%.  Labs today .  Follow up with Dr. Lamonte Sakai in 6 weeks with PFT and 6 min walk  Please contact office for sooner follow up if symptoms do not improve or worsen or seek emergency care

## 2017-05-01 NOTE — Assessment & Plan Note (Signed)
CHF/CM - appears improved on current regimen  Cont w/ follow up with cards.

## 2017-05-02 LAB — RHEUMATOID FACTOR

## 2017-05-02 LAB — PATHOLOGIST SMEAR REVIEW

## 2017-05-02 LAB — ANA: ANA: NEGATIVE

## 2017-05-05 ENCOUNTER — Other Ambulatory Visit
Admission: RE | Admit: 2017-05-05 | Discharge: 2017-05-05 | Disposition: A | Discharge status: Home / Self Care | Payer: BLUE CROSS/BLUE SHIELD | Source: Ambulatory Visit | Attending: Family Medicine | Admitting: Family Medicine

## 2017-05-05 DIAGNOSIS — R0602 Shortness of breath: Secondary | ICD-10-CM | POA: Diagnosis present

## 2017-05-05 LAB — BRAIN NATRIURETIC PEPTIDE: B NATRIURETIC PEPTIDE 5: 1430 pg/mL — AB (ref 0.0–100.0)

## 2017-05-06 NOTE — Progress Notes (Signed)
Spoke with patient and informed her of results and to continue ov recs. Pt verbalized understanding and did not have any questions. Nothing further is needed.

## 2017-05-14 ENCOUNTER — Encounter: Payer: Self-pay | Admitting: General Surgery

## 2017-05-20 ENCOUNTER — Telehealth: Payer: Self-pay

## 2017-05-20 NOTE — Telephone Encounter (Signed)
Notified patient as instructed, patient pleased. The patient is scheduled to see Dr Donia Ast at [facility] on 05/28/17 at 10:00 am.

## 2017-05-20 NOTE — Telephone Encounter (Signed)
-----   Message from Robert Bellow, MD sent at 05/17/2017  8:35 AM EDT -----  Shannon Schneider notify the patient that I reviewed the CT scans and there's been little change, perhaps three-eighths of an inch over the last 2 years in the size of this lesion. I would like her to be evaluated at [facility] with Donia Ast M.D.  The colon rectal specialist. Be sure we send a copy or reference to the endorectal ultrasound completed by Dr. Mont Dutton from [place]. ----- Message ----- From: Shanon Ace, CMA Sent: 05/14/2017   2:10 PM To: Robert Bellow, MD

## 2017-06-06 IMAGING — DX DG CHEST 1V PORT
1 series · 1 of 1 positions shown · non-contrast
Comparison: 04/15/2017 .

CLINICAL DATA: Respiratory failure.

EXAM:
PORTABLE CHEST 1 VIEW

[chest ap]
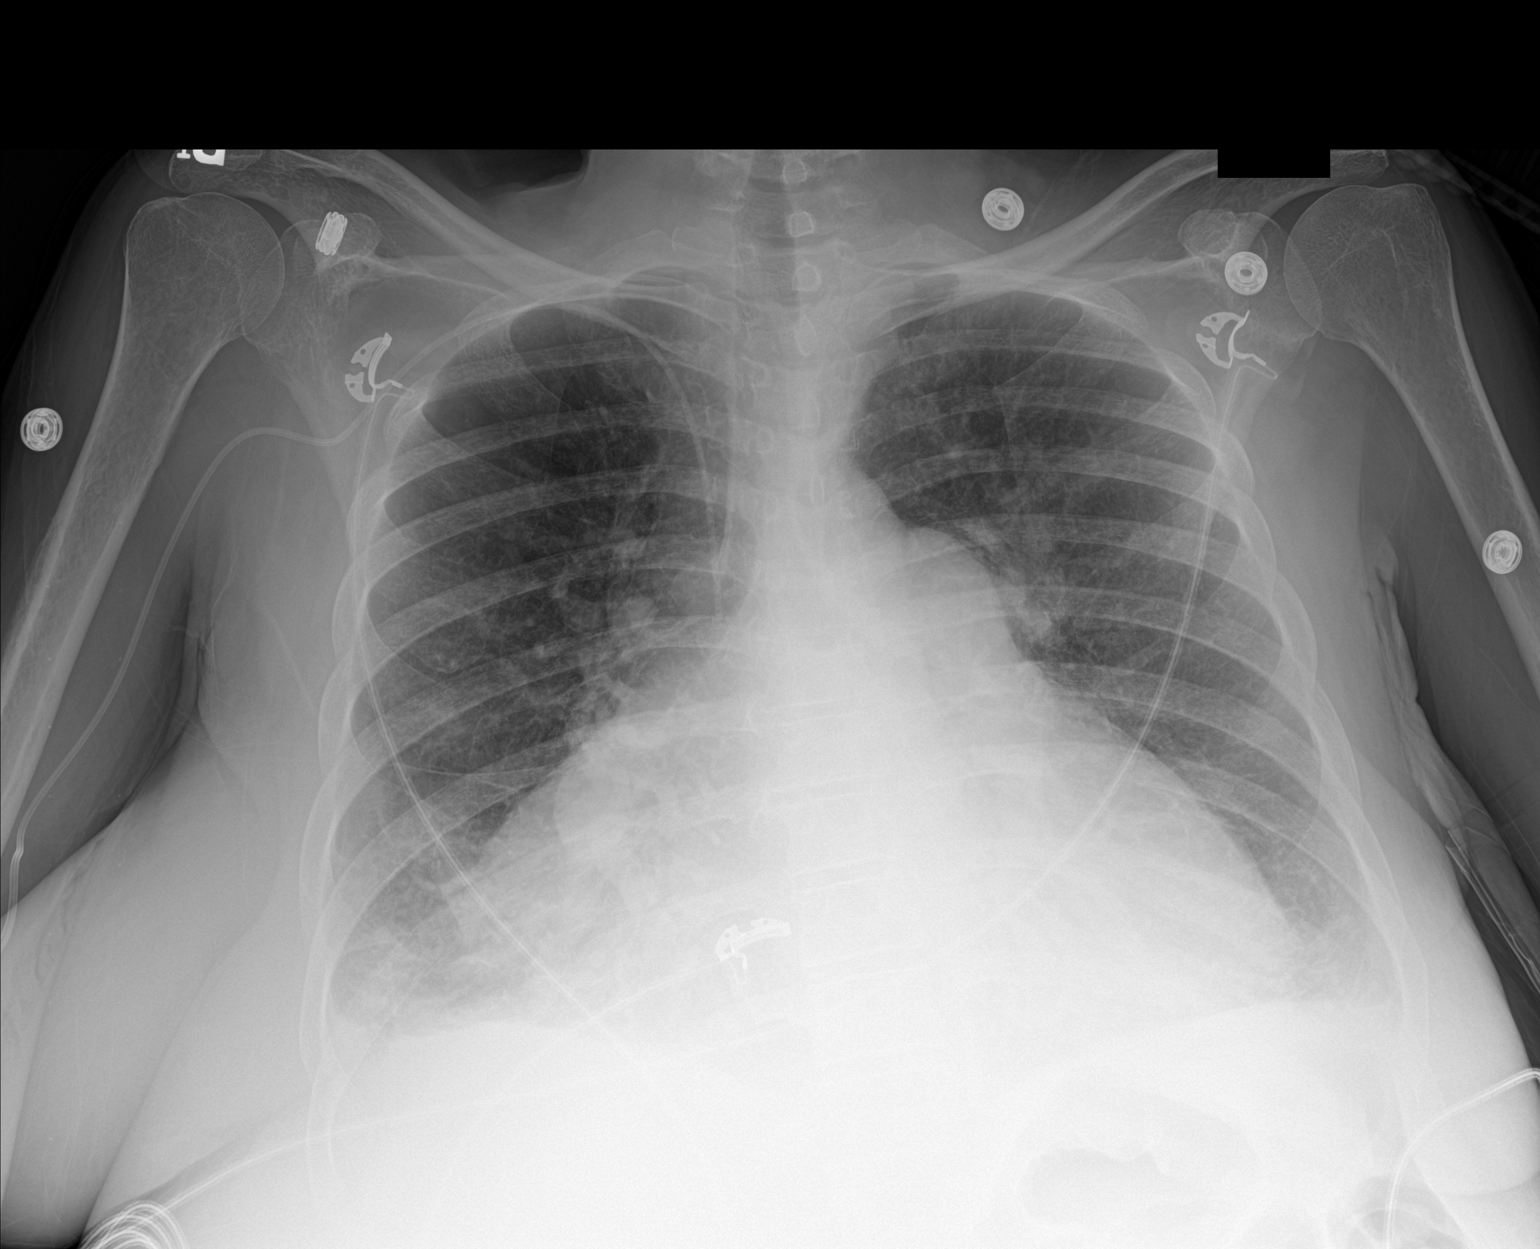

[1 of 1 positions shown; findings below may reference images not displayed]

FINDINGS: Right PICC line stable position. Stable severe cardiomegaly.
Persistent low lung volumes with persistent consolidation right
lower lobe. Persistent small right pleural effusion. No interim
change.
IMPRESSION: 1. Right PICC line stable position.

2. Persistent right lower lobe consolidation and small right pleural
effusion. No change.

3. Stable severe cardiomegaly.

## 2017-06-27 ENCOUNTER — Ambulatory Visit: Payer: BLUE CROSS/BLUE SHIELD

## 2017-06-27 ENCOUNTER — Ambulatory Visit: Payer: BLUE CROSS/BLUE SHIELD | Admitting: Emergency Medicine

## 2017-08-28 ENCOUNTER — Other Ambulatory Visit: Payer: Self-pay | Admitting: Gastroenterology

## 2017-08-28 DIAGNOSIS — D509 Iron deficiency anemia, unspecified: Secondary | ICD-10-CM

## 2017-08-29 ENCOUNTER — Encounter: Payer: Self-pay | Admitting: Emergency Medicine

## 2017-08-29 ENCOUNTER — Ambulatory Visit (INDEPENDENT_AMBULATORY_CARE_PROVIDER_SITE_OTHER): Payer: BLUE CROSS/BLUE SHIELD | Admitting: Emergency Medicine

## 2017-08-29 ENCOUNTER — Ambulatory Visit (INDEPENDENT_AMBULATORY_CARE_PROVIDER_SITE_OTHER): Payer: BLUE CROSS/BLUE SHIELD | Admitting: *Deleted

## 2017-08-29 VITALS — BP 96/62 | HR 74 | Ht 60.0 in | Wt 119.2 lb

## 2017-08-29 DIAGNOSIS — R0602 Shortness of breath: Secondary | ICD-10-CM

## 2017-08-29 DIAGNOSIS — I509 Heart failure, unspecified: Secondary | ICD-10-CM

## 2017-08-29 DIAGNOSIS — I2729 Other secondary pulmonary hypertension: Secondary | ICD-10-CM

## 2017-08-29 DIAGNOSIS — I272 Pulmonary hypertension, unspecified: Secondary | ICD-10-CM

## 2017-08-29 DIAGNOSIS — J449 Chronic obstructive pulmonary disease, unspecified: Secondary | ICD-10-CM

## 2017-08-29 LAB — PULMONARY FUNCTION TEST
DL/VA % pred: 27 %
DL/VA: 1.17 ml/min/mmHg/L
DLCO COR: 4.41 ml/min/mmHg
DLCO UNC % PRED: 23 %
DLCO cor % pred: 23 %
DLCO unc: 4.42 ml/min/mmHg
FEF 25-75 PRE: 0.43 L/s
FEF 25-75 Post: 0.57 L/sec
FEF2575-%Change-Post: 32 %
FEF2575-%PRED-PRE: 18 %
FEF2575-%Pred-Post: 24 %
FEV1-%Change-Post: 9 %
FEV1-%Pred-Post: 59 %
FEV1-%Pred-Pre: 53 %
FEV1-POST: 1.21 L
FEV1-Pre: 1.1 L
FEV1FVC-%Change-Post: 1 %
FEV1FVC-%Pred-Pre: 58 %
FEV6-%CHANGE-POST: 9 %
FEV6-%PRED-PRE: 89 %
FEV6-%Pred-Post: 97 %
FEV6-POST: 2.4 L
FEV6-Pre: 2.19 L
FEV6FVC-%Change-Post: 1 %
FEV6FVC-%PRED-POST: 99 %
FEV6FVC-%Pred-Pre: 97 %
FVC-%Change-Post: 7 %
FVC-%Pred-Post: 98 %
FVC-%Pred-Pre: 91 %
FVC-Post: 2.48 L
FVC-Pre: 2.31 L
POST FEV6/FVC RATIO: 97 %
Post FEV1/FVC ratio: 49 %
Pre FEV1/FVC ratio: 48 %
Pre FEV6/FVC Ratio: 95 %
RV % pred: 128 %
RV: 1.95 L
TLC % PRED: 97 %
TLC: 4.32 L

## 2017-08-29 NOTE — Progress Notes (Signed)
PFT done today. 

## 2017-08-29 NOTE — Patient Instructions (Addendum)
Your inflammatory lab work to assess for autoimmune disease associated with pulmonary hypertension was all negative. Please continue your inhaled medications and your oxygen as you have been using them Do not restart your blood thinning medication at this time Your pulmonary disease, pulmonary hypertension, cardiac disease place you at increased risk for surgical procedure and general anesthesia. This does not preclude an operation should the benefits outweigh these risks. We will not start any targeted pulmonary hypertension medication at this time. We may decide to reassess as we go forward. We will follow your 6 minute walk and your echocardiogram Follow with Dr Lamonte Sakai in 6 months or sooner if you have any problems

## 2017-08-29 NOTE — Progress Notes (Signed)
Subjective:    Patient ID: Shannon Schneider, female    DOB: July 14, 1971, 46 y.o.   MRN: [id number]  HPI 72- year-old smoker with sickle cell anemia, history card or myopathy with moderate mitral regurgitation, severe tricuspid regurgitation, pulmonary hypertension with right heart dysfunction. Also with suspected COPD. This was confirmed on pulmonary function today that I have reviewed. This shows severe obstruction without a bronchodilator response, normal lung volumes, severely decreased diffusion capacity. 6 min walk performed today on 4 L/m. Her distance was 216 m. She did not desaturate. She did have to stop during the test due to dyspnea. She has been on spiriva and dulera x 2 yrs, has benefited from these. She rarely needs albuterol. Last sickle cell crisis was this year, before that had been 15 yrs. Autoimmune labs negative. Denies any snoring, daytime sleepiness.    Review of Systems  Past Medical History:  Diagnosis Date  . Congestive heart failure (CHF) ([place]) 06/27/15  . COPD (chronic obstructive pulmonary disease) ([place]) 06/27/15  . Hyperbilirubinemia   . 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 Rectal mass   . Sickle cell anemia (HCC)      Family History  Problem Relation Age of Onset  . Renal Disease Mother   . Diabetes Mother   . Hypertension Mother   . Emphysema Father   . Breast cancer Neg Hx      Social History   Social History  . Marital status: Married    Spouse name: N/A  . Number of children: N/A  . Years of education: N/A   Occupational History  . Not on file.   Social History Main Topics  . Smoking status: Current Every Day Smoker    Packs/day: 0.25    Years: 20.00    Types: Cigarettes  . Smokeless tobacco: Never Used  . Alcohol use 0.0 oz/week     Comment: occasionally  . Drug use: No  . Sexual activity: Not on file   Other Topics Concern  . Not on file   Social History Narrative   Independent at baseline     No Known Allergies   Outpatient  Medications Prior to Visit  Medication Sig Dispense Refill  . albuterol (PROVENTIL HFA;VENTOLIN HFA) 108 (90 Base) MCG/ACT inhaler Inhale 2 puffs into the lungs every 6 (six) hours as needed for wheezing.    . citalopram (CELEXA) 10 MG tablet Take 10 mg by mouth at bedtime.    . digoxin (LANOXIN) 0.125 MG tablet Take 1 tablet (0.125 mg total) by mouth daily. 30 tablet 0  . Ergocalciferol 2000 units TABS Take 2,000 Units by mouth daily.     . furosemide (LASIX) 40 MG tablet Take 1 tablet (40 mg total) by mouth 2 (two) times daily. 30 tablet 0  . loratadine (CLARITIN) 10 MG tablet Take 10 mg by mouth daily.    . midodrine (PROAMATINE) 2.5 MG tablet Take 1 tablet (2.5 mg total) by mouth 3 (three) times daily with meals.    . mometasone-formoterol (DULERA) 200-5 MCG/ACT AERO Inhale 2 puffs into the lungs 2 (two) times daily. 1 Inhaler 0  . oxyCODONE 10 MG TABS Take 1 tablet (10 mg total) by mouth every 6 (six) hours as needed for moderate pain or severe pain (pain). 20 tablet 0  . OXYGEN Inhale 4 L into the lungs continuous.     Marland Kitchen spironolactone (ALDACTONE) 25 MG tablet Take 25 mg by mouth daily.    . Tiotropium Bromide Monohydrate (SPIRIVA RESPIMAT) 2.5  MCG/ACT AERS Inhale 1 puff into the lungs 2 (two) times daily.    . metolazone (ZAROXOLYN) 2.5 MG tablet Take 2 tablets (5 mg total) by mouth daily. (Patient not taking: Reported on 08/29/2017)    . metoprolol tartrate (LOPRESSOR) 25 MG tablet Take 0.5 tablets (12.5 mg total) by mouth 2 (two) times daily. (Patient not taking: Reported on 08/29/2017) 30 tablet 0  . rivaroxaban (XARELTO) 20 MG TABS tablet Take 1 tablet (20 mg total) by mouth daily. (Patient not taking: Reported on 08/29/2017) 30 tablet 0   No facility-administered medications prior to visit.          Objective:   Physical Exam Vitals:   08/29/17 1619  BP: 96/62  Pulse: 74  SpO2: 94%  Weight: 119 lb 3.2 oz (54.1 kg)  Height: 5' (1.524 m)   Gen: Pleasant, Chronically  ill-appearing in no distress,  normal affect  ENT: No lesions,  mouth clear,  oropharynx clear, no postnasal drip  Neck: No JVD, no stridor  Lungs: No use of accessory muscles, distant but clear  Cardiovascular: RRR, heart sounds normal, no murmur or gallops, no peripheral edema  Musculoskeletal: No deformities, no cyanosis or clubbing  Neuro: alert, non focal  Skin: Warm, no lesions or rashes   04/14/17 --  Study Conclusions  - Left ventricle: The cavity size was mildly dilated. Systolic   function was vigorous. The estimated ejection fraction was 75%.   Doppler parameters are consistent with abnormal left ventricular   relaxation (grade 1 diastolic dysfunction). - Mitral valve: There was moderate regurgitation. - Right ventricle: The cavity size was severely dilated. - Tricuspid valve: There was severe regurgitation. - Pulmonic valve: There was moderate regurgitation. - Pulmonary arteries: PA peak pressure: 65 mm Hg (S). - Pericardium, extracardiac: A trivial pericardial effusion was   identified posterior to the heart.  Impressions:  - The right ventricular systolic pressure was increased consistent   with severe pulmonary hypertension. Severely dilated right   atrium, left atrium and right ventricle and mildly dilated left   ventricle with normal left reticular systolic function and mild   diastolic dysfunction and evidence of right-sided pressure   overload due to severe pulmonary hypertension. Pulmonary artery   pressures as high as 65 mmHg. No evidence of any significant   pericardial effusion.     Assessment & Plan:  No problem-specific Assessment & Plan notes found for this encounter.

## 2017-08-29 NOTE — Progress Notes (Signed)
SIX MIN WALK 08/29/2017  Medications digoxin 0.125mg , furosemide 40mg , loratadine 10mg , midodrine 2.5mg , Dulera 200-5, spironolactone 25mg   Supplimental Oxygen during Test? (L/min) No  Laps 4  Partial Lap (in Meters) 24  Baseline BP (sitting) 120/68  Baseline Heartrate 82  Baseline Dyspnea (Borg Scale) 0.5  Baseline Fatigue (Borg Scale) 2  Baseline SPO2 83  BP (sitting) 128/74  Heartrate 87  Dyspnea (Borg Scale) 0.5  Fatigue (Borg Scale) 2  SPO2 90  BP (sitting) 126/72  Heartrate 95  SPO2 90  Stopped or Paused before Six Minutes Yes  Other Symptoms at end of Exercise Patient stopped with 1:26 due to fatigue. Legs felt wobbly.   Distance Completed 216  Tech Comments: Patient was able to walk at a steady pace. Denied any symptoms of SOB, but did need to stop during test due to fatigue. She was 4L during walk.

## 2017-09-05 ENCOUNTER — Ambulatory Visit: Payer: BLUE CROSS/BLUE SHIELD

## 2017-09-09 ENCOUNTER — Ambulatory Visit
Admission: RE | Admit: 2017-09-09 | Discharge: 2017-09-09 | Disposition: A | Discharge status: Home / Self Care | Payer: BLUE CROSS/BLUE SHIELD | Source: Ambulatory Visit | Attending: Gastroenterology | Admitting: Gastroenterology

## 2017-09-09 DIAGNOSIS — K219 Gastro-esophageal reflux disease without esophagitis: Secondary | ICD-10-CM | POA: Insufficient documentation

## 2017-09-09 DIAGNOSIS — D509 Iron deficiency anemia, unspecified: Secondary | ICD-10-CM | POA: Insufficient documentation

## 2017-10-23 ENCOUNTER — Inpatient Hospital Stay (HOSPITAL_COMMUNITY)
Admission: AD | Admit: 2017-10-23 | Discharge: 2017-10-25 | DRG: 682 | Disposition: A | Discharge status: Home / Self Care | Payer: BLUE CROSS/BLUE SHIELD | Source: Other Acute Inpatient Hospital | Attending: Internal Medicine | Admitting: Internal Medicine

## 2017-10-23 ENCOUNTER — Encounter: Payer: Self-pay | Admitting: Emergency Medicine

## 2017-10-23 ENCOUNTER — Emergency Department
Admission: EM | Admit: 2017-10-23 | Discharge: 2017-10-23 | Disposition: A | Discharge status: Other Acute Inpatient Hospital | Payer: BLUE CROSS/BLUE SHIELD | Attending: Emergency Medicine | Admitting: Emergency Medicine

## 2017-10-23 ENCOUNTER — Emergency Department: Payer: BLUE CROSS/BLUE SHIELD

## 2017-10-23 ENCOUNTER — Encounter (HOSPITAL_COMMUNITY): Payer: Self-pay | Admitting: *Deleted

## 2017-10-23 DIAGNOSIS — Z79899 Other long term (current) drug therapy: Secondary | ICD-10-CM

## 2017-10-23 DIAGNOSIS — E860 Dehydration: Secondary | ICD-10-CM | POA: Diagnosis present

## 2017-10-23 DIAGNOSIS — D72829 Elevated white blood cell count, unspecified: Secondary | ICD-10-CM | POA: Diagnosis not present

## 2017-10-23 DIAGNOSIS — Z9981 Dependence on supplemental oxygen: Secondary | ICD-10-CM

## 2017-10-23 DIAGNOSIS — J9621 Acute and chronic respiratory failure with hypoxia: Secondary | ICD-10-CM | POA: Diagnosis not present

## 2017-10-23 DIAGNOSIS — Z8249 Family history of ischemic heart disease and other diseases of the circulatory system: Secondary | ICD-10-CM | POA: Diagnosis not present

## 2017-10-23 DIAGNOSIS — F17210 Nicotine dependence, cigarettes, uncomplicated: Secondary | ICD-10-CM | POA: Diagnosis present

## 2017-10-23 DIAGNOSIS — J9611 Chronic respiratory failure with hypoxia: Secondary | ICD-10-CM

## 2017-10-23 DIAGNOSIS — Z9049 Acquired absence of other specified parts of digestive tract: Secondary | ICD-10-CM

## 2017-10-23 DIAGNOSIS — J441 Chronic obstructive pulmonary disease with (acute) exacerbation: Secondary | ICD-10-CM | POA: Diagnosis not present

## 2017-10-23 DIAGNOSIS — N179 Acute kidney failure, unspecified: Secondary | ICD-10-CM | POA: Diagnosis not present

## 2017-10-23 DIAGNOSIS — I2729 Other secondary pulmonary hypertension: Secondary | ICD-10-CM | POA: Diagnosis not present

## 2017-10-23 DIAGNOSIS — F419 Anxiety disorder, unspecified: Secondary | ICD-10-CM | POA: Diagnosis present

## 2017-10-23 DIAGNOSIS — I429 Cardiomyopathy, unspecified: Secondary | ICD-10-CM | POA: Diagnosis not present

## 2017-10-23 DIAGNOSIS — I272 Pulmonary hypertension, unspecified: Secondary | ICD-10-CM | POA: Diagnosis not present

## 2017-10-23 DIAGNOSIS — I081 Rheumatic disorders of both mitral and tricuspid valves: Secondary | ICD-10-CM | POA: Diagnosis not present

## 2017-10-23 DIAGNOSIS — Z7951 Long term (current) use of inhaled steroids: Secondary | ICD-10-CM

## 2017-10-23 DIAGNOSIS — I340 Nonrheumatic mitral (valve) insufficiency: Secondary | ICD-10-CM | POA: Diagnosis not present

## 2017-10-23 DIAGNOSIS — R002 Palpitations: Secondary | ICD-10-CM | POA: Diagnosis present

## 2017-10-23 DIAGNOSIS — D5700 Hb-SS disease with crisis, unspecified: Secondary | ICD-10-CM | POA: Diagnosis present

## 2017-10-23 DIAGNOSIS — I509 Heart failure, unspecified: Secondary | ICD-10-CM | POA: Diagnosis not present

## 2017-10-23 DIAGNOSIS — R0603 Acute respiratory distress: Secondary | ICD-10-CM | POA: Diagnosis present

## 2017-10-23 DIAGNOSIS — I5030 Unspecified diastolic (congestive) heart failure: Secondary | ICD-10-CM | POA: Diagnosis present

## 2017-10-23 DIAGNOSIS — F17200 Nicotine dependence, unspecified, uncomplicated: Secondary | ICD-10-CM | POA: Diagnosis not present

## 2017-10-23 DIAGNOSIS — J432 Centrilobular emphysema: Secondary | ICD-10-CM

## 2017-10-23 DIAGNOSIS — I4891 Unspecified atrial fibrillation: Secondary | ICD-10-CM | POA: Diagnosis present

## 2017-10-23 DIAGNOSIS — I071 Rheumatic tricuspid insufficiency: Secondary | ICD-10-CM | POA: Diagnosis not present

## 2017-10-23 DIAGNOSIS — T508X5A Adverse effect of diagnostic agents, initial encounter: Secondary | ICD-10-CM | POA: Diagnosis present

## 2017-10-23 DIAGNOSIS — T501X5A Adverse effect of loop [high-ceiling] diuretics, initial encounter: Secondary | ICD-10-CM | POA: Diagnosis present

## 2017-10-23 DIAGNOSIS — R0602 Shortness of breath: Secondary | ICD-10-CM | POA: Diagnosis not present

## 2017-10-23 HISTORY — DX: Nonrheumatic mitral (valve) insufficiency: I34.0

## 2017-10-23 HISTORY — DX: Chronic respiratory failure with hypoxia: J96.11

## 2017-10-23 HISTORY — DX: Rheumatic tricuspid insufficiency: I07.1

## 2017-10-23 HISTORY — DX: Unspecified atrial fibrillation: I48.91

## 2017-10-23 HISTORY — DX: Emphysema, unspecified: J43.9

## 2017-10-23 HISTORY — DX: Other secondary pulmonary hypertension: I27.29

## 2017-10-23 HISTORY — DX: Nicotine dependence, unspecified, uncomplicated: F17.200

## 2017-10-23 LAB — CBC WITH DIFFERENTIAL/PLATELET
BAND NEUTROPHILS: 0 %
BASOS PCT: 0 %
Basophils Absolute: 0 10*3/uL (ref 0–0.1)
Blasts: 0 %
EOS ABS: 0.2 10*3/uL (ref 0–0.7)
Eosinophils Relative: 1 %
HEMATOCRIT: 33.3 % — AB (ref 35.0–47.0)
Hemoglobin: 11.7 g/dL — ABNORMAL LOW (ref 12.0–16.0)
LYMPHS PCT: 24 %
Lymphs Abs: 4.7 10*3/uL — ABNORMAL HIGH (ref 1.0–3.6)
MCH: 36.7 pg — AB (ref 26.0–34.0)
MCHC: 35.1 g/dL (ref 32.0–36.0)
MCV: 104.4 fL — AB (ref 80.0–100.0)
MONO ABS: 0.4 10*3/uL (ref 0.2–0.9)
MONOS PCT: 2 %
Metamyelocytes Relative: 0 %
Myelocytes: 0 %
NEUTROS ABS: 14.4 10*3/uL — AB (ref 1.4–6.5)
NEUTROS PCT: 73 %
NRBC: 3 /100{WBCs} — AB
OTHER: 0 %
PLATELETS: 489 10*3/uL — AB (ref 150–440)
PROMYELOCYTES ABS: 0 %
RBC: 3.19 MIL/uL — ABNORMAL LOW (ref 3.80–5.20)
RDW: 21.6 % — AB (ref 11.5–14.5)
SMEAR REVIEW: ADEQUATE
WBC: 19.7 10*3/uL — ABNORMAL HIGH (ref 3.6–11.0)

## 2017-10-23 LAB — BASIC METABOLIC PANEL
ANION GAP: 11 (ref 5–15)
BUN: 31 mg/dL — AB (ref 6–20)
CO2: 26 mmol/L (ref 22–32)
Calcium: 10.2 mg/dL (ref 8.9–10.3)
Chloride: 100 mmol/L — ABNORMAL LOW (ref 101–111)
Creatinine, Ser: 1.28 mg/dL — ABNORMAL HIGH (ref 0.44–1.00)
GFR calc Af Amer: 57 mL/min — ABNORMAL LOW (ref >60)
GFR, EST NON AFRICAN AMERICAN: 49 mL/min — AB (ref >60)
GLUCOSE: 108 mg/dL — AB (ref 65–99)
POTASSIUM: 3.9 mmol/L (ref 3.5–5.1)
Sodium: 137 mmol/L (ref 135–145)

## 2017-10-23 LAB — BRAIN NATRIURETIC PEPTIDE: B NATRIURETIC PEPTIDE 5: 141 pg/mL — AB (ref 0.0–100.0)

## 2017-10-23 LAB — CBC
HCT: 30.9 % — ABNORMAL LOW (ref 36.0–46.0)
HEMOGLOBIN: 11 g/dL — AB (ref 12.0–15.0)
MCH: 35.5 pg — AB (ref 26.0–34.0)
MCHC: 35.6 g/dL (ref 30.0–36.0)
MCV: 99.7 fL (ref 78.0–100.0)
PLATELETS: 496 10*3/uL — AB (ref 150–400)
RBC: 3.1 MIL/uL — AB (ref 3.87–5.11)
RDW: 22.4 % — ABNORMAL HIGH (ref 11.5–15.5)
WBC: 19.7 10*3/uL — ABNORMAL HIGH (ref 4.0–10.5)

## 2017-10-23 LAB — DIGOXIN LEVEL: Digoxin Level: 0.9 ng/mL (ref 0.8–2.0)

## 2017-10-23 LAB — PROTIME-INR
INR: 1.04
Prothrombin Time: 13.5 seconds (ref 11.4–15.2)

## 2017-10-23 LAB — TROPONIN I

## 2017-10-23 LAB — LIPID PANEL
CHOL/HDL RATIO: 4.5 ratio
Cholesterol: 175 mg/dL (ref 0–200)
HDL: 39 mg/dL — ABNORMAL LOW (ref >40)
LDL CALC: 106 mg/dL — AB (ref 0–99)
Triglycerides: 151 mg/dL — ABNORMAL HIGH (ref <150)
VLDL: 30 mg/dL (ref 0–40)

## 2017-10-23 LAB — LACTIC ACID, PLASMA: LACTIC ACID, VENOUS: 1.5 mmol/L (ref 0.5–1.9)

## 2017-10-23 LAB — CREATININE, SERUM
CREATININE: 1.13 mg/dL — AB (ref 0.44–1.00)
GFR calc Af Amer: >60 mL/min (ref >60)
GFR, EST NON AFRICAN AMERICAN: 57 mL/min — AB (ref >60)

## 2017-10-23 LAB — APTT: aPTT: 28 seconds (ref 24–36)

## 2017-10-23 MED ORDER — BUDESONIDE 0.25 MG/2ML IN SUSP
0.2500 mg | Freq: Two times a day (BID) | RESPIRATORY_TRACT | Status: DC
  Administered 2017-10-23 – 2017-10-25 (×4): 0.25 mg via RESPIRATORY_TRACT
  Filled 2017-10-23 (×5): qty 2

## 2017-10-23 MED ORDER — IOPAMIDOL (ISOVUE-370) INJECTION 76%
75.0000 mL | Freq: Once | INTRAVENOUS | Status: AC | PRN
  Administered 2017-10-23: 75 mL via INTRAVENOUS

## 2017-10-23 MED ORDER — KETOROLAC TROMETHAMINE 30 MG/ML IJ SOLN
15.0000 mg | Freq: Four times a day (QID) | INTRAMUSCULAR | Status: DC
  Administered 2017-10-23 – 2017-10-24 (×4): 15 mg via INTRAVENOUS
  Filled 2017-10-23 (×4): qty 1

## 2017-10-23 MED ORDER — SPIRONOLACTONE 25 MG PO TABS
12.5000 mg | ORAL_TABLET | Freq: Every day | ORAL | Status: DC
  Administered 2017-10-23 – 2017-10-25 (×3): 12.5 mg via ORAL
  Filled 2017-10-23 (×3): qty 1

## 2017-10-23 MED ORDER — DEXTROSE 5 % IV SOLN
500.0000 mg | Freq: Once | INTRAVENOUS | Status: AC
  Administered 2017-10-23: 500 mg via INTRAVENOUS
  Filled 2017-10-23: qty 500

## 2017-10-23 MED ORDER — MIDODRINE HCL 2.5 MG PO TABS
2.5000 mg | ORAL_TABLET | Freq: Three times a day (TID) | ORAL | Status: DC
  Administered 2017-10-23 – 2017-10-25 (×5): 2.5 mg via ORAL
  Filled 2017-10-23 (×8): qty 1

## 2017-10-23 MED ORDER — ALBUTEROL SULFATE (2.5 MG/3ML) 0.083% IN NEBU
2.5000 mg | INHALATION_SOLUTION | Freq: Once | RESPIRATORY_TRACT | Status: AC
  Administered 2017-10-23: 2.5 mg via RESPIRATORY_TRACT
  Filled 2017-10-23: qty 3

## 2017-10-23 MED ORDER — METHYLPREDNISOLONE SODIUM SUCC 125 MG IJ SOLR
125.0000 mg | Freq: Once | INTRAMUSCULAR | Status: AC
  Administered 2017-10-23: 125 mg via INTRAVENOUS
  Filled 2017-10-23: qty 2

## 2017-10-23 MED ORDER — VITAMIN D 1000 UNITS PO TABS
2000.0000 [IU] | ORAL_TABLET | Freq: Every day | ORAL | Status: DC
  Administered 2017-10-23 – 2017-10-25 (×3): 2000 [IU] via ORAL
  Filled 2017-10-23 (×3): qty 2

## 2017-10-23 MED ORDER — ACETAMINOPHEN 325 MG PO TABS
650.0000 mg | ORAL_TABLET | Freq: Four times a day (QID) | ORAL | Status: DC | PRN
Start: 2017-10-23 — End: 2017-10-24
  Administered 2017-10-24: 650 mg via ORAL
  Filled 2017-10-23: qty 2

## 2017-10-23 MED ORDER — TIOTROPIUM BROMIDE MONOHYDRATE 2.5 MCG/ACT IN AERS: 1.0000 | INHALATION_SPRAY | Freq: Two times a day (BID) | RESPIRATORY_TRACT | Status: DC

## 2017-10-23 MED ORDER — SODIUM CHLORIDE 0.9% FLUSH: 3.0000 mL | INTRAVENOUS | Status: DC | PRN

## 2017-10-23 MED ORDER — SODIUM CHLORIDE 0.9 % IV SOLN: 250.0000 mL | INTRAVENOUS | Status: DC | PRN

## 2017-10-23 MED ORDER — IPRATROPIUM-ALBUTEROL 0.5-2.5 (3) MG/3ML IN SOLN
3.0000 mL | Freq: Four times a day (QID) | RESPIRATORY_TRACT | Status: DC
  Administered 2017-10-23 – 2017-10-25 (×7): 3 mL via RESPIRATORY_TRACT
  Filled 2017-10-23 (×7): qty 3

## 2017-10-23 MED ORDER — ALBUTEROL SULFATE (2.5 MG/3ML) 0.083% IN NEBU
5.0000 mg | INHALATION_SOLUTION | Freq: Once | RESPIRATORY_TRACT | Status: AC
  Administered 2017-10-23: 5 mg via RESPIRATORY_TRACT
  Filled 2017-10-23: qty 6

## 2017-10-23 MED ORDER — ENOXAPARIN SODIUM 40 MG/0.4ML ~~LOC~~ SOLN
40.0000 mg | SUBCUTANEOUS | Status: DC
  Administered 2017-10-23 – 2017-10-24 (×2): 40 mg via SUBCUTANEOUS
  Filled 2017-10-23 (×2): qty 0.4

## 2017-10-23 MED ORDER — DEXTROSE 5 % IV SOLN
2.0000 g | Freq: Once | INTRAVENOUS | Status: AC
  Administered 2017-10-23: 2 g via INTRAVENOUS
  Filled 2017-10-23: qty 2

## 2017-10-23 MED ORDER — ASPIRIN 81 MG PO CHEW
324.0000 mg | CHEWABLE_TABLET | Freq: Once | ORAL | Status: AC
  Administered 2017-10-23: 324 mg via ORAL

## 2017-10-23 MED ORDER — LORAZEPAM 2 MG/ML IJ SOLN
0.2500 mg | Freq: Once | INTRAMUSCULAR | Status: AC
  Administered 2017-10-23: 0.25 mg via INTRAVENOUS
  Filled 2017-10-23: qty 1

## 2017-10-23 MED ORDER — SODIUM CHLORIDE 0.9 % IV SOLN
INTRAVENOUS | Status: DC
  Administered 2017-10-23: 10:00:00 via INTRAVENOUS

## 2017-10-23 MED ORDER — SODIUM CHLORIDE 0.9 % IV SOLN
INTRAVENOUS | Status: AC
  Administered 2017-10-23: 19:00:00 via INTRAVENOUS

## 2017-10-23 MED ORDER — MOMETASONE FURO-FORMOTEROL FUM 200-5 MCG/ACT IN AERO
2.0000 | INHALATION_SPRAY | Freq: Two times a day (BID) | RESPIRATORY_TRACT | Status: DC
  Filled 2017-10-23: qty 8.8

## 2017-10-23 MED ORDER — ALBUTEROL SULFATE (2.5 MG/3ML) 0.083% IN NEBU
2.5000 mg | INHALATION_SOLUTION | RESPIRATORY_TRACT | Status: DC | PRN
  Administered 2017-10-23: 2.5 mg via RESPIRATORY_TRACT
  Filled 2017-10-23: qty 3

## 2017-10-23 MED ORDER — DIGOXIN 125 MCG PO TABS
0.1250 mg | ORAL_TABLET | Freq: Every day | ORAL | Status: DC
  Administered 2017-10-23 – 2017-10-25 (×3): 0.125 mg via ORAL
  Filled 2017-10-23 (×3): qty 1

## 2017-10-23 MED ORDER — SENNOSIDES-DOCUSATE SODIUM 8.6-50 MG PO TABS: 1.0000 | ORAL_TABLET | Freq: Every evening | ORAL | Status: DC | PRN

## 2017-10-23 MED ORDER — ALBUTEROL SULFATE (2.5 MG/3ML) 0.083% IN NEBU: 2.5000 mg | INHALATION_SOLUTION | Freq: Four times a day (QID) | RESPIRATORY_TRACT | Status: DC

## 2017-10-23 MED ORDER — NICOTINE 14 MG/24HR TD PT24
14.0000 mg | MEDICATED_PATCH | Freq: Every day | TRANSDERMAL | Status: DC
  Administered 2017-10-23 – 2017-10-24 (×2): 14 mg via TRANSDERMAL
  Filled 2017-10-23 (×3): qty 1

## 2017-10-23 MED ORDER — LORATADINE 10 MG PO TABS
10.0000 mg | ORAL_TABLET | Freq: Every day | ORAL | Status: DC
  Administered 2017-10-23 – 2017-10-25 (×3): 10 mg via ORAL
  Filled 2017-10-23 (×3): qty 1

## 2017-10-23 MED ORDER — SODIUM CHLORIDE 0.9% FLUSH
3.0000 mL | Freq: Two times a day (BID) | INTRAVENOUS | Status: DC
  Administered 2017-10-23 – 2017-10-24 (×3): 3 mL via INTRAVENOUS

## 2017-10-23 MED ORDER — FUROSEMIDE 40 MG PO TABS
40.0000 mg | ORAL_TABLET | Freq: Two times a day (BID) | ORAL | Status: DC
  Administered 2017-10-23 – 2017-10-24 (×2): 40 mg via ORAL
  Filled 2017-10-23 (×3): qty 1

## 2017-10-23 MED ORDER — CITALOPRAM HYDROBROMIDE 20 MG PO TABS
10.0000 mg | ORAL_TABLET | Freq: Every day | ORAL | Status: DC
  Administered 2017-10-23 – 2017-10-24 (×2): 10 mg via ORAL
  Filled 2017-10-23 (×2): qty 1

## 2017-10-23 MED ORDER — TIOTROPIUM BROMIDE MONOHYDRATE 18 MCG IN CAPS
18.0000 ug | ORAL_CAPSULE | Freq: Every day | RESPIRATORY_TRACT | Status: DC
  Filled 2017-10-23: qty 5

## 2017-10-23 MED ORDER — PANTOPRAZOLE SODIUM 40 MG PO TBEC
40.0000 mg | DELAYED_RELEASE_TABLET | Freq: Every day | ORAL | Status: DC
  Administered 2017-10-23 – 2017-10-25 (×3): 40 mg via ORAL
  Filled 2017-10-23 (×3): qty 1

## 2017-10-23 MED ORDER — VANCOMYCIN HCL IN DEXTROSE 1-5 GM/200ML-% IV SOLN
1000.0000 mg | Freq: Once | INTRAVENOUS | Status: AC
  Administered 2017-10-23: 1000 mg via INTRAVENOUS
  Filled 2017-10-23: qty 200

## 2017-10-23 NOTE — Progress Notes (Signed)
Pt transferred to 1425 to telemetry floor per doctors orders.

## 2017-10-23 NOTE — ED Notes (Signed)
Pt in CT at time of code sepsis called, will obtain blood culture and start meds as soon as pt returns

## 2017-10-23 NOTE — ED Notes (Signed)
CODE  STEMI  CALLED  TO  [facility]  AT [facility]

## 2017-10-23 NOTE — Progress Notes (Signed)
Patient received as transfer from 3W.  Agree with previous RN assessment of patient.  Vitals stable at time of transfer.  Patient oriented to unit and equipment; placed on telemetry and confirmed with CMT.  No family at the bedside at this time. Will continue to monitor.

## 2017-10-23 NOTE — ED Notes (Signed)
Code sepsis called to [facility]

## 2017-10-23 NOTE — Consult Note (Signed)
[facility] Consult  Physician Requesting Consult:  Marthann Schiller, M.D.   Date of Consult:  10/23/2017  Reason for Consult/Chief Complaint:  Acute on Chronic Hypoxic Respiratory Failure   History of Presenting Illness:  46 y.o. female with history of sickle cell anemia, cardiomyopathy,pulmonary hypertension, and suspected COPD. Patient is on 4 L/m for chronic hypoxic respiratory failure.Her last office visit with Dr. Delton Coombes on 8/31 documents a walk test distance of 216 m without desaturation on 4 L/m. Additionally, the patient has been on Spiriva and Dulera for approximately 2 years with perceived symptomatic benefit. Per documentation from the emergency department the patient endorsed shortness of breath starting several hours prior to her presentation earlier this morning to outside hospitals emergency Department. EMS found the patient with saturation in the 70s on room air and was switched to a nonrebreather mask. This was subsequently weaned to 4 L/m via nasal cannula which maintained her saturation in the 80s to 90s.There is no documentation of any wheezing on auscultation in [place]. The patient also denied any cough or fever at outside hospital. She also denied any sick contacts at that time. Documentation by the emergency department nurse indicates a saturation of 92% on 5 L/m. Code STEMI and code sepsis were both initiated. Cardiology assessed the patient and felt the EKG changes were nonspecific and without chest pain recommended outpatient follow-up after discharge from hospital. Patient continued to have intermittent periods of hypoxia with increasing oxygen requirements prompting pulmonary consultation this afternoon. Also note per documentation the patient had endorsed interim and palpitations with hot flashes for approximately 3 days. After further discussion per admitting physician patient was only hypoxic after oxygen therapy was removed. The patient and husband report  that he has had a cough but there is questionable temporal exposure as they are currently separated. She tested yesterday and developed shortness of breath. She woke up this morning again with shortness of breath as well as hot flashes. Her shortness of breath was helped by utilizing her albuterol rescue inhalation medication. She reports she has been adherent to her Spiriva and Dulera. Notably she has had to use her albuterol rescue inhaler 3 times this week which is significantly more than she routinely/normally requires.she denies any sore throat or sinus congestion. She denies any chest pain, pressure, or tightness. She does endorse a tightness over her upper abdomen as well as her lower thoracic back. She denies any subjective fever or chills otherwise. She denies any associated cough or wheezing with her dyspnea.patient has been adherent to her diuretic regimen. She reports her weight was 123.6 pounds on Tuesday and 124 pounds on Wednesday. This morning on her home scale she weighed 119.6 pounds. She has reportedly had a good appetite and adequate oral intake.  Review of Systems:  No nausea, vomiting, or diarrhea. No dysuria or hematuria. A pertinent 14 point review of systems is negative except as per the history of presenting illness.  Allergies  Allergen Reactions  . Symbicort [Budesonide-Formoterol Fumarate]     Rash on tongue    No current facility-administered medications on file prior to encounter.    Current Outpatient Prescriptions on File Prior to Encounter  Medication Sig Dispense Refill  . albuterol (PROVENTIL HFA;VENTOLIN HFA) 108 (90 Base) MCG/ACT inhaler Inhale 2 puffs into the lungs every 6 (six) hours as needed for wheezing.    . citalopram (CELEXA) 10 MG tablet Take 10 mg by mouth at bedtime.    . digoxin (LANOXIN) 0.125 MG tablet  Take 1 tablet (0.125 mg total) by mouth daily. 30 tablet 0  . Ergocalciferol 2000 units TABS Take 2,000 Units by mouth daily.     . furosemide  (LASIX) 40 MG tablet Take 1 tablet (40 mg total) by mouth 2 (two) times daily. 30 tablet 0  . loratadine (CLARITIN) 10 MG tablet Take 10 mg by mouth daily.    . metolazone (ZAROXOLYN) 2.5 MG tablet Take 2 tablets (5 mg total) by mouth daily. (Patient taking differently: Take 5 mg by mouth daily as needed. )    . midodrine (PROAMATINE) 2.5 MG tablet Take 1 tablet (2.5 mg total) by mouth 3 (three) times daily with meals.    . mometasone-formoterol (DULERA) 200-5 MCG/ACT AERO Inhale 2 puffs into the lungs 2 (two) times daily. 1 Inhaler 0  . OXYGEN Inhale 4 L into the lungs continuous.     . pantoprazole (PROTONIX) 40 MG tablet Take 40 mg by mouth daily.    Marland Kitchen spironolactone (ALDACTONE) 25 MG tablet Take 12.5 mg by mouth daily.     . Tiotropium Bromide Monohydrate (SPIRIVA RESPIMAT) 2.5 MCG/ACT AERS Inhale 1 puff into the lungs 2 (two) times daily.      Past Medical History:  Diagnosis Date  . Atrial fibrillation (HCC)   . Chronic respiratory failure with hypoxia (HCC)   . Congestive heart failure (CHF) (HCC) 06/27/15  . COPD (chronic obstructive pulmonary disease) (HCC) 06/27/15  . Emphysema of lung (HCC)   . Hyperbilirubinemia   . Mitral regurgitation   . Moderate tricuspid regurgitation   . 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 Pulmonary hypertension assoc with unclear multi-factorial mechanisms (HCC) 2013  . Rectal mass   . Sickle cell anemia (HCC)   . Tobacco use disorder     Past Surgical History:  Procedure Laterality Date  . CESAREAN SECTION  1989  . CHOLECYSTECTOMY  1996  . EUS N/A 07/13/2015   Procedure: LOWER ENDOSCOPIC ULTRASOUND (EUS);  Surgeon: Wayland Salinas, MD;  Location: [facility] ENDOSCOPY;  Service: Endoscopy;  Laterality: N/A;    Family History  Problem Relation Age of Onset  . Renal Disease Mother   . Diabetes Mother   . Hypertension Mother   . Emphysema Father   . Breast cancer Neg Hx     Social History   Social History  . Marital status: Married    Spouse  name: N/A  . Number of children: N/A  . Years of education: N/A   Social History Main Topics  . Smoking status: Current Every Day Smoker    Packs/day: 0.25    Years: 32.00    Types: Cigarettes    Start date: 10/30/1984  . Smokeless tobacco: Never Used     Comment: Stopped for 9 months w/ pregnancy - peak rate 1ppd  . Alcohol use 0.0 oz/week     Comment: occasionally  . Drug use: No  . Sexual activity: Not Asked   Other Topics Concern  . None   Social History Narrative   Independent at baseline      [facility] (10/23/17):   Currently separated from husband. No recent travel. Does have a dog at home. No mold exposure or bird exposure.   Temp:  [98.4 F (36.9 C)-99.5 F (37.5 C)] 99 F (37.2 C) (10/25 1615) Pulse Rate:  [67-110] 110 (10/25 1615) Resp:  [13-22] 14 (10/25 1503) BP: (91-134)/(61-87) 96/61 (10/25 1503) SpO2:  [84 %-100 %] 91 % (10/25 1815) Weight:  [119 lb 9.6 oz (54.3 kg)-128  lb 11.2 oz (58.4 kg)] 119 lb 9.6 oz (54.3 kg) (10/25 1203)  General:  Husband and mother in the room. Patient lying on her right side with nasal cannula in place & fan blowing on her. Integument:  Warm & dry. No rash or bruising on exposed skin.  Extremities:  No cyanosis.  Lymphatics:  No appreciated cervical or supraclavicular lymphadenoapthy. HEENT:  Moist mucus membranes. No scleral icterus.  Cardiovascular:  Regular rate . No edema. No JVD or hepatojugular reflux appreciated.  Pulmonary:  Minimal squeaks in the apices. Otherwise good aeration. Normal work of breathing on nasal cannula. Abdomen: Soft. Normal bowel sounds. Nondistended. No mass appreciated. Musculoskeletal:  Normal bulk and tone. No joint deformity or effusion appreciated. Neurological:  Cranial nerves 2-12 grossly in tact. No meningismus. Moving all 4 extremities equally.  Psychiatric:  Mood and affect congruent. Speech normal rhythm, rate & tone.   CBC Latest Ref Rng & Units 10/23/2017 10/23/2017 04/20/2017   WBC 4.0 - 10.5 K/uL 19.7(H) 19.7(H) 10.2  Hemoglobin 12.0 - 15.0 g/dL 11.0(L) 11.7(L) 8.4(L)  Hematocrit 36.0 - 46.0 % 30.9(L) 33.3(L) 24.3(L)  Platelets 150 - 400 K/uL 496(H) 489(H) 273    BMP Latest Ref Rng & Units 10/23/2017 10/23/2017 05/01/2017  Glucose 65 - 99 mg/dL - 457(U) 990(E)  BUN 6 - 20 mg/dL - 17(R) 20(Y)  Creatinine 0.44 - 1.00 mg/dL 5.42(L) 2.05(P) 8.42  Sodium 135 - 145 mmol/L - 137 137  Potassium 3.5 - 5.1 mmol/L - 3.9 4.0  Chloride 101 - 111 mmol/L - 100(L) 95(L)  CO2 22 - 32 mmol/L - 26 35(H)  Calcium 8.9 - 10.3 mg/dL - 15.1 9.7    IMAGING/STUDIES: ABG on [place] ??L/M 03/12/17:  7.32 / 40 / 61 / 89% TTE 04/14/17:  LV mildly dilated with EF 75%. Grade 1 diastolic dysfunction. RV severely dilated. Pulmonary artery systolic pressure 65 mmHg. No significant aortic regurgitation. Moderate mitral regurgitation. Moderate pulmonic regurgitation. Severe tricuspid regurgitation. Trivial pericardial effusion posterior to the heart. PFT 08/29/17: FVC 2.31 L (91%) FEV1 1.10 L (53%) FEV1/FVC 0.48 FEF 25-75 0.43 L (80%) negative bronchodilator response TLC 4.32 L (97%) RV 128% DLCO corrected 23% CTA CHEST 10/25:  Personally reviewed by me. No pulmonary emboli. No pathologic mediastinal adenopathy. No pericardial effusion. No pleural effusion or thickening. No parenchymal mass or opacity appreciated. Centrilobular emphysema noted throughout both lungs without a clear predilection for the apices.  SEROLOGIES (05/01/17): ANA:  Negative RA:  <14 ESR:  1  MICROBIOLOGY: Blood Cultures x2 10/25 >>>  ANTIBIOTICS: Azithromycin 10/25 (x1 dose)  ASSESSMENT/PLAN:  46 y.o. female with underlying moderate-severe COPD based upon spirometry from August. At that time she also had evidence of air trapping with her lung volumes. No substantial/severe decrease in her carbon monoxide diffusion capacity is likely due to at least in part to her significant underlying centrilobular emphysema. This appears not  only in the apices but also the bases. She does have ongoing tobacco use which is of great concern given the severity of her lung disease and comorbidities. She does seem somewhat dehydrated and hemoconcentrated reviewing her cell count today. With her hot flashes I cannot totally discount the possibility of an infectious process or viral prodrome given her upper abdominal discomfort. As such, I'm investigating for further infectious etiologies to her acute decompensation. With her social situation anxiety is certainly a potential contributing factor.  1. Acute on chronic hypoxic respiratory failure: Patient has now returned to her baseline oxygen requirement. Checking respiratory  viral panel PCR. Initiating droplet isolation. 2. Moderate-severe COPD with centrilobular emphysema: Ordering alpha-1 antitrypsin phenotype. Switching from Spiriva and Dulera to Duonebs scheduled every 6 hours and Pulmicort 0.25 mg nebulized twice daily. Holding on steroid and antibiotic therapy at this time. 3. Pulmonary hypertension: Patient possibly somewhat over diuresed. Holding Zaroxolyn. Continuing Lasix and Aldactone. Gentle IV fluid rehydration with normal saline at 50 mL per hour for the next 10 hours given recent dye load. 4. Tobacco use disorder: Recommended the patient fully abstain from tobacco use. Nicotine replacement with patch already ordered by admitting physician.  Remainder of care as per primary service and other consultants.  Sonia Baller Ashok Cordia, M.D. [facility] Pager:  [phone] After 7pm or if no response, call [phone] 6:54 PM 10/23/17

## 2017-10-23 NOTE — Consult Note (Signed)
Shannon Schneider is a 46 y.o. female  [id number]  Primary Cardiologist: [name] Reason for Consultation:SOB and STEMI ruled out  HPI: 66 YOBF presented to ER with SOB, weakness and feeling hot.   Review of Systems: NO chest pain  Past Medical History:  Diagnosis Date  . Congestive heart failure (CHF) ([name]) 06/27/15  . COPD (chronic obstructive pulmonary disease) ([name]) 06/27/15  . Hyperbilirubinemia   . 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 Rectal mass   . Sickle cell anemia (HCC)      (Not in a hospital admission)     Infusions: . sodium chloride 10 mL/hr at 10/23/17 0936  . azithromycin (ZITHROMAX) 500 MG IVPB 500 mg (10/23/17 0946)  . vancomycin 1,000 mg (10/23/17 0931)    No Known Allergies  Social History   Social History  . Marital status: Married    Spouse name: N/A  . Number of children: N/A  . Years of education: N/A   Occupational History  . Not on file.   Social History Main Topics  . Smoking status: Current Every Day Smoker    Packs/day: 0.25    Years: 20.00    Types: Cigarettes  . Smokeless tobacco: Never Used  . Alcohol use 0.0 oz/week     Comment: occasionally  . Drug use: No  . Sexual activity: Not on file   Other Topics Concern  . Not on file   Social History Narrative   Independent at baseline    Family History  Problem Relation Age of Onset  . Renal Disease Mother   . Diabetes Mother   . Hypertension Mother   . Emphysema Father   . Breast cancer Neg Hx     PHYSICAL EXAM: Vitals:   10/23/17 0930 10/23/17 0938  BP:  91/65  Pulse: 72 69  Resp: 14 13  Temp:    SpO2: 94% 100%    No intake or output data in the 24 hours ending 10/23/17 1007  General:  Well appearing. No respiratory difficulty HEENT: normal Neck: supple. no JVD. Carotids 2+ bilat; no bruits. No lymphadenopathy or thryomegaly appreciated. Cor: PMI nondisplaced. Regular rate & rhythm. No rubs, gallops or murmurs. Lungs: clear Abdomen: soft, nontender,  nondistended. No hepatosplenomegaly. No bruits or masses. Good bowel sounds. Extremities: no cyanosis, clubbing, rash, edema Neuro: alert & oriented x 3, cranial nerves grossly intact. moves all 4 extremities w/o difficulty. Affect pleasant.  ECG: NSR nonspecific st and t changess  Results for orders placed or performed during the hospital encounter of 10/23/17 (from the past 24 hour(s))  CBC with Differential     Status: Abnormal   Collection Time: 10/23/17  7:22 AM  Result Value Ref Range   WBC 19.7 (H) 3.6 - 11.0 K/uL   RBC 3.19 (L) 3.80 - 5.20 MIL/uL   Hemoglobin 11.7 (L) 12.0 - 16.0 g/dL   HCT 33.3 (L) 35.0 - 47.0 %   MCV 104.4 (H) 80.0 - 100.0 fL   MCH 36.7 (H) 26.0 - 34.0 pg   MCHC 35.1 32.0 - 36.0 g/dL   RDW 21.6 (H) 11.5 - 14.5 %   Platelets 489 (H) 150 - 440 K/uL   Neutrophils Relative % 73 %   Lymphocytes Relative 24 %   Monocytes Relative 2 %   Eosinophils Relative 1 %   Basophils Relative 0 %   Band Neutrophils 0 %   Metamyelocytes Relative 0 %   Myelocytes 0 %   Promyelocytes Absolute 0 %  Blasts 0 %   nRBC 3 (H) 0 /100 WBC   Other 0 %   Neutro Abs 14.4 (H) 1.4 - 6.5 K/uL   Lymphs Abs 4.7 (H) 1.0 - 3.6 K/uL   Monocytes Absolute 0.4 0.2 - 0.9 K/uL   Eosinophils Absolute 0.2 0 - 0.7 K/uL   Basophils Absolute 0.0 0 - 0.1 K/uL   RBC Morphology SICKLE CELLS    WBC Morphology VACUOLATED NEUTROPHILS    Smear Review      PLATELET CLUMPS NOTED ON SMEAR, COUNT APPEARS ADEQUATE  Basic metabolic panel     Status: Abnormal   Collection Time: 10/23/17  7:22 AM  Result Value Ref Range   Sodium 137 135 - 145 mmol/L   Potassium 3.9 3.5 - 5.1 mmol/L   Chloride 100 (L) 101 - 111 mmol/L   CO2 26 22 - 32 mmol/L   Glucose, Bld 108 (H) 65 - 99 mg/dL   BUN 31 (H) 6 - 20 mg/dL   Creatinine, Ser 1.28 (H) 0.44 - 1.00 mg/dL   Calcium 10.2 8.9 - 10.3 mg/dL   GFR calc non Af Amer 49 (L) >60 mL/min   GFR calc Af Amer 57 (L) >60 mL/min   Anion gap 11 5 - 15  Troponin I      Status: None   Collection Time: 10/23/17  7:22 AM  Result Value Ref Range   Troponin I <0.03 <0.03 ng/mL  Brain natriuretic peptide     Status: Abnormal   Collection Time: 10/23/17  7:22 AM  Result Value Ref Range   B Natriuretic Peptide 141.0 (H) 0.0 - 100.0 pg/mL  Protime-INR     Status: None   Collection Time: 10/23/17  7:22 AM  Result Value Ref Range   Prothrombin Time 13.5 11.4 - 15.2 seconds   INR 1.04   APTT     Status: None   Collection Time: 10/23/17  7:22 AM  Result Value Ref Range   aPTT 28 24 - 36 seconds  Digoxin level     Status: None   Collection Time: 10/23/17  7:22 AM  Result Value Ref Range   Digoxin Level 0.9 0.8 - 2.0 ng/mL  Lipid panel     Status: Abnormal   Collection Time: 10/23/17  7:22 AM  Result Value Ref Range   Cholesterol 175 0 - 200 mg/dL   Triglycerides 151 (H) <150 mg/dL   HDL 39 (L) >40 mg/dL   Total CHOL/HDL Ratio 4.5 RATIO   VLDL 30 0 - 40 mg/dL   LDL Cholesterol 106 (H) 0 - 99 mg/dL  Lactic acid, plasma     Status: None   Collection Time: 10/23/17  8:53 AM  Result Value Ref Range   Lactic Acid, Venous 1.5 0.5 - 1.9 mmol/L   Dg Chest 1 View  Result Date: 10/23/2017 CLINICAL DATA:  Shortness of breath, COPD, smoker EXAM: CHEST 1 VIEW COMPARISON:  04/21/2017 FINDINGS: There is hyperinflation of the lungs compatible with COPD. Interstitial opacities again noted in the lower lobes, likely edema. Suspect small bilateral effusions, bibasilar atelectasis. Mild cardiomegaly. IMPRESSION: Cardiomegaly with interstitial opacities in the lower lobes, likely mild edema/ CHF. COPD. Bibasilar atelectasis, small effusions. Electronically Signed   By: Rolm Baptise M.D.   On: 10/23/2017 08:06   Ct Angio Chest Pe W And/or Wo Contrast  Result Date: 10/23/2017 CLINICAL DATA:  46 year old with respiratory distress. High pretest probability for pulmonary embolism. History sickle cell anemia. EXAM: CT ANGIOGRAPHY CHEST WITH CONTRAST TECHNIQUE:  Multidetector CT  imaging of the chest was performed using the standard protocol during bolus administration of intravenous contrast. Multiplanar CT image reconstructions and MIPs were obtained to evaluate the vascular anatomy. CONTRAST:  75 mL Isovue 370 COMPARISON:  Chest radiograph 10/23/2017 FINDINGS: Cardiovascular: Main pulmonary artery is prominent for size measuring up to 3.7 cm. Pulmonary arteries are patent without pulmonary emboli. Normal caliber of the thoracic aorta. Minimal atherosclerotic calcifications at the aortic arch. Heart size is mildly enlarged. Mediastinum/Nodes: No chest lymphadenopathy. Esophagus is unremarkable. Small amount of pericardial fluid. Lungs/Pleura: No pleural effusions. Trachea and mainstem bronchi are patent. Centrilobular emphysema particularly in the right upper lobe. Linear and bandlike densities in the right middle lobe are suggestive for atelectasis or scarring. Peripheral densities in both lower lobes may also represent scarring or atelectasis. Patchy areas of ground-glass attenuation in both lungs. Upper Abdomen: The spleen is atrophic and highly dense. 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 Musculoskeletal: No suspicious bone findings. Review of the MIP images confirms the above findings. IMPRESSION: Negative for a pulmonary embolism. Centrilobular emphysema with patchy areas of ground-glass attenuation. Findings are nonspecific but could represent mild edema. Peripheral densities in the right middle lobe and lower lobes are most compatible with scarring and/or atelectasis. No large areas of airspace disease or consolidation. Enlargement of the main pulmonary artery. Findings are suggestive for pulmonary hypertension. Aortic Atherosclerosis (ICD10-I70.0). Electronically Signed   By: Markus Daft M.D.   On: 10/23/2017 08:57     ASSESSMENT AND PLAN: Severe SOB with no chest pain and non-specific st and t changes on EKG and MI ruled out and no chest  pain. Agree with treatment of SSD. She had stress test 3/18 which showed no ischaemia and echo had normal LVEF and moderate MR. Advise f/u office after d/c from [facility] long.  Asante Blanda A

## 2017-10-23 NOTE — Progress Notes (Addendum)
Patient c/o SOB.  Patient appears to have dyspnea at rest.  Patient 02 sats on 4L were 84% at this time.  Patient c/o tightness in lower abdomen radiating to her back. Patient requested 'oxygen mask'.  Respiratory called for breathing treatment, Dr. Zigmund Daniel notified.  Patient placed on 6L 02 via [place] to bring her back above 90 % 02.  After breathing treatment patient placed back on 4L, sats and sats were 91%.  Patient still c/o being unable to take a deep breath and having a lot of 'tightness' in abdomen/legs.  Dr. Zigmund Daniel on floor to see patient.  1 dose ativan ordered.  Patient still appeared anxious and c/o SOB.  Sats were steady at 91% at this time.  1 more dose of ativan was ordered and administered.  Patient appears to be resting comfortably at this time and does not appear dyspneic. Patient connected to continuous 02 sat monitor on telemetry box and CMT notified.  Will continue to monitor.

## 2017-10-23 NOTE — H&P (Signed)
Hospital Admission Note Date: 10/23/2017  Patient name: Shannon Schneider Medical record number: [id number] Date of birth: Feb 11, 1971 Age: 46 y.o. Gender: female PCP: Danelle Berry, NP  Attending physician: Leana Gamer, MD  Chief Complaint: Intermittent palpitations accompanied by hot flashes x 3 days  History of Present Illness: This is an opiate naive patient with COPD, A-fibrillation, Cardiac valvular disease, Chronic respiratory failure with hypoxia (baselien 4 l/min) and Hb SS with HPFH who reports that beginning for days ago she started having intermittent palpitations and concurrent difficulty breathing and a feeling of warmth over her entire body. She reports that she continued to have this phenomenon occur intermittently over the last several days and last night had the same thing occurring so she went to the ED. In the ED she had a CXR which was essentially unremarkable except for atelectasis and small effusions. A CTA was also performed to evaluate for PE although she was at baseline hypoxia and showed emphysema and ground glass appearance that could represent edema. The patient denies any fevers, chills, chest wall pain, cough or dizziness.   Scheduled Meds: . cholecalciferol  2,000 Units Oral Daily  . citalopram  10 mg Oral QHS  . digoxin  0.125 mg Oral Daily  . enoxaparin (LOVENOX) injection  40 mg Subcutaneous Q24H  . furosemide  40 mg Oral BID  . loratadine  10 mg Oral Daily  . midodrine  2.5 mg Oral TID WC  . mometasone-formoterol  2 puff Inhalation BID  . pantoprazole  40 mg Oral Daily  . sodium chloride flush  3 mL Intravenous Q12H  . spironolactone  12.5 mg Oral Daily  . tiotropium  18 mcg Inhalation Daily   Continuous Infusions: . sodium chloride     PRN Meds:.sodium chloride, albuterol, senna-docusate, sodium chloride flush Allergies: Patient has no known allergies. Past Medical History:  Diagnosis Date  . Atrial fibrillation ([place])   . Chronic respiratory  failure with hypoxia ([place])   . Congestive heart failure (CHF) ([place]) 06/27/15  . COPD (chronic obstructive pulmonary disease) ([place]) 06/27/15  . Emphysema of lung ([place])   . Hyperbilirubinemia   . Mitral regurgitation   . Moderate tricuspid regurgitation   . 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 Pulmonary hypertension assoc with unclear multi-factorial mechanisms ([place]) 2013  . Rectal mass   . Sickle cell anemia (HCC)   . Tobacco use disorder    Past Surgical History:  Procedure Laterality Date  . CESAREAN SECTION  1989  . CHOLECYSTECTOMY  1996  . EUS N/A 07/13/2015   Procedure: LOWER ENDOSCOPIC ULTRASOUND (EUS);  Surgeon: Cora Daniels, MD;  Location: [facility] ENDOSCOPY;  Service: Endoscopy;  Laterality: N/A;   Family History  Problem Relation Age of Onset  . Renal Disease Mother   . Diabetes Mother   . Hypertension Mother   . Emphysema Father   . Breast cancer Neg Hx    Social History   Social History  . Marital status: Married    Spouse name: N/A  . Number of children: N/A  . Years of education: N/A   Occupational History  . Not on file.   Social History Main Topics  . Smoking status: Current Every Day Smoker    Packs/day: 0.25    Years: 20.00    Types: Cigarettes  . Smokeless tobacco: Never Used  . Alcohol use 0.0 oz/week     Comment: occasionally  . Drug use: No  . Sexual activity: Not on file   Other  Topics Concern  . Not on file   Social History Narrative   Independent at baseline   Review of Systems: Pertinent items noted in HPI and remainder of comprehensive ROS otherwise negative. Physical Exam:  Intake/Output Summary (Last 24 hours) at 10/23/17 1349 Last data filed at 10/23/17 1200  Gross per 24 hour  Intake                0 ml  Output             1000 ml  Net            -1000 ml   General: Alert, awake, oriented x3, in no acute distress.  HEENT: [place]/AT PEERL, EOMI, anicteric Neck: Trachea midline,  no masses, no thyromegal,y no JVD, no carotid  bruit OROPHARYNX:  Moist, No exudate/ erythema/lesions.  Heart: Regular rate and rhythm, II/VI systolic ejection murmur at 2nd intercostal space on right and left. No rubs or gallops, PMI non-displaced, no heaves or thrills on palpation.  Lungs: Clear to auscultation, no wheezing or rhonchi noted. No increased vocal fremitus resonant to percussion  Abdomen: Soft, nontender, nondistended, positive bowel sounds, no masses no hepatosplenomegaly noted.  Neuro: No focal neurological deficits noted cranial nerves II through XII grossly intact. Strength at baseline in bilateral upper and lower extremities. Musculoskeletal: No warmth swelling or erythema around joints, no spinal tenderness noted. Psychiatric: Patient alert and oriented x3, good insight and cognition, good recent to remote recall.   Lab results:  Recent Labs  10/23/17 0722  NA 137  K 3.9  CL 100*  CO2 26  GLUCOSE 108*  BUN 31*  CREATININE 1.28*  CALCIUM 10.2   No results for input(s): AST, ALT, ALKPHOS, BILITOT, PROT, ALBUMIN in the last 72 hours. No results for input(s): LIPASE, AMYLASE in the last 72 hours.  Recent Labs  10/23/17 0722  WBC 19.7*  NEUTROABS 14.4*  HGB 11.7*  HCT 33.3*  MCV 104.4*  PLT 489*    Recent Labs  10/23/17 0722  TROPONINI <0.03   Invalid input(s): POCBNP No results for input(s): DDIMER in the last 72 hours. No results for input(s): HGBA1C in the last 72 hours.  Recent Labs  10/23/17 0722  CHOL 175  HDL 39*  LDLCALC 106*  TRIG 151*  CHOLHDL 4.5   No results for input(s): TSH, T4TOTAL, T3FREE, THYROIDAB in the last 72 hours.  Invalid input(s): FREET3 No results for input(s): VITAMINB12, FOLATE, FERRITIN, TIBC, IRON, RETICCTPCT in the last 72 hours. Imaging results:  Dg Chest 1 View  Result Date: 10/23/2017 CLINICAL DATA:  Shortness of breath, COPD, smoker EXAM: CHEST 1 VIEW COMPARISON:  04/21/2017 FINDINGS: There is hyperinflation of the lungs compatible with COPD.  Interstitial opacities again noted in the lower lobes, likely edema. Suspect small bilateral effusions, bibasilar atelectasis. Mild cardiomegaly. IMPRESSION: Cardiomegaly with interstitial opacities in the lower lobes, likely mild edema/ CHF. COPD. Bibasilar atelectasis, small effusions. Electronically Signed   By: Rolm Baptise M.D.   On: 10/23/2017 08:06   Ct Angio Chest Pe W And/or Wo Contrast  Result Date: 10/23/2017 CLINICAL DATA:  46 year old with respiratory distress. High pretest probability for pulmonary embolism. History sickle cell anemia. EXAM: CT ANGIOGRAPHY CHEST WITH CONTRAST TECHNIQUE: Multidetector CT imaging of the chest was performed using the standard protocol during bolus administration of intravenous contrast. Multiplanar CT image reconstructions and MIPs were obtained to evaluate the vascular anatomy. CONTRAST:  75 mL Isovue 370 COMPARISON:  Chest radiograph 10/23/2017 FINDINGS: Cardiovascular: Main  pulmonary artery is prominent for size measuring up to 3.7 cm. Pulmonary arteries are patent without pulmonary emboli. Normal caliber of the thoracic aorta. Minimal atherosclerotic calcifications at the aortic arch. Heart size is mildly enlarged. Mediastinum/Nodes: No chest lymphadenopathy. Esophagus is unremarkable. Small amount of pericardial fluid. Lungs/Pleura: No pleural effusions. Trachea and mainstem bronchi are patent. Centrilobular emphysema particularly in the right upper lobe. Linear and bandlike densities in the right middle lobe are suggestive for atelectasis or scarring. Peripheral densities in both lower lobes may also represent scarring or atelectasis. Patchy areas of ground-glass attenuation in both lungs. Upper Abdomen: The spleen is atrophic and highly dense. 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 Musculoskeletal: No suspicious bone findings. Review of the MIP images confirms the above findings. IMPRESSION: Negative for a pulmonary  embolism. Centrilobular emphysema with patchy areas of ground-glass attenuation. Findings are nonspecific but could represent mild edema. Peripheral densities in the right middle lobe and lower lobes are most compatible with scarring and/or atelectasis. No large areas of airspace disease or consolidation. Enlargement of the main pulmonary artery. Findings are suggestive for pulmonary hypertension. Aortic Atherosclerosis (ICD10-I70.0). Electronically Signed   By: Markus Daft M.D.   On: 10/23/2017 08:57   Other results: EKG: normal sinus rhythm, unchanged from previous tracings, nonspecific ST and T waves changes.   Assessment and Plan: 1. Palpitations: Will place on Telemetry and monitor for any intermittent tachyarrhytmias 2. Atrial Fibrillation: EKG from this morning showed sinus rhythm. Will continue Lanoxin   3. Renal Insufficiency: Cr on 04/28/17 was 0.99 according to notes from [facility]. However trend over the last several months unknown.  4. Chronic Respiratory Failure with Hypoxia: continue on 4 L/minof Oxygen. 5. Tobacco Use Disorder: Nicotine transdermal  6. A-V valvular disease with Diastolic CHF: No evidence of decompensated state.  7. Emphysema: Continue Chronic medications 8. Hb SS with HPFH: Currently no c/o pain. Will treat with Tylenol and Toradol if pain develops.  9. H/O Hypotension: Continue Midodrine.  10. H/O Pulmonary Hypertension  Pt placed on observation status. In excess of 75 minutes spent during this encounter. Greater than 50% spent in face to face contact, counseling and coordination of care.    Odis Turck A. 10/23/2017, 1:49 PM    Lue Sykora A.  Pager [phone]. If 7PM-7AM, please contact night-coverage.

## 2017-10-23 NOTE — ED Triage Notes (Signed)
Pt to ED via EMS from home with c/o resp distress xfew hours, Pt hx of copd and asthma. Per EMS pt was mid 67s on RA upon arrival, clear lung sounds. Pt on 5L at 92%, MD at bedside

## 2017-10-23 NOTE — ED Notes (Signed)
ALL  PAPERWORK  GIVEN  TO  [facility]

## 2017-10-23 NOTE — ED Notes (Signed)
Cardiologist [place] at bedside

## 2017-10-23 NOTE — ED Notes (Signed)
Code sepsis called to [place]

## 2017-10-23 NOTE — ED Provider Notes (Addendum)
[facility] Emergency Department Provider Note  ____________________________________________   First MD Initiated Contact with Patient 10/23/17 0710     (approximate)  I have reviewed the triage vital signs and the nursing notes.   HISTORY  Chief Complaint Respiratory Distress   HPI STEFAN KAREN is a 46 y.o. female with a history of COPD as well as sickle cell anemia and congestive heart failure who is presenting to the emergency department today for shortness of breath starting several hours ago. She is denying any chest pain. Found by first responders to have an oxygen saturation in the 70s and was started on a nonrebreather mask and then transitioned down to 4 L of nasal cannula oxygen which kept her in the 80s to 90s. Wheezing was not noted in route. Patient denies cough or fever. No known sick contacts.   Past Medical History:  Diagnosis Date  . Congestive heart failure (CHF) ([place]) 06/27/15  . COPD (chronic obstructive pulmonary disease) ([place]) 06/27/15  . Hyperbilirubinemia   . 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 Rectal mass   . Sickle cell anemia [facility])     Patient Active Problem List   Diagnosis Date Noted  . Ascites   . Increased liver enzymes   . Acute on chronic respiratory failure with hypoxia ([place])   . Moderate COPD (chronic obstructive pulmonary disease) ([place])   . Moderate to severe pulmonary hypertension ([place])   . Hb-SS disease without crisis ([place])   . Cor pulmonale (chronic) ([place])   . Elevated troponin   . CHF (congestive heart failure) ([place]) 03/12/2017  . Shortness of breath   . Hyponatremia 12/30/2016  . Mass of perirectal soft tissue 06/22/2015    Past Surgical History:  Procedure Laterality Date  . CESAREAN SECTION  1989  . CHOLECYSTECTOMY  1996  . EUS N/A 07/13/2015   Procedure: LOWER ENDOSCOPIC ULTRASOUND (EUS);  Surgeon: Cora Daniels, MD;  Location: [facility] ENDOSCOPY;  Service: Endoscopy;  Laterality: N/A;    Prior to  Admission medications   Medication Sig Start Date End Date Taking? Authorizing Provider  albuterol (PROVENTIL HFA;VENTOLIN HFA) 108 (90 Base) MCG/ACT inhaler Inhale 2 puffs into the lungs every 6 (six) hours as needed for wheezing. 09/16/16   [provider]  citalopram (CELEXA) 10 MG tablet Take 10 mg by mouth at bedtime.    [provider]  digoxin (LANOXIN) 0.125 MG tablet Take 1 tablet (0.125 mg total) by mouth daily. 04/22/17   Hillary Bow, MD  Ergocalciferol 2000 units TABS Take 2,000 Units by mouth daily.     [provider]  furosemide (LASIX) 40 MG tablet Take 1 tablet (40 mg total) by mouth 2 (two) times daily. 04/24/17   Hillary Bow, MD  loratadine (CLARITIN) 10 MG tablet Take 10 mg by mouth daily.    [provider]  metolazone (ZAROXOLYN) 2.5 MG tablet Take 2 tablets (5 mg total) by mouth daily. Patient not taking: Reported on 08/29/2017 04/24/17   Hillary Bow, MD  metoprolol tartrate (LOPRESSOR) 25 MG tablet Take 0.5 tablets (12.5 mg total) by mouth 2 (two) times daily. Patient not taking: Reported on 08/29/2017 04/21/17   Hillary Bow, MD  midodrine (PROAMATINE) 2.5 MG tablet Take 1 tablet (2.5 mg total) by mouth 3 (three) times daily with meals. 04/24/17   Hillary Bow, MD  mometasone-formoterol (DULERA) 200-5 MCG/ACT AERO Inhale 2 puffs into the lungs 2 (two) times daily. 03/17/17   Loletha Grayer, MD  oxyCODONE 10 MG TABS  Take 1 tablet (10 mg total) by mouth every 6 (six) hours as needed for moderate pain or severe pain (pain). 04/21/17   Hillary Bow, MD  OXYGEN Inhale 4 L into the lungs continuous.     [provider]  rivaroxaban (XARELTO) 20 MG TABS tablet Take 1 tablet (20 mg total) by mouth daily. Patient not taking: Reported on 08/29/2017 03/17/17   Loletha Grayer, MD  spironolactone (ALDACTONE) 25 MG tablet Take 25 mg by mouth daily.    [provider]  Tiotropium Bromide Monohydrate (SPIRIVA RESPIMAT) 2.5  MCG/ACT AERS Inhale 1 puff into the lungs 2 (two) times daily. 05/13/16   [provider]    Allergies Patient has no known allergies.  Family History  Problem Relation Age of Onset  . Renal Disease Mother   . Diabetes Mother   . Hypertension Mother   . Emphysema Father   . Breast cancer Neg Hx     Social History Social History  Substance Use Topics  . Smoking status: Current Every Day Smoker    Packs/day: 0.25    Years: 20.00    Types: Cigarettes  . Smokeless tobacco: Never Used  . Alcohol use 0.0 oz/week     Comment: occasionally    Review of Systems  Constitutional: No fever/chills Eyes: No visual changes. ENT: No sore throat. Cardiovascular: Denies chest pain. Respiratory:as above Gastrointestinal: No abdominal pain.  No nausea, no vomiting.  No diarrhea.  No constipation. Genitourinary: Negative for dysuria. Musculoskeletal: Negative for back pain. Skin: Negative for rash. Neurological: Negative for headaches, focal weakness or numbness.   ____________________________________________   PHYSICAL EXAM:  VITAL SIGNS: ED Triage Vitals  Enc Vitals Group     BP 10/23/17 0707 134/87     Pulse Rate 10/23/17 0707 81     Resp 10/23/17 0707 (!) 22     Temp 10/23/17 0737 98.4 F (36.9 C)     Temp Source 10/23/17 0737 Oral     SpO2 10/23/17 0707 90 %     Weight 10/23/17 0736 128 lb 11.2 oz (58.4 kg)     Height 10/23/17 0736 5\' 5"  (1.651 m)     Head Circumference --      Peak Flow --      Pain Score 10/23/17 0707 0     Pain Loc --      Pain Edu? --      Excl. in [place]? --     Constitutional: Alert and oriented. Speaks in full sentences but with labored respirations. Wearing nasal cannula oxygen. Eyes: Conjunctivae are normal.  Head: Atraumatic. Nose: No congestion/rhinnorhea. Mouth/Throat: Mucous membranes are moist.  Neck: No stridor.   Cardiovascular: Normal rate, regular rhythm. Grossly normal heart sounds.   Respiratory: Increased work of  breathing.  No retractions. Lungs CTAB but with a prolonged history phase. Gastrointestinal: Soft and nontender. No distention.  Musculoskeletal: No lower extremity tenderness nor edema.  No joint effusions. Neurologic:  Normal speech and language. No gross focal neurologic deficits are appreciated. Skin:  Skin is warm, dry and intact. No rash noted. Psychiatric: Mood and affect are normal. Speech and behavior are normal.  ____________________________________________   LABS (all labs ordered are listed, but only abnormal results are displayed)  Labs Reviewed  CBC WITH DIFFERENTIAL/PLATELET  BASIC METABOLIC PANEL  TROPONIN I  BRAIN NATRIURETIC PEPTIDE  PROTIME-INR  APTT  DIGOXIN LEVEL  LIPID PANEL   ____________________________________________  EKG  ED ECG REPORT I, Doran Stabler, the attending  physician, personally viewed and interpreted this ECG.   Date: 10/23/2017  EKG Time: 0717  Rate: 67  Rhythm: normal sinus rhythm with PACs  Axis: Normal  Intervals:none  ST&T Change: ST segment depressions of 1-2 mm in V2, V3 without elevation inferiorly.  ED ECG REPORT I, Doran Stabler, the attending physician, personally viewed and interpreted this ECG.   Date: 10/23/2017, posterior EKG  EKG Time: 0744  Rate: 86  Rhythm: normal sinus rhythm  Axis: Normal  Intervals:none  ST&T Change: No obvious ST elevation or depression. However very poor baseline. To repeat.  ED ECG REPORT I, Doran Stabler, the attending physician, personally viewed and interpreted this ECG.   Date: 10/23/2017, posterior EKG  EKG Time: 0751  Rate: 68  Rhythm: normal sinus rhythm  Axis: Normal  Intervals:none  ST&T Change: T wave inversions in aVL, V2 through V5. Poor baseline in V6. Minimal inferior elevation. EKG machine read a STEMI. However, reviewed with cardiology who disagrees. Lead a 1 on functional.  ED ECG REPORT I, [place],  Youlanda Roys, the attending physician,  personally viewed and interpreted this ECG.   Date: 10/23/2017  EKG Time: 0752, posterior EKG  Rate: 67  Rhythm: normal sinus rhythm  Axis: Normal  Intervals:none  ST&T Change: Minimal ST depression in V3. Lead a one is nonfunctional.   ____________________________________________  RADIOLOGY  Atelectasis versus edema on both the chest x-ray and CT of the chest. No pulmonary was found on the CT of the chest. ____________________________________________   PROCEDURES  Procedure(s) performed:   Procedures  Critical Care performed:  CRITICAL CARE Performed by: Doran Stabler   Total critical care time: 35 minutes  Critical care time was exclusive of separately billable procedures and treating other patients.  Critical care was necessary to treat or prevent imminent or life-threatening deterioration.  Critical care was time spent personally by me on the following activities: development of treatment plan with patient and/or surrogate as well as nursing, discussions with consultants, evaluation of patient's response to treatment, examination of patient, obtaining history from patient or surrogate, ordering and performing treatments and interventions, ordering and review of laboratory studies, ordering and review of radiographic studies, pulse oximetry and re-evaluation of patient's condition.   ____________________________________________   INITIAL IMPRESSION / ASSESSMENT AND PLAN / ED COURSE  Pertinent labs & imaging results that were available during my care of the patient were reviewed by me and considered in my medical decision making (see chart for details).  Differential includes, but is not limited to, viral syndrome, bronchitis including COPD exacerbation, pneumonia, reactive airway disease including asthma, CHF including exacerbation with or without pulmonary/interstitial edema, pneumothorax, ACS, thoracic trauma, and pulmonary embolism.  As part of my medical  decision making, I reviewed the following data within the [place] chart reviewed     ----------------------------------------- 7:41 AM on 10/23/2017 -----------------------------------------  STEMI alert called. Discussed the case with Dr. Clayborn Bigness who says he is on his way.  ----------------------------------------- 9:06 AM on 10/23/2017 -----------------------------------------  Both Dr. Clayborn Bigness and Dr. Humphrey Rolls reviewed the patient's EKGs. Dr. Clayborn Bigness saw the patient at the bedside. Patient will not be rushed to the Cath Lab at this time. Patient maintaining on her nasal cannula oxygen of 4 L. White blood cell count of 19. Consideration for acute chest syndrome. Antibiotics hadn't. I discussed this with Dr. Zigmund Daniel at [facility] who is accepting the patient but after review of the patient's history as well as  imaging she does not believe the patient has acute chest. However, she will except the patient onto her service and believes that we should be Humphrey Rolls stream on the patient's COPD. I added albuterol this time as well as steroids. The patient will be transferred to [facility]. The patient is understanding the plan of 1 to comply. Dr. Zigmund Daniel requests azithromycin as the antibiotic of choice.      ____________________________________________   FINAL CLINICAL IMPRESSION(S) / ED DIAGNOSES  COPD exacerbation. Hypoxia.    NEW MEDICATIONS STARTED DURING THIS VISIT:  New Prescriptions   No medications on file     Note:  This document was prepared using Dragon voice recognition software and may include unintentional dictation errors.     Orbie Pyo, MD 10/23/17 4709    Orbie Pyo, MD 10/23/17 [phone]

## 2017-10-24 DIAGNOSIS — Z7951 Long term (current) use of inhaled steroids: Secondary | ICD-10-CM | POA: Diagnosis not present

## 2017-10-24 DIAGNOSIS — F419 Anxiety disorder, unspecified: Secondary | ICD-10-CM | POA: Diagnosis present

## 2017-10-24 DIAGNOSIS — D5700 Hb-SS disease with crisis, unspecified: Secondary | ICD-10-CM | POA: Diagnosis present

## 2017-10-24 DIAGNOSIS — Z9981 Dependence on supplemental oxygen: Secondary | ICD-10-CM | POA: Diagnosis not present

## 2017-10-24 DIAGNOSIS — D72829 Elevated white blood cell count, unspecified: Secondary | ICD-10-CM

## 2017-10-24 DIAGNOSIS — T508X5A Adverse effect of diagnostic agents, initial encounter: Secondary | ICD-10-CM | POA: Diagnosis present

## 2017-10-24 DIAGNOSIS — N179 Acute kidney failure, unspecified: Secondary | ICD-10-CM | POA: Diagnosis present

## 2017-10-24 DIAGNOSIS — Z8249 Family history of ischemic heart disease and other diseases of the circulatory system: Secondary | ICD-10-CM | POA: Diagnosis not present

## 2017-10-24 DIAGNOSIS — J9621 Acute and chronic respiratory failure with hypoxia: Secondary | ICD-10-CM | POA: Diagnosis not present

## 2017-10-24 DIAGNOSIS — J441 Chronic obstructive pulmonary disease with (acute) exacerbation: Secondary | ICD-10-CM | POA: Diagnosis present

## 2017-10-24 DIAGNOSIS — I5030 Unspecified diastolic (congestive) heart failure: Secondary | ICD-10-CM | POA: Diagnosis present

## 2017-10-24 DIAGNOSIS — F17210 Nicotine dependence, cigarettes, uncomplicated: Secondary | ICD-10-CM | POA: Diagnosis present

## 2017-10-24 DIAGNOSIS — Z9049 Acquired absence of other specified parts of digestive tract: Secondary | ICD-10-CM | POA: Diagnosis not present

## 2017-10-24 DIAGNOSIS — I272 Pulmonary hypertension, unspecified: Secondary | ICD-10-CM | POA: Diagnosis present

## 2017-10-24 DIAGNOSIS — T501X5A Adverse effect of loop [high-ceiling] diuretics, initial encounter: Secondary | ICD-10-CM | POA: Diagnosis present

## 2017-10-24 DIAGNOSIS — Z79899 Other long term (current) drug therapy: Secondary | ICD-10-CM | POA: Diagnosis not present

## 2017-10-24 DIAGNOSIS — I081 Rheumatic disorders of both mitral and tricuspid valves: Secondary | ICD-10-CM | POA: Diagnosis present

## 2017-10-24 DIAGNOSIS — I4891 Unspecified atrial fibrillation: Secondary | ICD-10-CM | POA: Diagnosis present

## 2017-10-24 DIAGNOSIS — E860 Dehydration: Secondary | ICD-10-CM | POA: Diagnosis present

## 2017-10-24 DIAGNOSIS — I429 Cardiomyopathy, unspecified: Secondary | ICD-10-CM | POA: Diagnosis present

## 2017-10-24 DIAGNOSIS — R0602 Shortness of breath: Secondary | ICD-10-CM | POA: Diagnosis present

## 2017-10-24 LAB — BASIC METABOLIC PANEL
ANION GAP: 10 (ref 5–15)
BUN: 44 mg/dL — AB (ref 6–20)
CALCIUM: 9.9 mg/dL (ref 8.9–10.3)
CHLORIDE: 106 mmol/L (ref 101–111)
CO2: 25 mmol/L (ref 22–32)
Creatinine, Ser: 1.3 mg/dL — ABNORMAL HIGH (ref 0.44–1.00)
GFR calc Af Amer: 56 mL/min — ABNORMAL LOW (ref >60)
GFR, EST NON AFRICAN AMERICAN: 48 mL/min — AB (ref >60)
GLUCOSE: 96 mg/dL (ref 65–99)
POTASSIUM: 3.7 mmol/L (ref 3.5–5.1)
SODIUM: 141 mmol/L (ref 135–145)

## 2017-10-24 LAB — CBC WITH DIFFERENTIAL/PLATELET
Basophils Absolute: 0 K/uL (ref 0.0–0.1)
Basophils Relative: 0 %
Eosinophils Absolute: 0 K/uL (ref 0.0–0.7)
Eosinophils Relative: 0 %
HCT: 28.8 % — ABNORMAL LOW (ref 36.0–46.0)
Hemoglobin: 10.2 g/dL — ABNORMAL LOW (ref 12.0–15.0)
Lymphocytes Relative: 16 %
Lymphs Abs: 3.5 K/uL (ref 0.7–4.0)
MCH: 35.4 pg — ABNORMAL HIGH (ref 26.0–34.0)
MCHC: 35.4 g/dL (ref 30.0–36.0)
MCV: 100 fL (ref 78.0–100.0)
Monocytes Absolute: 2 K/uL — ABNORMAL HIGH (ref 0.1–1.0)
Monocytes Relative: 9 %
Neutro Abs: 16.6 K/uL — ABNORMAL HIGH (ref 1.7–7.7)
Neutrophils Relative %: 75 %
Platelets: 451 K/uL — ABNORMAL HIGH (ref 150–400)
RBC: 2.88 MIL/uL — ABNORMAL LOW (ref 3.87–5.11)
RDW: 21.7 % — ABNORMAL HIGH (ref 11.5–15.5)
WBC: 22.1 K/uL — ABNORMAL HIGH (ref 4.0–10.5)

## 2017-10-24 LAB — RESPIRATORY PANEL BY PCR
Adenovirus: NOT DETECTED
BORDETELLA PERTUSSIS-RVPCR: NOT DETECTED
CHLAMYDOPHILA PNEUMONIAE-RVPPCR: NOT DETECTED
Coronavirus 229E: NOT DETECTED
Coronavirus HKU1: NOT DETECTED
Coronavirus NL63: NOT DETECTED
Coronavirus OC43: NOT DETECTED
INFLUENZA A-RVPPCR: NOT DETECTED
INFLUENZA B-RVPPCR: NOT DETECTED
MYCOPLASMA PNEUMONIAE-RVPPCR: NOT DETECTED
Metapneumovirus: NOT DETECTED
PARAINFLUENZA VIRUS 3-RVPPCR: NOT DETECTED
PARAINFLUENZA VIRUS 4-RVPPCR: NOT DETECTED
Parainfluenza Virus 1: NOT DETECTED
Parainfluenza Virus 2: NOT DETECTED
Respiratory Syncytial Virus: NOT DETECTED
Rhinovirus / Enterovirus: NOT DETECTED

## 2017-10-24 MED ORDER — SODIUM CHLORIDE 0.9 % IV SOLN
INTRAVENOUS | Status: AC
  Administered 2017-10-24 – 2017-10-25 (×2): via INTRAVENOUS

## 2017-10-24 MED ORDER — OXYCODONE HCL 5 MG PO TABS
5.0000 mg | ORAL_TABLET | ORAL | Status: DC | PRN
  Administered 2017-10-24: 5 mg via ORAL
  Filled 2017-10-24: qty 1

## 2017-10-24 MED ORDER — ACETAMINOPHEN 325 MG PO TABS
650.0000 mg | ORAL_TABLET | Freq: Four times a day (QID) | ORAL | Status: DC
  Administered 2017-10-24 – 2017-10-25 (×3): 650 mg via ORAL
  Filled 2017-10-24 (×3): qty 2

## 2017-10-24 MED ORDER — SODIUM CHLORIDE 0.9 % IV BOLUS (SEPSIS)
500.0000 mL | Freq: Once | INTRAVENOUS | Status: AC
  Administered 2017-10-24: 500 mL via INTRAVENOUS

## 2017-10-24 NOTE — Progress Notes (Signed)
SICKLE CELL SERVICE PROGRESS NOTE  Shannon Schneider WUJ:[id number] DOB: 09-Jun-1971 DOA: 10/23/2017 PCP: Danelle Berry, NP  Assessment/Plan: Active Problems:   Pulmonary hypertension assoc with unclear multi-factorial mechanisms (HCC)   Tobacco use disorder   Intermittent palpitations   Moderate tricuspid regurgitation   Mitral regurgitation  1. AKI: This is likely due to the contrast media received yesterday in the setting of added diuretics. Continue to hold Zaroxolyn and continue gentle hydration. Recheck renal function tomorrow. Baseline Cr is 0.99.Will hold lasix for now and consider resuming in a few days. Discontinue Toradol 2. Acute on Chronic Hypoxic respiratory failure with Hypoxia: Resolved. Oxygen requirements back to baseline of 4 l/min of Oxygen.  3. Leukocytosis: Etiology unclear as no evidence of infection. Continue to observe without antibiotics.   4. Pulmonary Hypertension: Pt has moderate pulmonary hypertension and thus needs adequate pre-load to overcome elevated pressures and allow adequate perfusion. Will discontinue Zaroxolyn and she should follow up with her pulmonologist. 5. 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 6. Diastolic and Valvular CHF: Pt has no evidence of de-compensated CHF. What is concerning is that she does not know her dry weight so really does not know the threshold for taking the Zaroxolyn. I have been unable to reach her Cardiologist however a note from her Pulmonologist shows that her weight was 54.1 kg at an office visit on 08/29/2017.  7. Hb SS with pain: Will schedule Tylenol and discontinue Toradol due too AKI. Will give PRN Oxycodone. 8. Tobacco use Disorder: Continue Nicotine transdermal.    Code Status: Full Code Family Communication: N/A Disposition Plan: Change to admitted status  Shannon Alvelo A.  Pager [phone]. If 7PM-7AM, please contact night-coverage.  10/24/2017, 5:09 PM  LOS: 0 days    Interim History: Pt states that she feels a lot better and that the hot flashes have subsided,   Consultants:  Dr. Vickii Penna  Procedures:  None  Antibiotics:  None    Objective: Vitals:   10/24/17 1050 10/24/17 1250 10/24/17 1401 10/24/17 1423  BP:  91/63  98/64  Pulse: 95 84  86  Resp:    18  Temp:  99.3 F (37.4 C)  99.5 F (37.5 C)  TempSrc:  Oral  Oral  SpO2: 94% 94% 93% 91%  Weight:      Height:       Weight change:   Intake/Output Summary (Last 24 hours) at 10/24/17 1709 Last data filed at 10/24/17 1020  Gross per 24 hour  Intake           436.67 ml  Output                0 ml  Net           436.67 ml     Physical Exam General: Alert, awake, oriented x3, in no acute distress.  HEENT: [place]/AT PEERL, EOMI, anicteric Neck: Trachea midline,  no masses, no thyromegal,y no JVD, no carotid bruit OROPHARYNX:  Moist, No exudate/ erythema/lesions.  Heart: Regular rate and rhythm, II/VI systolic ejection murmur at base.  rubs, gallops, PMI non-displaced, no heaves or thrills on palpation.  Lungs: Clear to auscultation, no wheezing or rhonchi noted. No increased vocal fremitus resonant to percussion  Abdomen: Soft, nontender, nondistended, positive bowel sounds, no masses no hepatosplenomegaly noted.  Neuro: No focal neurological deficits noted cranial nerves II through XII grossly intact.  Strength at baseline in bilateral upper and lower extremities. Musculoskeletal: No warmth swelling or erythema  around joints, no spinal tenderness noted. Psychiatric: Patient alert and oriented x3, good insight and cognition, good recent to remote recall.    Data Reviewed: Basic Metabolic Panel:  Recent Labs Lab 10/23/17 0722 10/23/17 1407 10/24/17 1208  NA 137  --  141  K 3.9  --  3.7  CL 100*  --  106  CO2 26  --  25  GLUCOSE 108*  --  96  BUN 31*  --  44*  CREATININE 1.28* 1.13* 1.30*  CALCIUM 10.2  --  9.9   Liver Function Tests: No results for  input(s): AST, ALT, ALKPHOS, BILITOT, PROT, ALBUMIN in the last 168 hours. No results for input(s): LIPASE, AMYLASE in the last 168 hours. No results for input(s): AMMONIA in the last 168 hours. CBC:  Recent Labs Lab 10/23/17 0722 10/23/17 1407 10/24/17 1208  WBC 19.7* 19.7* 22.1*  NEUTROABS 14.4*  --  16.6*  HGB 11.7* 11.0* 10.2*  HCT 33.3* 30.9* 28.8*  MCV 104.4* 99.7 100.0  PLT 489* 496* 451*   Cardiac Enzymes:  Recent Labs Lab 10/23/17 0722  TROPONINI <0.03   BNP (last 3 results)  Recent Labs  04/29/17 0735 05/05/17 0615 10/23/17 0722  BNP 1,757.0* 1,430.0* 141.0*    ProBNP (last 3 results) No results for input(s): PROBNP in the last 8760 hours.  CBG: No results for input(s): GLUCAP in the last 168 hours.  Recent Results (from the past 240 hour(s))  Blood Culture (routine x 2)     Status: None (Preliminary result)   Collection Time: 10/23/17  8:53 AM  Result Value Ref Range Status   Specimen Description BLOOD RIGH HAND  Final   Special Requests   Final    BOTTLES DRAWN AEROBIC AND ANAEROBIC Blood Culture results may not be optimal due to an inadequate volume of blood received in culture bottles   Culture NO GROWTH < 24 HOURS  Final   Report Status PENDING  Incomplete  Blood Culture (routine x 2)     Status: None (Preliminary result)   Collection Time: 10/23/17  8:53 AM  Result Value Ref Range Status   Specimen Description BLOOD RIGHT AC  Final   Special Requests   Final    BOTTLES DRAWN AEROBIC AND ANAEROBIC Blood Culture adequate volume   Culture NO GROWTH < 24 HOURS  Final   Report Status PENDING  Incomplete  Respiratory Panel by PCR     Status: None   Collection Time: 10/23/17  6:59 PM  Result Value Ref Range Status   Adenovirus NOT DETECTED NOT DETECTED Final   Coronavirus 229E NOT DETECTED NOT DETECTED Final   Coronavirus HKU1 NOT DETECTED NOT DETECTED Final   Coronavirus NL63 NOT DETECTED NOT DETECTED Final   Coronavirus OC43 NOT DETECTED NOT  DETECTED Final   Metapneumovirus NOT DETECTED NOT DETECTED Final   Rhinovirus / Enterovirus NOT DETECTED NOT DETECTED Final   Influenza A NOT DETECTED NOT DETECTED Final   Influenza B NOT DETECTED NOT DETECTED Final   Parainfluenza Virus 1 NOT DETECTED NOT DETECTED Final   Parainfluenza Virus 2 NOT DETECTED NOT DETECTED Final   Parainfluenza Virus 3 NOT DETECTED NOT DETECTED Final   Parainfluenza Virus 4 NOT DETECTED NOT DETECTED Final   Respiratory Syncytial Virus NOT DETECTED NOT DETECTED Final   Bordetella pertussis NOT DETECTED NOT DETECTED Final   Chlamydophila pneumoniae NOT DETECTED NOT DETECTED Final   Mycoplasma pneumoniae NOT DETECTED NOT DETECTED Final    Comment: Performed at [facility]  Hospital Lab, [place] [address], [place], [place] [address]     Studies: Dg Chest 1 View  Result Date: 10/23/2017 CLINICAL DATA:  Shortness of breath, COPD, smoker EXAM: CHEST 1 VIEW COMPARISON:  04/21/2017 FINDINGS: There is hyperinflation of the lungs compatible with COPD. Interstitial opacities again noted in the lower lobes, likely edema. Suspect small bilateral effusions, bibasilar atelectasis. Mild cardiomegaly. IMPRESSION: Cardiomegaly with interstitial opacities in the lower lobes, likely mild edema/ CHF. COPD. Bibasilar atelectasis, small effusions. Electronically Signed   By: Rolm Baptise M.D.   On: 10/23/2017 08:06   Ct Angio Chest Pe W And/or Wo Contrast  Result Date: 10/23/2017 CLINICAL DATA:  46 year old with respiratory distress. High pretest probability for pulmonary embolism. History sickle cell anemia. EXAM: CT ANGIOGRAPHY CHEST WITH CONTRAST TECHNIQUE: Multidetector CT imaging of the chest was performed using the standard protocol during bolus administration of intravenous contrast. Multiplanar CT image reconstructions and MIPs were obtained to evaluate the vascular anatomy. CONTRAST:  75 mL Isovue 370 COMPARISON:  Chest radiograph 10/23/2017 FINDINGS: Cardiovascular: Main pulmonary  artery is prominent for size measuring up to 3.7 cm. Pulmonary arteries are patent without pulmonary emboli. Normal caliber of the thoracic aorta. Minimal atherosclerotic calcifications at the aortic arch. Heart size is mildly enlarged. Mediastinum/Nodes: No chest lymphadenopathy. Esophagus is unremarkable. Small amount of pericardial fluid. Lungs/Pleura: No pleural effusions. Trachea and mainstem bronchi are patent. Centrilobular emphysema particularly in the right upper lobe. Linear and bandlike densities in the right middle lobe are suggestive for atelectasis or scarring. Peripheral densities in both lower lobes may also represent scarring or atelectasis. Patchy areas of ground-glass attenuation in both lungs. Upper Abdomen: The spleen is atrophic and highly dense. 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 Musculoskeletal: No suspicious bone findings. Review of the MIP images confirms the above findings. IMPRESSION: Negative for a pulmonary embolism. Centrilobular emphysema with patchy areas of ground-glass attenuation. Findings are nonspecific but could represent mild edema. Peripheral densities in the right middle lobe and lower lobes are most compatible with scarring and/or atelectasis. No large areas of airspace disease or consolidation. Enlargement of the main pulmonary artery. Findings are suggestive for pulmonary hypertension. Aortic Atherosclerosis (ICD10-I70.0). Electronically Signed   By: Markus Daft M.D.   On: 10/23/2017 08:57    Scheduled Meds: . budesonide (PULMICORT) nebulizer solution  0.25 mg Nebulization BID  . cholecalciferol  2,000 Units Oral Daily  . citalopram  10 mg Oral QHS  . digoxin  0.125 mg Oral Daily  . enoxaparin (LOVENOX) injection  40 mg Subcutaneous Q24H  . furosemide  40 mg Oral BID  . ipratropium-albuterol  3 mL Nebulization Q6H  . ketorolac  15 mg Intravenous Q6H  . loratadine  10 mg Oral Daily  . midodrine  2.5 mg Oral TID WC  .  nicotine  14 mg Transdermal Daily  . pantoprazole  40 mg Oral Daily  . sodium chloride flush  3 mL Intravenous Q12H  . spironolactone  12.5 mg Oral Daily   Continuous Infusions: . sodium chloride    . sodium chloride      Active Problems:   Pulmonary hypertension assoc with unclear multi-factorial mechanisms (HCC)   Tobacco use disorder   Intermittent palpitations   Moderate tricuspid regurgitation   Mitral regurgitation    In excess of 35 minutes spent during this visit. Greater than 50% involved face to face contact with the patient for assessment, counseling and coordination of care.

## 2017-10-24 NOTE — Progress Notes (Signed)
[facility] Consult  Physician Requesting Consult:  Liston Alba, M.D.   Date of Consult:  10/23/2017  Reason for Consult/Chief Complaint:  Acute on Chronic Hypoxic Respiratory Failure   History of Presenting Illness:  46 y.o. female with history of sickle cell anemia, cardiomyopathy, pulmonary hypertension, and suspected COPD. Patient is on 4 L/m for chronic hypoxic respiratory failure.  Her last office visit with Dr. Lamonte Sakai on 8/31 documents a walk test distance of 216 m without desaturation on 4 L/m. Additionally, the patient has been on Spiriva and Dulera for approximately 2 years with perceived symptomatic benefit. Per documentation from the emergency department the patient endorsed shortness of breath starting several hours prior to her presentation earlier the morning to outside hospitals emergency Department. EMS found the patient with saturation in the 70s on room air and was switched to a nonrebreather mask. This was subsequently weaned to 4 L/m via nasal cannula which maintained her saturation in the 80s to 90s.There is no documentation of any wheezing on auscultation in route. The patient also denied any cough or fever at outside hospital. She also denied any sick contacts at that time. Documentation by the emergency department nurse indicates a saturation of 92% on 5 L/m. Code STEMI and code sepsis were both initiated. Cardiology assessed the patient and felt the EKG changes were nonspecific and without chest pain recommended outpatient follow-up after discharge from hospital. Patient continued to have intermittent periods of hypoxia with increasing oxygen requirements prompting pulmonary consultation 10/25. Also note per documentation the patient had endorsed interim and palpitations with hot flashes for approximately 3 days. After further discussion per admitting physician patient was only hypoxic after oxygen therapy was removed. The patient and husband report that  he has had a cough but there is questionable temporal exposure as they are currently separated. She tested yesterday and developed shortness of breath. She woke up this morning again with shortness of breath as well as hot flashes. Her shortness of breath was helped by utilizing her albuterol rescue inhalation medication. She reports she has been adherent to her Spiriva and Dulera. Notably she has had to use her albuterol rescue inhaler 3 times this week which is significantly more than she routinely/normally requires.she denies any sore throat or sinus congestion. She denies any chest pain, pressure, or tightness. She does endorse a tightness over her upper abdomen as well as her lower thoracic back. She denies any subjective fever or chills otherwise. She denies any associated cough or wheezing with her dyspnea.patient has been adherent to her diuretic regimen. She reports her weight was 123.6 pounds on Tuesday and 124 pounds on Wednesday. This morning on her home scale she weighed 119.6 pounds. She has reportedly had a good appetite and adequate oral intake.   Subjective:  Pt reports feeling better.  Denies increased SOB.  States she thinks she is better because she isn't smoking currently.  O2 at 3.5L  Temp:  [98.1 F (36.7 C)-99.5 F (37.5 C)] 98.7 F (37.1 C) (10/26 0509) Pulse Rate:  [73-112] 95 (10/26 1050) Resp:  [14-20] 18 (10/26 0509) BP: (84-116)/(40-71) 91/54 (10/26 0509) SpO2:  [84 %-95 %] 94 % (10/26 1050) Weight:  [119 lb 9.6 oz (54.3 kg)] 119 lb 9.6 oz (54.3 kg) (10/25 1203)  General: thin adult female in NAD, lying in bed  HEENT: MM pink/moist, no jvd PSY: calm/appropriate Neuro: AAOx4, speech clear, MAe  CV: s1s2 rrr, no m/r/g PULM: even/non-labored, lungs bilaterally clear, basilar crackles bilaterally  ML:YYTK, non-tender, bsx4 active  Extremities: warm/dry, no edema  Skin: no rashes or lesions  CBC Latest Ref Rng & Units 10/23/2017 10/23/2017 04/20/2017  WBC 4.0 - 10.5  K/uL 19.7(H) 19.7(H) 10.2  Hemoglobin 12.0 - 15.0 g/dL 11.0(L) 11.7(L) 8.4(L)  Hematocrit 36.0 - 46.0 % 30.9(L) 33.3(L) 24.3(L)  Platelets 150 - 400 K/uL 496(H) 489(H) 273    BMP Latest Ref Rng & Units 10/23/2017 10/23/2017 05/01/2017  Glucose 65 - 99 mg/dL - 108(H) 107(H)  BUN 6 - 20 mg/dL - 31(H) 27(H)  Creatinine 0.44 - 1.00 mg/dL 1.13(H) 1.28(H) 1.03  Sodium 135 - 145 mmol/L - 137 137  Potassium 3.5 - 5.1 mmol/L - 3.9 4.0  Chloride 101 - 111 mmol/L - 100(L) 95(L)  CO2 22 - 32 mmol/L - 26 35(H)  Calcium 8.9 - 10.3 mg/dL - 10.2 9.7    IMAGING/STUDIES: ABG on Door ??L/M 03/12/17:  7.32 / 40 / 61 / 89% TTE 04/14/17:  LV mildly dilated with EF 75%. Grade 1 diastolic dysfunction. RV severely dilated. Pulmonary artery systolic pressure 65 mmHg. No significant aortic regurgitation. Moderate mitral regurgitation. Moderate pulmonic regurgitation. Severe tricuspid regurgitation. Trivial pericardial effusion posterior to the heart. PFT 08/29/17: FVC 2.31 L (91%) FEV1 1.10 L (53%) FEV1/FVC 0.48 FEF 25-75 0.43 L (80%) negative bronchodilator response TLC 4.32 L (97%) RV 128% DLCO corrected 23% CTA CHEST 10/25:  Personally reviewed by me. No pulmonary emboli. No pathologic mediastinal adenopathy. No pericardial effusion. No pleural effusion or thickening. No parenchymal mass or opacity appreciated. Centrilobular emphysema noted throughout both lungs without a clear predilection for the apices.  SEROLOGIES (05/01/17): ANA:  Negative RA:  <14 ESR:  1  MICROBIOLOGY: Blood Cultures x2 10/25 >>>  ANTIBIOTICS: Azithromycin 10/25 (x1 dose)  ASSESSMENT/PLAN:  46 y.o. female with underlying moderate-severe COPD based upon spirometry from August. At that time she also had evidence of air trapping with her lung volumes. No substantial/severe decrease in her carbon monoxide diffusion capacity is likely due to at least in part to her significant underlying centrilobular emphysema. This appears not only in the  apices but also the bases. She does have ongoing tobacco use which is of great concern given the severity of her lung disease and comorbidities. She does seem somewhat dehydrated and hemoconcentrated reviewing her cell count today. With her hot flashes I cannot totally discount the possibility of an infectious process or viral prodrome given her upper abdominal discomfort. As such, I'm investigating for further infectious etiologies to her acute decompensation. With her social situation anxiety is certainly a potential contributing factor.  Acute on chronic hypoxic respiratory failure - baseline 4L O2.  P: O2 to support saturations >90% Droplet precautions until viral panel returned Pulmonary hygiene - IS, mobilize   Moderate-severe COPD with centrilobular emphysema P: Follow up Alpha-1  Continue Duoneb Q6 + Pulmicort BID  Hold home spiriva / dulera while inpatient  Monitor off steroids / abx for now Will need pulmonary follow up with Pulmonary post discharge (arranged)  Pulmonary hypertension  P: Hold zaroxolyn  Continue lasix / aldactone  Gentle IVF, NS at 50 ml/hr given recent dye load > follow up labs 12/26 (none from this am)   Tobacco use disorder P:  Smoking cessation counseling (repeated 10/26 per NP) Nicotine replacement patch   Remainder of care as per primary service and other consultants.  Noe Gens, NP-C [facility] Pgr: [phone] or if no answer [phone] 10/24/2017, 11:13 AM

## 2017-10-25 LAB — CBC WITH DIFFERENTIAL/PLATELET
Basophils Absolute: 0 10*3/uL (ref 0.0–0.1)
Basophils Relative: 0 %
EOS PCT: 1 %
Eosinophils Absolute: 0.2 10*3/uL (ref 0.0–0.7)
HEMATOCRIT: 26.3 % — AB (ref 36.0–46.0)
Hemoglobin: 9.4 g/dL — ABNORMAL LOW (ref 12.0–15.0)
LYMPHS ABS: 5.6 10*3/uL — AB (ref 0.7–4.0)
Lymphocytes Relative: 28 %
MCH: 36 pg — ABNORMAL HIGH (ref 26.0–34.0)
MCHC: 35.7 g/dL (ref 30.0–36.0)
MCV: 100.8 fL — AB (ref 78.0–100.0)
MONOS PCT: 9 %
Monocytes Absolute: 1.8 10*3/uL — ABNORMAL HIGH (ref 0.1–1.0)
Neutro Abs: 12.4 10*3/uL — ABNORMAL HIGH (ref 1.7–7.7)
Neutrophils Relative %: 62 %
Platelets: 380 10*3/uL (ref 150–400)
RBC: 2.61 MIL/uL — AB (ref 3.87–5.11)
RDW: 21.8 % — AB (ref 11.5–15.5)
WBC: 20 10*3/uL — AB (ref 4.0–10.5)

## 2017-10-25 LAB — BASIC METABOLIC PANEL
Anion gap: 10 (ref 5–15)
BUN: 47 mg/dL — AB (ref 6–20)
CO2: 22 mmol/L (ref 22–32)
Calcium: 9.3 mg/dL (ref 8.9–10.3)
Chloride: 110 mmol/L (ref 101–111)
Creatinine, Ser: 1.33 mg/dL — ABNORMAL HIGH (ref 0.44–1.00)
GFR calc Af Amer: 55 mL/min — ABNORMAL LOW (ref >60)
GFR calc non Af Amer: 47 mL/min — ABNORMAL LOW (ref >60)
Glucose, Bld: 97 mg/dL (ref 65–99)
Potassium: 3.9 mmol/L (ref 3.5–5.1)
Sodium: 142 mmol/L (ref 135–145)

## 2017-10-25 MED ORDER — NICOTINE 14 MG/24HR TD PT24: 14.0000 mg | MEDICATED_PATCH | Freq: Every day | TRANSDERMAL | 0 refills | Status: AC

## 2017-10-25 NOTE — Discharge Summary (Signed)
Physician Discharge Summary  Patient ID: Shannon Schneider MRN: [id number] DOB/AGE: Nov 07, 1971 46 y.o.  Admit date: 10/23/2017 Discharge date: 10/25/2017  Admission Diagnoses:  Discharge Diagnoses:  Active Problems:   Pulmonary hypertension assoc with unclear multi-factorial mechanisms (HCC)   Tobacco use disorder   Intermittent palpitations   Moderate tricuspid regurgitation   Mitral regurgitation   AKI (acute kidney injury) ([place])   Dehydration   Leukocytosis   Discharged Condition: good  Hospital Course: Patient admitted with hypoxemia and known history of Pulmonary Hypertension which has been stable. Has ARF as well as as Diastolic dysfunction. She has COPD with mild exacerbation. She has Sickle cell painful crisis treated with IV Dilaudid with Toradol and IVF. She has improved and was transitioned to home regimen. She will follow up with her PCP and cardiologist.  Consults: None  Significant Diagnostic Studies: labs: Serial CBCs and CMPs checked. 2D echo  Treatments: IV hydration and analgesia: acetaminophen and Dilaudid  Discharge Exam: Blood pressure 90/64, pulse 68, temperature 98 F (36.7 C), temperature source Oral, resp. rate 20, height 5' (1.524 m), weight 56.6 kg (124 lb 12.5 oz), last menstrual period 10/09/2017, SpO2 97 %. General appearance: alert, cooperative, appears stated age and no distress Head: Normocephalic, without obvious abnormality, atraumatic Eyes: conjunctivae/corneas clear. PERRL, EOM's intact. Fundi benign. Neck: no adenopathy, no carotid bruit, no JVD, supple, symmetrical, trachea midline and thyroid not enlarged, symmetric, no tenderness/mass/nodules Back: symmetric, no curvature. ROM normal. No CVA tenderness. Resp: clear to auscultation bilaterally Chest wall: no tenderness Cardio: regular rate and rhythm, S1, S2 normal, no murmur, click, rub or gallop GI: soft, non-tender; bowel sounds normal; no masses,  no organomegaly Extremities: extremities  normal, atraumatic, no cyanosis or edema Pulses: 2+ and symmetric Skin: Skin color, texture, turgor normal. No rashes or lesions Neurologic: Grossly normal  Disposition: 02-Transferred to [facility]  Discharge Instructions    Diet - low sodium heart healthy    Complete by:  As directed    Discharge instructions    Complete by:  As directed    Patient should hold Zaroxylene and Lasix for another day. Resume on Lasix tomorrow and follow up with PCP for BMP on Monday   Increase activity slowly    Complete by:  As directed      Allergies as of 10/25/2017      Reactions   Symbicort [budesonide-formoterol Fumarate]    Rash on tongue      Medication List    STOP taking these medications   metolazone 2.5 MG tablet Commonly known as:  ZAROXOLYN     TAKE these medications   albuterol 108 (90 Base) MCG/ACT inhaler Commonly known as:  PROVENTIL HFA;VENTOLIN HFA Inhale 2 puffs into the lungs every 6 (six) hours as needed for wheezing.   citalopram 10 MG tablet Commonly known as:  CELEXA Take 10 mg by mouth at bedtime.   digoxin 0.125 MG tablet Commonly known as:  LANOXIN Take 1 tablet (0.125 mg total) by mouth daily.   Ergocalciferol 2000 units Tabs Take 2,000 Units by mouth daily.   furosemide 40 MG tablet Commonly known as:  LASIX Take 1 tablet (40 mg total) by mouth 2 (two) times daily.   loratadine 10 MG tablet Commonly known as:  CLARITIN Take 10 mg by mouth daily.   midodrine 2.5 MG tablet Commonly known as:  PROAMATINE Take 1 tablet (2.5 mg total) by mouth 3 (three) times daily with meals.   mometasone-formoterol 200-5 MCG/ACT Aero Commonly  known as:  DULERA Inhale 2 puffs into the lungs 2 (two) times daily.   nicotine 14 mg/24hr patch Commonly known as:  NICODERM CQ - dosed in mg/24 hours Place 1 patch (14 mg total) onto the skin daily.   OXYGEN Inhale 4 L into the lungs continuous.   pantoprazole 40 MG tablet Commonly known as:   PROTONIX Take 40 mg by mouth daily.   SPIRIVA RESPIMAT 2.5 MCG/ACT Aers Generic drug:  Tiotropium Bromide Monohydrate Inhale 1 puff into the lungs 2 (two) times daily.   spironolactone 25 MG tablet Commonly known as:  ALDACTONE Take 12.5 mg by mouth daily.      Follow-up Information    Parrett, Fonnie Mu, NP Follow up on 11/06/2017.   Specialty:  Pulmonary Disease Why:  Appt at 11 AM.  Please arrive at 10:45 AM for check in. Contact information: [address] [address] [phone]           Signed: Barbette Merino 10/25/2017, 8:19 AM   Time spent: 34 minutes

## 2017-10-27 LAB — ALPHA-1 ANTITRYPSIN PHENOTYPE: A-1 Antitrypsin, Ser: 143 mg/dL (ref 90–200)

## 2017-10-28 LAB — CULTURE, BLOOD (ROUTINE X 2)
CULTURE: NO GROWTH
Culture: NO GROWTH
Special Requests: ADEQUATE

## 2017-11-06 ENCOUNTER — Inpatient Hospital Stay: Payer: BLUE CROSS/BLUE SHIELD | Admitting: Adult Health

## 2017-11-14 ENCOUNTER — Ambulatory Visit: Payer: BLUE CROSS/BLUE SHIELD | Admitting: Adult Health

## 2017-11-14 ENCOUNTER — Encounter: Payer: Self-pay | Admitting: Adult Health

## 2017-11-14 DIAGNOSIS — Z23 Encounter for immunization: Secondary | ICD-10-CM | POA: Diagnosis not present

## 2017-11-14 DIAGNOSIS — J449 Chronic obstructive pulmonary disease, unspecified: Secondary | ICD-10-CM

## 2017-11-14 DIAGNOSIS — I2729 Other secondary pulmonary hypertension: Secondary | ICD-10-CM

## 2017-11-14 DIAGNOSIS — J9611 Chronic respiratory failure with hypoxia: Secondary | ICD-10-CM | POA: Diagnosis not present

## 2017-11-14 NOTE — Assessment & Plan Note (Signed)
Cont to follow  o2 to keep sats >88%.  Cont on Lasix .  6 min walk test on return   Plan  Patient Instructions  Continue on Dulera and Spiriva .  Continue on Oxygen 4l/m .  Work on not smoking.  Flu shot today .  follow up with Dr. Lamonte Sakai in 3 months with 6 min walk.  Please contact office for sooner follow up if symptoms do not improve or worsen or seek emergency care

## 2017-11-14 NOTE — Assessment & Plan Note (Signed)
Recent flare now resolving  Smoking cessation   Plan  Patient Instructions  Continue on Dulera and Spiriva .  Continue on Oxygen 4l/m .  Work on not smoking.  Flu shot today .  follow up with Dr. Lamonte Sakai in 3 months with 6 min walk.  Please contact office for sooner follow up if symptoms do not improve or worsen or seek emergency care

## 2017-11-14 NOTE — Progress Notes (Signed)
@Patient  ID: Shannon Schneider, female    DOB: [birth year], 46 y.o.   MRN: [id number]  Chief Complaint  Patient presents with  . Follow-up    COPD     Referring provider: Danelle Berry, NP  HPI: 46 yo female former smoker -(04/14/17) seen for PCCM consult 04/14/17 at [facility] for acute resp failure with CHF exacerbation .  Has sickle cell anemia , COPD on O2 home .  Cardiomyopathy  severe pulmonary HTN and severe MR/TR. -F/by Dr. Humphrey Rolls in [place] .   TEST  03/2015 Echo >EF 35-40%, Mod MV regur . 03/2016 Echo EF 75%, sever TV regurg. Mod MV regurg, PAP 54mHg.  04/14/17 >ven dopplers >neg for DVT.  TTE 04/14/17:  LV mildly dilated with EF 75%. Grade 1 diastolic dysfunction. RV severely dilated. Pulmonary artery systolic pressure 65 mmHg. No significant aortic regurgitation. Moderate mitral regurgitation. Moderate pulmonic regurgitation. Severe tricuspid regurgitation. Trivial pericardial effusion posterior to the heart. PFT 08/29/17: FVC 2.31 L (91%) FEV1 1.10 L (53%) FEV1/FVC 0.48 FEF 25-75 0.43 L (80%) negative bronchodilator response TLC 4.32 L (97%) RV 128% DLCO corrected 23% CTA CHEST 10/23/17   Personally reviewed by me. No pulmonary emboli. No pathologic mediastinal adenopathy. No pericardial effusion. No pleural effusion or thickening. No parenchymal mass or opacity appreciated. Centrilobular emphysema noted throughout both lungs without a clear predilection for the apices.  SEROLOGIES (05/01/17): ANA:  Negative RA:  <14 ESR:  1 Alpha 1 MM 143    11/14/2017 Post hospital follow up : COPD , Pulmonary HTN , O2 RF  Patient presents for a follow-up from recent hospital admission.  Patient was admitted 2 weeks ago with COPD exacerbation and sickle cell pain crisis.  She was treated with IV hydration and analgesia with Dilaudid. Since discharge she is feeling better. Cough is resolved. Dyspnea has returned to baseline . Gets winded with prolonged walking . Able to do light house work.    Remains on Dulera and Spiriva .Says insurance covers well.   Pt continues to smoke, discussed cessation . Remains on O2 at 4l/m .     Allergies  Allergen Reactions  . Symbicort [Budesonide-Formoterol Fumarate]     Rash on tongue    Immunization History  Administered Date(s) Administered  . Influenza [place] 09/29/2016    Past Medical History:  Diagnosis Date  . Atrial fibrillation ([place]   . Chronic respiratory failure with hypoxia ([place]   . Congestive heart failure ([place]F) ([place] 06/27/15  . COPD (chronic obstructive pulmonary disease) ([facility] 06/27/15  . Emphysema of lung ([place]   . Hyperbilirubinemia   . Mitral regurgitation   . Moderate tricuspid regurgitation   . 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 Pulmonary hypertension assoc with unclear multi-factorial mechanisms ([facility] 2013  . Rectal mass   . Sickle cell anemia (HCC)   . Tobacco use disorder     Tobacco History: Social History   Tobacco Use  Smoking Status Current Every Day Smoker  . Packs/day: 0.25  . Years: 32.00  . Pack years: 8.00  . Types: Cigarettes  . Start date: 10/30/1984  Smokeless Tobacco Never Used  Tobacco Comment   Stopped for 9 months w/ pregnancy - peak rate 1ppd   Ready to quit: No Counseling given: Yes Comment: Stopped for 9 months w/ pregnancy - peak rate 1ppd   Outpatient Encounter Medications as of 11/14/2017  Medication Sig  . albuterol (PROVENTIL HFA;VENTOLIN HFA) 108 (90 Base) MCG/ACT inhaler Inhale 2 puffs into the lungs every 6 (  six) hours as needed for wheezing.  . citalopram (CELEXA) 10 MG tablet Take 10 mg by mouth at bedtime.  . clonazePAM (KLONOPIN) 0.5 MG tablet 1/2 tab by mouth every 12 hours as needed for anxiety/panic attacks  . digoxin (LANOXIN) 0.125 MG tablet Take 1 tablet (0.125 mg total) by mouth daily.  . Ergocalciferol 2000 units TABS Take 2,000 Units by mouth daily.   . furosemide (LASIX) 40 MG tablet Take 1 tablet (40 mg total) by mouth 2 (two) times daily.  Marland Kitchen loratadine  (CLARITIN) 10 MG tablet Take 10 mg by mouth daily.  . midodrine (PROAMATINE) 2.5 MG tablet Take 1 tablet (2.5 mg total) by mouth 3 (three) times daily with meals.  . mometasone-formoterol (DULERA) 200-5 MCG/ACT AERO Inhale 2 puffs into the lungs 2 (two) times daily.  . nicotine (NICODERM CQ - DOSED IN MG/24 HOURS) 14 mg/24hr patch Place 1 patch (14 mg total) onto the skin daily.  . OXYGEN Inhale 4 L into the lungs continuous.   . pantoprazole (PROTONIX) 40 MG tablet Take 40 mg by mouth daily.  Marland Kitchen spironolactone (ALDACTONE) 25 MG tablet Take 12.5 mg by mouth daily.   . Tiotropium Bromide Monohydrate (SPIRIVA RESPIMAT) 2.5 MCG/ACT AERS Inhale 1 puff into the lungs 2 (two) times daily.   No facility-administered encounter medications on file as of 11/14/2017.      Review of Systems  Constitutional:   No  weight loss, night sweats,  Fevers, chills, +fatigue, or  lassitude.  HEENT:   No headaches,  Difficulty swallowing,  Tooth/dental problems, or  Sore throat,                No sneezing, itching, ear ache, nasal congestion, post nasal drip,   CV:  No chest pain,  Orthopnea, PND, swelling in lower extremities, anasarca, dizziness, palpitations, syncope.   GI  No heartburn, indigestion, abdominal pain, nausea, vomiting, diarrhea, change in bowel habits, loss of appetite, bloody stools.   Resp:   No chest wall deformity  Skin: no rash or lesions.  GU: no dysuria, change in color of urine, no urgency or frequency.  No flank pain, no hematuria   MS:  No joint pain or swelling.  No decreased range of motion.  No back pain.    Physical Exam  BP 128/76 (BP Location: Left Arm, Cuff Size: Small)   Pulse 82   Ht 5' (1.524 m)   Wt 128 lb (58.1 kg)   SpO2 97%   BMI 25.00 kg/m   GEN: A/Ox3; pleasant , NAD, thin chronically ill appearing in wc on o2    HEENT:  [place]/AT,  EACs-clear, TMs-wnl, NOSE-clear, THROAT-clear, no lesions, no postnasal drip or exudate noted.   NECK:  Supple w/ fair  ROM; no JVD; normal carotid impulses w/o bruits; no thyromegaly or nodules palpated; no lymphadenopathy.    RESP  Clear  P & A; w/o, wheezes/ rales/ or rhonchi. no accessory muscle use, no dullness to percussion  CARD:  RRR, no m/r/g, no peripheral edema, pulses intact, no cyanosis or clubbing.  GI:   Soft & nt; nml bowel sounds; no organomegaly or masses detected.   Musco: Warm bil, no deformities or joint swelling noted.   Neuro: alert, no focal deficits noted.    Skin: Warm, no lesions or rashes    Lab Results:  CBC  BMET  BNP  ProBNP No results found for: PROBNP  Imaging: Dg Chest 1 View  Result Date: 10/23/2017 CLINICAL DATA:  Shortness  of breath, COPD, smoker EXAM: CHEST 1 VIEW COMPARISON:  04/21/2017 FINDINGS: There is hyperinflation of the lungs compatible with COPD. Interstitial opacities again noted in the lower lobes, likely edema. Suspect small bilateral effusions, bibasilar atelectasis. Mild cardiomegaly. IMPRESSION: Cardiomegaly with interstitial opacities in the lower lobes, likely mild edema/ CHF. COPD. Bibasilar atelectasis, small effusions. Electronically Signed   By: Rolm Baptise M.D.   On: 10/23/2017 08:06   Ct Angio Chest Pe W And/or Wo Contrast  Result Date: 10/23/2017 CLINICAL DATA:  46 year old with respiratory distress. High pretest probability for pulmonary embolism. History sickle cell anemia. EXAM: CT ANGIOGRAPHY CHEST WITH CONTRAST TECHNIQUE: Multidetector CT imaging of the chest was performed using the standard protocol during bolus administration of intravenous contrast. Multiplanar CT image reconstructions and MIPs were obtained to evaluate the vascular anatomy. CONTRAST:  75 mL Isovue 370 COMPARISON:  Chest radiograph 10/23/2017 FINDINGS: Cardiovascular: Main pulmonary artery is prominent for size measuring up to 3.7 cm. Pulmonary arteries are patent without pulmonary emboli. Normal caliber of the thoracic aorta. Minimal atherosclerotic  calcifications at the aortic arch. Heart size is mildly enlarged. Mediastinum/Nodes: No chest lymphadenopathy. Esophagus is unremarkable. Small amount of pericardial fluid. Lungs/Pleura: No pleural effusions. Trachea and mainstem bronchi are patent. Centrilobular emphysema particularly in the right upper lobe. Linear and bandlike densities in the right middle lobe are suggestive for atelectasis or scarring. Peripheral densities in both lower lobes may also represent scarring or atelectasis. Patchy areas of ground-glass attenuation in both lungs. Upper Abdomen: The spleen is atrophic and highly dense. 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 Musculoskeletal: No suspicious bone findings. Review of the MIP images confirms the above findings. IMPRESSION: Negative for a pulmonary embolism. Centrilobular emphysema with patchy areas of ground-glass attenuation. Findings are nonspecific but could represent mild edema. Peripheral densities in the right middle lobe and lower lobes are most compatible with scarring and/or atelectasis. No large areas of airspace disease or consolidation. Enlargement of the main pulmonary artery. Findings are suggestive for pulmonary hypertension. Aortic Atherosclerosis (ICD10-I70.0). Electronically Signed   By: Markus Daft M.D.   On: 10/23/2017 08:57     Assessment & Plan:   Moderate COPD (chronic obstructive pulmonary disease) (HCC) Recent flare now resolving  Smoking cessation   Plan  Patient Instructions  Continue on Dulera and Spiriva .  Continue on Oxygen 4l/m .  Work on not smoking.  Flu shot today .  follow up with Dr. Lamonte Sakai in 3 months with 6 min walk.  Please contact office for sooner follow up if symptoms do not improve or worsen or seek emergency care       Chronic respiratory failure with hypoxia ([place]) Cont on o2 , goal to keep O2 88-90%.   Pulmonary hypertension assoc with unclear multi-factorial mechanisms ([place]) Cont to  follow  o2 to keep sats >88%.  Cont on Lasix .  6 min walk test on return   Plan  Patient Instructions  Continue on Dulera and Spiriva .  Continue on Oxygen 4l/m .  Work on not smoking.  Flu shot today .  follow up with Dr. Lamonte Sakai in 3 months with 6 min walk.  Please contact office for sooner follow up if symptoms do not improve or worsen or seek emergency care           Rexene Edison, NP 11/14/2017

## 2017-11-14 NOTE — Patient Instructions (Addendum)
Continue on Dulera and Spiriva .  Continue on Oxygen 4l/m .  Work on not smoking.  Flu shot today .  follow up with Dr. Lamonte Sakai in 3 months with 6 min walk.  Please contact office for sooner follow up if symptoms do not improve or worsen or seek emergency care

## 2017-11-14 NOTE — Assessment & Plan Note (Signed)
Cont on o2 , goal to keep O2 88-90%.

## 2017-11-17 NOTE — Addendum Note (Signed)
Addended by: Parke Poisson E on: 11/17/2017 11:32 AM   Modules accepted: Orders

## 2017-12-12 IMAGING — CT CT ANGIO CHEST
2 of 6 series · 18 of 46 positions shown · IV contrast (APPLIED)
Comparison: Chest radiograph 10/23/2017

CLINICAL DATA: 45-year-old with respiratory distress. High pretest
probability for pulmonary embolism. History sickle cell anemia.

EXAM:
CT ANGIOGRAPHY CHEST WITH CONTRAST
TECHNIQUE: Multidetector CT imaging of the chest was performed using the
standard protocol during bolus administration of intravenous
contrast. Multiplanar CT image reconstructions and MIPs were
obtained to evaluate the vascular anatomy.
CONTRAST:  75 mL Isovue 370

[Series 5: thins · axial · 0.69mm/px · z∈[-1133,-912]mm · 16 of 243 slices shown]
[im 11/243  lung]
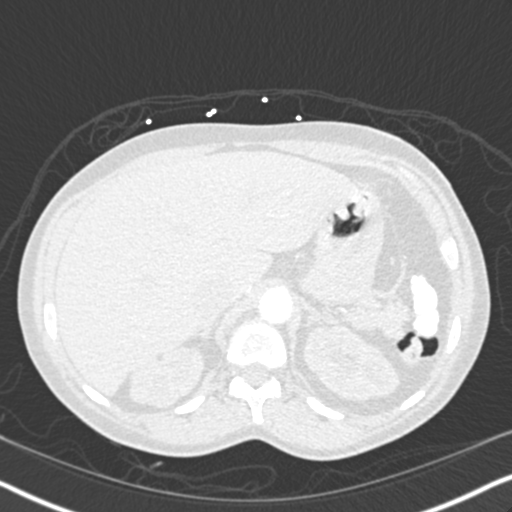
[im 32/243  soft-tissue]
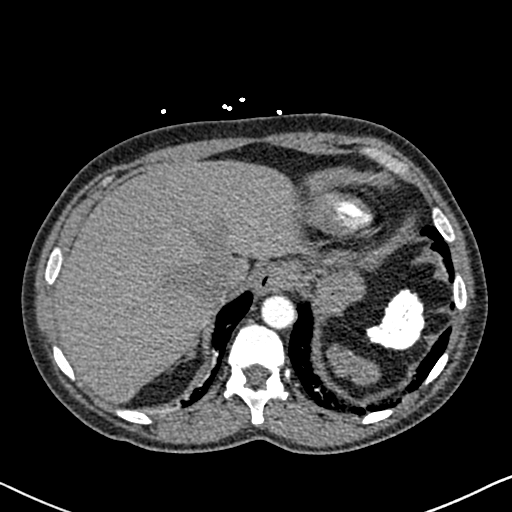
[im 43/243  lung]
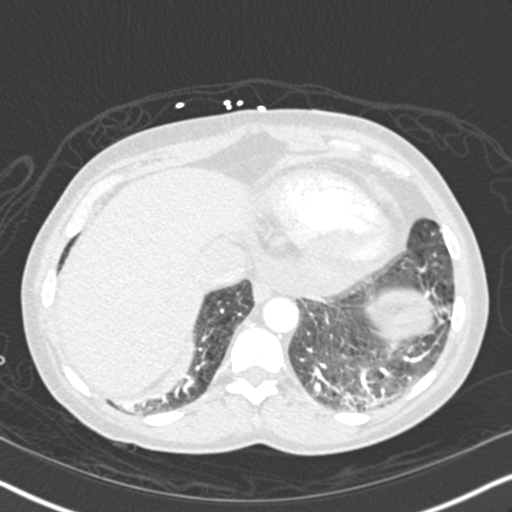
[im 53/243  soft-tissue]
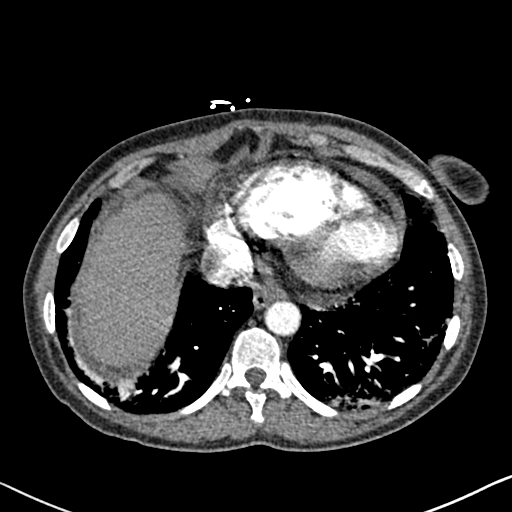
[im 74/243  lung]
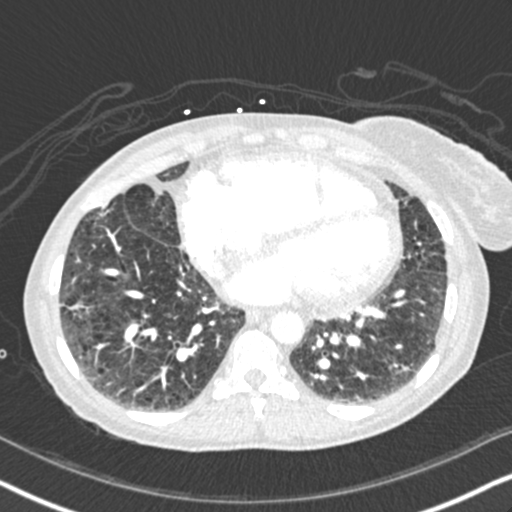
[im 85/243  soft-tissue]
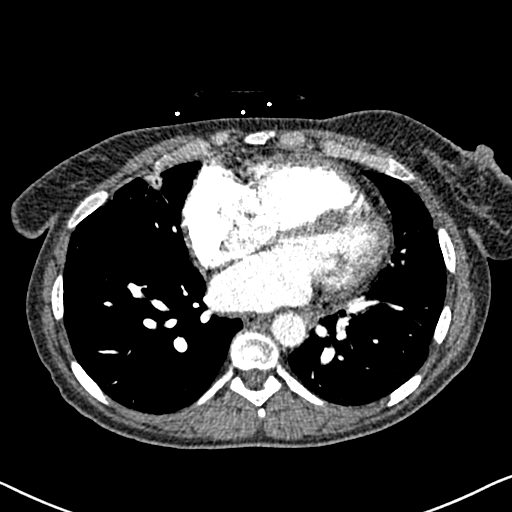
[im 95/243  lung]
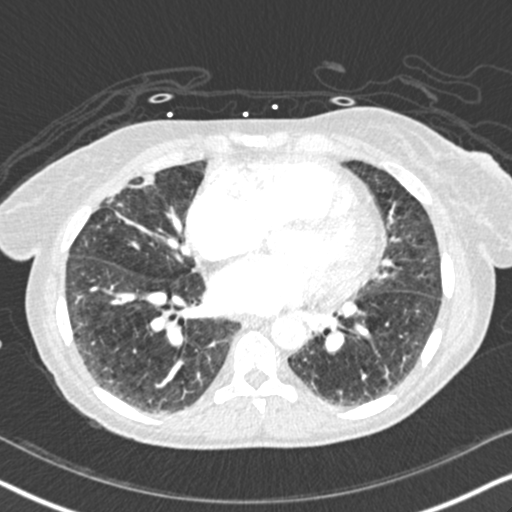
[im 116/243  soft-tissue]
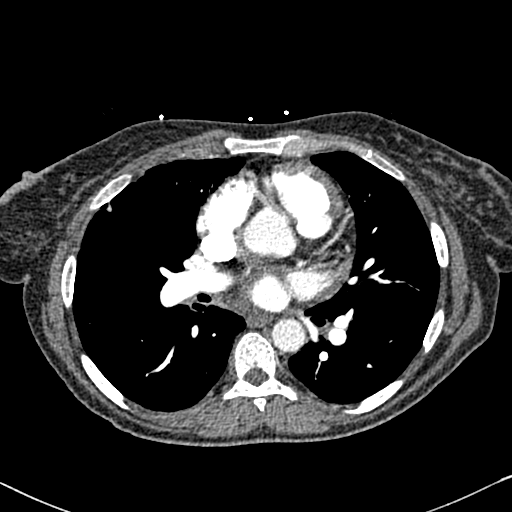
[im 127/243  lung]
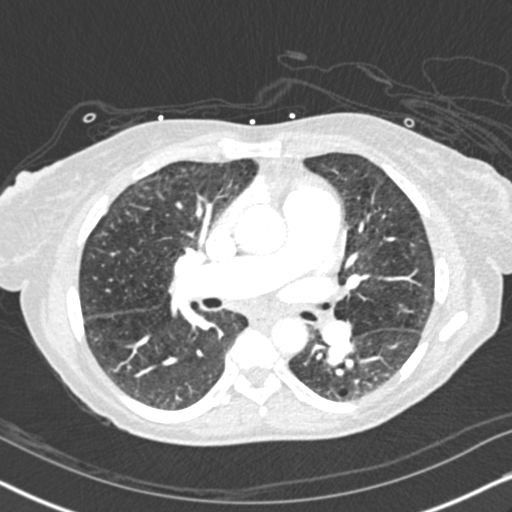
[im 148/243  soft-tissue]
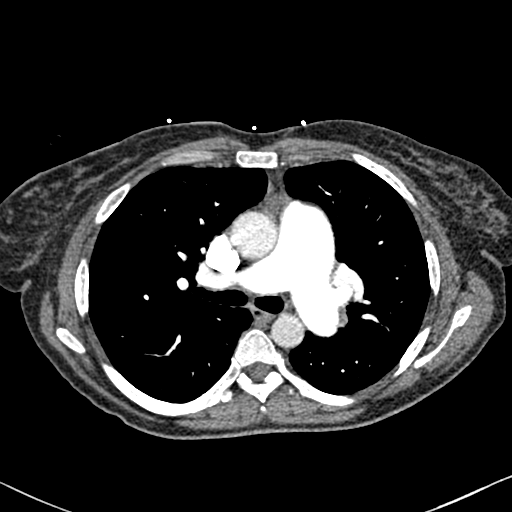
[im 158/243  lung]
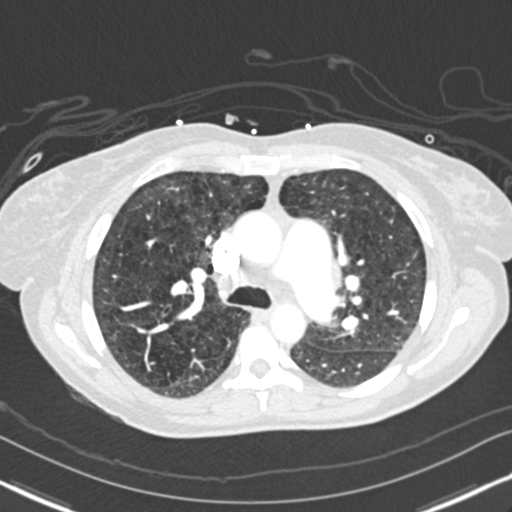
[im 169/243  soft-tissue]
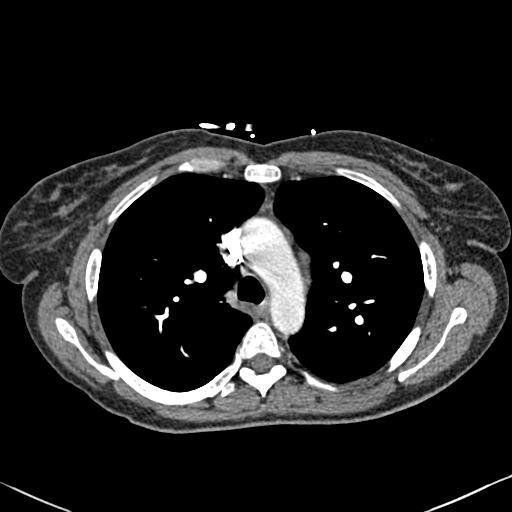
[im 190/243  lung]
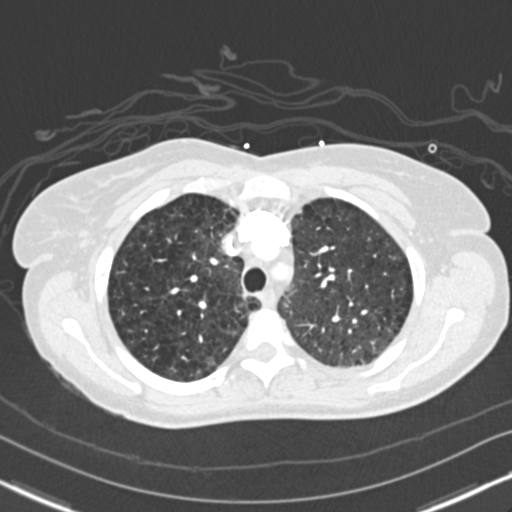
[im 200/243  soft-tissue]
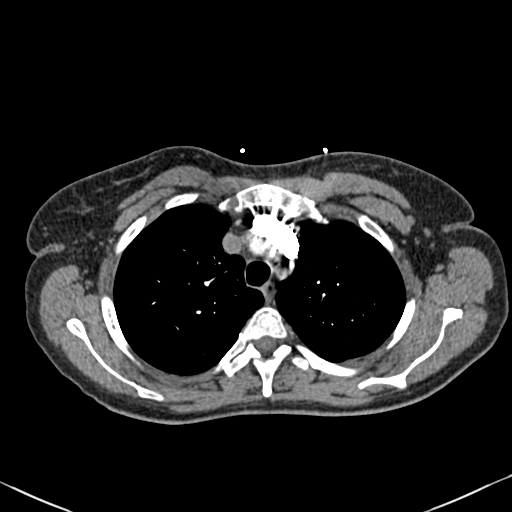
[im 211/243  lung]
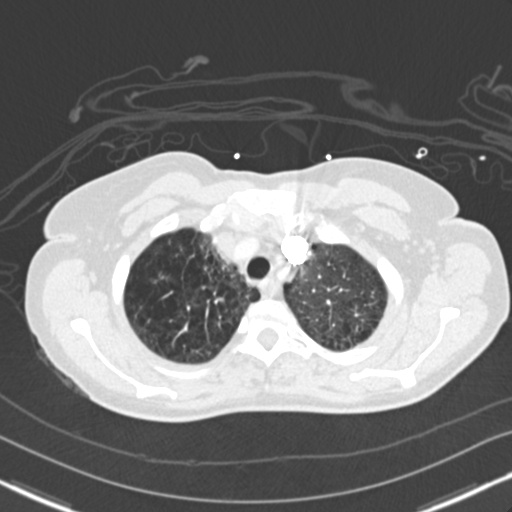
[im 232/243  soft-tissue]
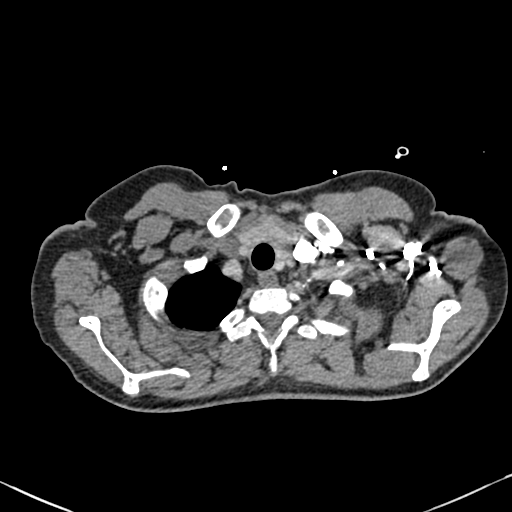

[Series 7: coronal mpr · coronal · 0.50mm/px · 2 of 80 slices shown]
[im 27/80  soft-tissue]
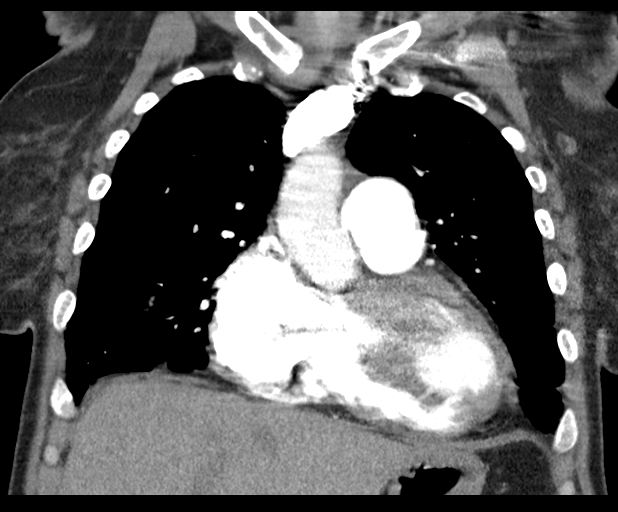
[im 53/80  soft-tissue]
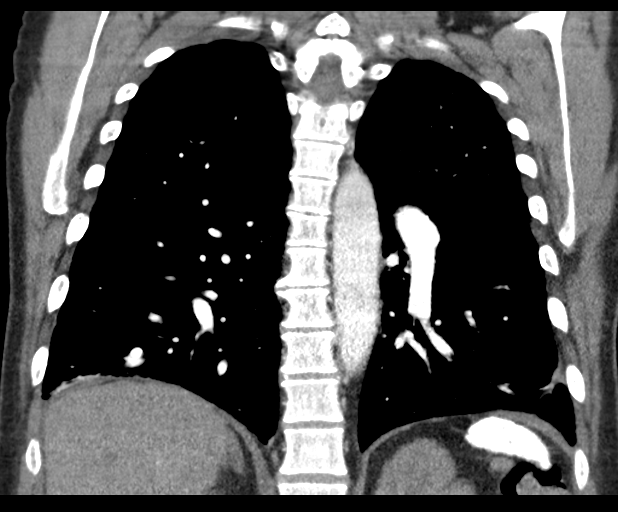

[18 of 46 positions shown; findings below may reference images not displayed]

FINDINGS: Cardiovascular: Main pulmonary artery is prominent for size
measuring up to 3.7 cm. Pulmonary arteries are patent without
pulmonary emboli. Normal caliber of the thoracic aorta. Minimal
atherosclerotic calcifications at the aortic arch. Heart size is
mildly enlarged.

Mediastinum/Nodes: No chest lymphadenopathy. Esophagus is
unremarkable. Small amount of pericardial fluid.

Lungs/Pleura: No pleural effusions. Trachea and mainstem bronchi are
patent. Centrilobular emphysema particularly in the right upper
lobe. Linear and bandlike densities in the right middle lobe are
suggestive for atelectasis or scarring. Peripheral densities in both
lower lobes may also represent scarring or atelectasis. Patchy areas
of ground-glass attenuation in both lungs.

Upper Abdomen: The spleen is atrophic and highly dense. Findings are
compatible with auto infarction related to sickle cell anemia.

Musculoskeletal: No suspicious bone findings.

Review of the MIP images confirms the above findings.
IMPRESSION: Negative for a pulmonary embolism.

Centrilobular emphysema with patchy areas of ground-glass
attenuation. Findings are nonspecific but could represent mild
edema.

Peripheral densities in the right middle lobe and lower lobes are
most compatible with scarring and/or atelectasis. No large areas of
airspace disease or consolidation.

Enlargement of the main pulmonary artery. Findings are suggestive
for pulmonary hypertension.

Aortic Atherosclerosis ([id number]-ZJ4.4).

## 2017-12-30 DEATH — deceased
